# Patient Record
Sex: Female | Born: 1935 | Race: White | Hispanic: No | Smoking: Former smoker
Health system: Southern US, Community
[De-identification: ages and names within clinical notes are randomized; demographics above are authoritative.]

## PROBLEM LIST (undated history)

## (undated) DIAGNOSIS — M549 Dorsalgia, unspecified: Secondary | ICD-10-CM

## (undated) DIAGNOSIS — N8110 Cystocele, unspecified: Secondary | ICD-10-CM

## (undated) DIAGNOSIS — H35039 Hypertensive retinopathy, unspecified eye: Secondary | ICD-10-CM

## (undated) DIAGNOSIS — K219 Gastro-esophageal reflux disease without esophagitis: Secondary | ICD-10-CM

## (undated) DIAGNOSIS — K649 Unspecified hemorrhoids: Secondary | ICD-10-CM

## (undated) DIAGNOSIS — T7840XA Allergy, unspecified, initial encounter: Secondary | ICD-10-CM

## (undated) DIAGNOSIS — N289 Disorder of kidney and ureter, unspecified: Secondary | ICD-10-CM

## (undated) DIAGNOSIS — K8020 Calculus of gallbladder without cholecystitis without obstruction: Secondary | ICD-10-CM

## (undated) DIAGNOSIS — M1990 Unspecified osteoarthritis, unspecified site: Secondary | ICD-10-CM

## (undated) DIAGNOSIS — H3530 Unspecified macular degeneration: Secondary | ICD-10-CM

## (undated) DIAGNOSIS — K5792 Diverticulitis of intestine, part unspecified, without perforation or abscess without bleeding: Secondary | ICD-10-CM

## (undated) DIAGNOSIS — N3021 Other chronic cystitis with hematuria: Secondary | ICD-10-CM

## (undated) DIAGNOSIS — R7301 Impaired fasting glucose: Secondary | ICD-10-CM

## (undated) DIAGNOSIS — D034 Melanoma in situ of scalp and neck: Secondary | ICD-10-CM

## (undated) DIAGNOSIS — E785 Hyperlipidemia, unspecified: Secondary | ICD-10-CM

## (undated) DIAGNOSIS — K259 Gastric ulcer, unspecified as acute or chronic, without hemorrhage or perforation: Secondary | ICD-10-CM

## (undated) DIAGNOSIS — K635 Polyp of colon: Secondary | ICD-10-CM

## (undated) DIAGNOSIS — I10 Essential (primary) hypertension: Secondary | ICD-10-CM

## (undated) DIAGNOSIS — K2970 Gastritis, unspecified, without bleeding: Secondary | ICD-10-CM

## (undated) DIAGNOSIS — H9190 Unspecified hearing loss, unspecified ear: Secondary | ICD-10-CM

## (undated) HISTORY — PX: CHOLECYSTECTOMY: SHX55

## (undated) HISTORY — DX: Unspecified macular degeneration: H35.30

## (undated) HISTORY — PX: EYE SURGERY: SHX253

## (undated) HISTORY — DX: Essential (primary) hypertension: I10

## (undated) HISTORY — DX: Cystocele, unspecified: N81.10

## (undated) HISTORY — DX: Hypertensive retinopathy, unspecified eye: H35.039

## (undated) HISTORY — PX: THROAT SURGERY: SHX803

## (undated) HISTORY — DX: Allergy, unspecified, initial encounter: T78.40XA

## (undated) HISTORY — DX: Melanoma in situ of scalp and neck: D03.4

## (undated) HISTORY — DX: Impaired fasting glucose: R73.01

## (undated) HISTORY — PX: APPENDECTOMY: SHX54

## (undated) HISTORY — DX: Gastritis, unspecified, without bleeding: K29.70

## (undated) HISTORY — DX: Disorder of kidney and ureter, unspecified: N28.9

## (undated) HISTORY — DX: Calculus of gallbladder without cholecystitis without obstruction: K80.20

## (undated) HISTORY — DX: Dorsalgia, unspecified: M54.9

## (undated) HISTORY — PX: TONSILLECTOMY: SUR1361

## (undated) HISTORY — DX: Unspecified osteoarthritis, unspecified site: M19.90

## (undated) HISTORY — DX: Gastric ulcer, unspecified as acute or chronic, without hemorrhage or perforation: K25.9

## (undated) HISTORY — PX: CATARACT EXTRACTION: SUR2

## (undated) HISTORY — DX: Hyperlipidemia, unspecified: E78.5

## (undated) HISTORY — DX: Other chronic cystitis with hematuria: N30.21

## (undated) HISTORY — DX: Unspecified hearing loss, unspecified ear: H91.90

## (undated) HISTORY — DX: Polyp of colon: K63.5

## (undated) HISTORY — DX: Unspecified hemorrhoids: K64.9

---

## 2018-01-19 ENCOUNTER — Emergency Department (HOSPITAL_COMMUNITY): Payer: Medicare Other

## 2018-01-19 ENCOUNTER — Emergency Department (HOSPITAL_COMMUNITY)
Admission: EM | Admit: 2018-01-19 | Discharge: 2018-01-19 | Disposition: A | Discharge status: Home / Self Care | Payer: Medicare Other | Attending: Emergency Medicine | Admitting: Emergency Medicine

## 2018-01-19 ENCOUNTER — Encounter (HOSPITAL_COMMUNITY): Payer: Self-pay | Admitting: Emergency Medicine

## 2018-01-19 DIAGNOSIS — R0789 Other chest pain: Secondary | ICD-10-CM | POA: Diagnosis not present

## 2018-01-19 DIAGNOSIS — R079 Chest pain, unspecified: Secondary | ICD-10-CM

## 2018-01-19 HISTORY — DX: Hyperlipidemia, unspecified: E78.5

## 2018-01-19 HISTORY — DX: Gastro-esophageal reflux disease without esophagitis: K21.9

## 2018-01-19 HISTORY — DX: Diverticulitis of intestine, part unspecified, without perforation or abscess without bleeding: K57.92

## 2018-01-19 LAB — BASIC METABOLIC PANEL
ANION GAP: 10 (ref 5–15)
BUN: 13 mg/dL (ref 6–20)
CALCIUM: 9.3 mg/dL (ref 8.9–10.3)
CO2: 25 mmol/L (ref 22–32)
CREATININE: 0.69 mg/dL (ref 0.44–1.00)
Chloride: 106 mmol/L (ref 101–111)
Glucose, Bld: 119 mg/dL — ABNORMAL HIGH (ref 65–99)
Potassium: 4.2 mmol/L (ref 3.5–5.1)
Sodium: 141 mmol/L (ref 135–145)

## 2018-01-19 LAB — CBC
HCT: 41.1 % (ref 36.0–46.0)
HEMOGLOBIN: 14.2 g/dL (ref 12.0–15.0)
MCH: 29.3 pg (ref 26.0–34.0)
MCHC: 34.5 g/dL (ref 30.0–36.0)
MCV: 84.9 fL (ref 78.0–100.0)
PLATELETS: 211 10*3/uL (ref 150–400)
RBC: 4.84 MIL/uL (ref 3.87–5.11)
RDW: 13 % (ref 11.5–15.5)
WBC: 7.2 10*3/uL (ref 4.0–10.5)

## 2018-01-19 LAB — HEPATIC FUNCTION PANEL
ALBUMIN: 3.9 g/dL (ref 3.5–5.0)
ALT: 14 U/L (ref 14–54)
AST: 18 U/L (ref 15–41)
Alkaline Phosphatase: 81 U/L (ref 38–126)
Bilirubin, Direct: 0.1 mg/dL (ref 0.1–0.5)
Indirect Bilirubin: 0.6 mg/dL (ref 0.3–0.9)
Total Bilirubin: 0.7 mg/dL (ref 0.3–1.2)
Total Protein: 7.2 g/dL (ref 6.5–8.1)

## 2018-01-19 LAB — I-STAT TROPONIN, ED
TROPONIN I, POC: 0 ng/mL (ref 0.00–0.08)
TROPONIN I, POC: 0 ng/mL (ref 0.00–0.08)

## 2018-01-19 LAB — D-DIMER, QUANTITATIVE (NOT AT ARMC): D DIMER QUANT: 0.48 ug{FEU}/mL (ref 0.00–0.50)

## 2018-01-19 LAB — LIPASE, BLOOD: LIPASE: 19 U/L (ref 11–51)

## 2018-01-19 NOTE — ED Triage Notes (Signed)
Per pt, states yesterday, she was eating some chips and reading a book when she started having epigastric pain-states she has a history of GERD and thought it was indigestion during incident she started burping-no N/V-no radiation, SOB, dizziness-states she had a cardiac work up last year and all was negative-no pain at this time-6/10 pain when it incident occured

## 2018-01-19 NOTE — Discharge Instructions (Addendum)
The tests we did today were reassuring for lack of heart or lung problems causing your discomfort today.  It is possible that this is related to heartburn or esophagitis.  To treat this try taking Zantac twice a day along with an antacid like Maalox before meals and at bedtime for 2-3 weeks.  If your discomfort worsens or you have other symptoms of concern return here for reevaluation.  Also it will be a good idea to follow-up with your primary care doctor after your return home in 2 or 3 weeks.

## 2018-01-19 NOTE — ED Notes (Signed)
Bed: WA09 Expected date:  Expected time:  Means of arrival:  Comments: Triage 5 

## 2018-01-19 NOTE — ED Provider Notes (Signed)
[facility] DEPT Provider Note   CSN: [id number] Arrival date & time: 01/19/18  1021     History   Chief Complaint Chief Complaint  Patient presents with  . Chest Pain    HPI Yolanda Burke is a 84 y.o. female.  HPI Patient states that she was sitting in bed reading a book and eating chips when she began having lower chest pain.  States the pain was worse with deep breathing.  Eventually resolved.  Patient then had the pain this morning with minimal exertion.  Denied shortness of breath.  Denying nausea or vomiting.  Currently the patient is chest pain-free.  Denies any new lower extremity swelling or pain. Past Medical History:  Diagnosis Date  . Diverticulitis   . GERD (gastroesophageal reflux disease)   . Hyperlipidemia     There are no active problems to display for this patient.     OB History   None      Home Medications    Prior to Admission medications   Medication Sig Start Date End Date Taking? Authorizing Provider  Multiple Vitamins-Minerals (ICAPS AREDS 2 PO) Take 1 tablet by mouth 2 (two) times daily.   Yes [provider]  pravastatin (PRAVACHOL) 20 MG tablet Take 20 mg by mouth daily. 10/12/17  Yes [provider]  sodium chloride (OCEAN) 0.65 % SOLN nasal spray Place 1 spray into both nostrils as needed for congestion.   Yes [provider]  trimethoprim (TRIMPEX) 100 MG tablet Take 100 mg by mouth 2 (two) times daily. 11/06/17  Yes [provider]    Family History No family history on file.  Social History Social History   Tobacco Use  . Smoking status: Never Smoker  Substance Use Topics  . Alcohol use: Yes  . Drug use: Never     Allergies   Sulfa antibiotics   Review of Systems Review of Systems  Constitutional: Negative for chills, fatigue and fever.  HENT: Negative for congestion, sore throat and trouble swallowing.   Eyes: Negative for visual disturbance.    Respiratory: Negative for cough, shortness of breath and wheezing.   Cardiovascular: Positive for chest pain. Negative for palpitations and leg swelling.  Gastrointestinal: Negative for abdominal pain, constipation, diarrhea, nausea and vomiting.  Genitourinary: Negative for dysuria, flank pain and frequency.  Musculoskeletal: Negative for back pain, myalgias and neck pain.  Skin: Negative for rash and wound.  Neurological: Negative for dizziness, speech difficulty, weakness, light-headedness, numbness and headaches.  All other systems reviewed and are negative.    Physical Exam Updated Vital Signs BP (!) 151/75   Pulse 78   Temp 97.9 F (36.6 C)   Resp (!) 22   SpO2 97%   Physical Exam  Constitutional: She is oriented to person, place, and time. She appears well-developed and well-nourished. No distress.  HENT:  Head: Normocephalic and atraumatic.  Mouth/Throat: Oropharynx is clear and moist. No oropharyngeal exudate.  Eyes: Pupils are equal, round, and reactive to light. EOM are normal.  Neck: Normal range of motion. Neck supple. No JVD present.  Cardiovascular: Normal rate and regular rhythm. Exam reveals no gallop and no friction rub.  No murmur heard. Pulmonary/Chest: Effort normal and breath sounds normal. No stridor. No respiratory distress. She has no wheezes. She has no rales. She exhibits no tenderness.  Abdominal: Soft. Bowel sounds are normal. There is no tenderness. There is no rebound and no guarding.  Musculoskeletal: Normal range of motion. She exhibits no  edema or tenderness.  No lower extremity swelling, asymmetry or tenderness.  Distal pulses are 2+.  Lymphadenopathy:    She has no cervical adenopathy.  Neurological: She is alert and oriented to person, place, and time.  Moves all extremities without focal deficit.  Sensation intact.  Skin: Skin is warm. Capillary refill takes less than 2 seconds. No rash noted. She is not diaphoretic. No erythema.   Psychiatric: She has a normal mood and affect. Her behavior is normal.  Nursing note and vitals reviewed.    ED Treatments / Results  Labs (all labs ordered are listed, but only abnormal results are displayed) Labs Reviewed  BASIC METABOLIC PANEL - Abnormal; Notable for the following components:      Result Value   Glucose, Bld 119 (*)    All other components within normal limits  CBC  HEPATIC FUNCTION PANEL  LIPASE, BLOOD  D-DIMER, QUANTITATIVE (NOT AT [facility])  I-STAT TROPONIN, ED  I-STAT TROPONIN, ED    EKG EKG Interpretation  Date/Time:  Tuesday January 19 2018 10:38:12 EDT Ventricular Rate:  80 PR Interval:    QRS Duration: 84 QT Interval:  387 QTC Calculation: 447 R Axis:   15 Text Interpretation:  Sinus rhythm Borderline prolonged PR interval Confirmed by Julianne Rice [phone]) on 01/19/2018 12:36:00 PM   Radiology No results found.  Procedures Procedures (including critical care time)  Medications Ordered in ED Medications - No data to display   Initial Impression / Assessment and Plan / ED Course  I have reviewed the triage vital signs and the nursing notes.  Pertinent labs & imaging results that were available during my care of the patient were reviewed by me and considered in my medical decision making (see chart for details).  Clinical Course as of Jan 27 704  Tue Jan 19, 2018  1501 D-dimer, quantitative (not at [facility]) [DY]    Clinical Course User Index [DY] Julianne Rice, MD    Signed out to oncoming emergency physician pending repeat troponin.  Final Clinical Impressions(s) / ED Diagnoses   Final diagnoses:  Atypical chest pain  Nonspecific chest pain    ED Discharge Orders    None       Julianne Rice, MD 01/26/18 [phone]

## 2018-01-19 NOTE — ED Provider Notes (Signed)
17: 15-patient evaluated at the request of Dr.Yelverton to evaluate after return of second troponin, and d-dimer, which are normal.  Vitals repeated with mild elevation of the respiratory rate and blood pressure from earlier.  At this time is comfortable.  She states she took a Zantac this morning because she thought the discomfort was heartburn.  She is thirsty now and drinking water without difficulty.  Findings discussed with the patient.  She is agreeable to a trial of gastric acid blocker and antacid, all findings discussed and questions were answered.   Daleen Bo, MD 01/19/18 [phone]

## 2019-04-25 ENCOUNTER — Encounter: Payer: Self-pay | Admitting: Internal Medicine

## 2019-05-25 ENCOUNTER — Encounter: Payer: Self-pay | Admitting: Internal Medicine

## 2019-05-25 ENCOUNTER — Non-Acute Institutional Stay: Payer: Medicare Other | Admitting: Internal Medicine

## 2019-05-25 ENCOUNTER — Other Ambulatory Visit: Payer: Self-pay

## 2019-05-25 VITALS — BP 118/70 | HR 73 | Temp 98.5°F | Ht 62.0 in | Wt 141.0 lb

## 2019-05-25 DIAGNOSIS — H9113 Presbycusis, bilateral: Secondary | ICD-10-CM

## 2019-05-25 DIAGNOSIS — N3946 Mixed incontinence: Secondary | ICD-10-CM

## 2019-05-25 DIAGNOSIS — H3530 Unspecified macular degeneration: Secondary | ICD-10-CM | POA: Diagnosis not present

## 2019-05-25 DIAGNOSIS — E7800 Pure hypercholesterolemia, unspecified: Secondary | ICD-10-CM | POA: Diagnosis not present

## 2019-05-25 DIAGNOSIS — Z20828 Contact with and (suspected) exposure to other viral communicable diseases: Secondary | ICD-10-CM

## 2019-05-25 DIAGNOSIS — Z85820 Personal history of malignant melanoma of skin: Secondary | ICD-10-CM

## 2019-05-25 DIAGNOSIS — Z87448 Personal history of other diseases of urinary system: Secondary | ICD-10-CM

## 2019-05-25 DIAGNOSIS — K5790 Diverticulosis of intestine, part unspecified, without perforation or abscess without bleeding: Secondary | ICD-10-CM | POA: Diagnosis not present

## 2019-05-25 DIAGNOSIS — Z20822 Contact with and (suspected) exposure to covid-19: Secondary | ICD-10-CM

## 2019-05-25 DIAGNOSIS — K219 Gastro-esophageal reflux disease without esophagitis: Secondary | ICD-10-CM

## 2019-05-25 MED ORDER — CEFADROXIL 500 MG PO CAPS: 500.0000 mg | ORAL_CAPSULE | Freq: Two times a day (BID) | ORAL | 1 refills | Status: DC

## 2019-05-25 NOTE — Progress Notes (Signed)
Provider:  Rexene Edison. Mariea Clonts, D.O., C.M.D. Location:  Occupational psychologist of Service:  Clinic (12)  Previous PCP: Gayland Curry, DO Patient Care Team: Gayland Curry, DO as PCP - General (Geriatric Medicine)  Extended Emergency Contact Information Primary Emergency Contact: Althea Grimmer III Address: [address]          [place], [place] [address] Johnnette Litter of [place] Mobile Phone: [phone] Relation: Son  Code Status: Reported having a DNR but when I showed her the form, she did not have it so we completed one to be filed   Goals of Care: Advanced Directive information Advanced Directives 05/30/2019  Does Patient Have a Medical Advance Directive? Yes  Type of Paramedic of [place];Living will;Out of facility DNR (pink MOST or yellow form)  Does patient want to make changes to medical advance directive? Yes (MAU/Ambulatory/Procedural Areas - Information given)  Copy of [place] in Chart? No - copy requested  Pre-existing out of facility DNR order (yellow form or pink MOST form) Yellow form placed in chart (order not valid for inpatient use)      Chief Complaint  Patient presents with  . Establish Care    New Patient    HPI: Patient is a 85 y.o. female with h/o macular degeneration, hyperlipidemia, GERD and diverticulitis seen today to establish with [facility].  Records will be requested from her prior PCP but pt had not signed the record release form.    She moved here from [place] in February.  She is originally from [place].  She moved to [place] when her husband was a Insurance claims handler.  Her son lives here in [place].  He is an Optometrist.  Her husband passed away two years ago.  She had a full cardiac workup with a cardiologist last year.  She had NO heart problems.    Has abx for if she gets diverticulitis also.    She is doing very well.    She has three problems:   Diverticulosis with occasional diverticulitis attacks.  Cefodoxil was given to use as needed for diverticulitis.  She's kept it controlled.  Her GI specialist died.  She went to a new one last year and they advised against her antibiotics prn diverticulitis.  She's having regular bms now so she's stopped metamucil.  She had hematuria 10 years ago.  She sees urology annually.    She has macular degeneration.  She was getting floaters that were bad.  She has an appt with a doctor there as soon as her isolation is over.  She has moved here to [place] b/c of her eyes.  Moved here while she can still see and drive.  She has bought readers that she wears over her glasses if there's small print.  She's noticed a decline in her vision since she's moved here.  She saw Dr. Lowell Guitar in [place].      She is starting to have some bladder leakage.  She walks daily here, but she's started to wear pads regularly.  It's not a stream, but she does leak.  She saw urology in Jan before coming here.  Had no cancer on her urinalysis.  Has abx for if she gets a UTI--trimethoprim.  She does not have dysuria.  She's had some frequency today.  She plays golf and she leaks during that.  Discussed kegels and regular trips to the bathroom.  She'd been  to [employer] stayed in their cottage and ate out once in a private room.  Was with two sons and daughter in law.    She has had an audiology eval and hearing aid was recommended but she's not pursued that yet.    She uses antacid prn--may take one every couple of weeks--pepcid is the one option these days.  She has always had a weak stomach.  Takes pravachol for cholesterol.  She stopped taking it daily--had moved to every other day and cholesterol was still good.   She thinks it makes her joints hurt.    She had melanoma of her neck 20 years ago.  Had been following with derm annually and plans to see her derm in [place] once more when  she goes to play golf with friends later this summer.  Past Medical History:  Diagnosis Date  . Diverticulitis   . GERD (gastroesophageal reflux disease)   . Hyperlipidemia   . Macular degeneration   . Melanoma in situ of neck [facility])    Past Surgical History:  Procedure Laterality Date  . CHOLECYSTECTOMY    . TONSILLECTOMY      Social History   Socioeconomic History  . Marital status: Widowed    Spouse name: Not on file  . Number of children: Not on file  . Years of education: Not on file  . Highest education level: Not on file  Occupational History  . Not on file  Social Needs  . Financial resource strain: Not on file  . Food insecurity    Worry: Not on file    Inability: Not on file  . Transportation needs    Medical: Not on file    Non-medical: Not on file  Tobacco Use  . Smoking status: Never Smoker  . Smokeless tobacco: Never Used  Substance and Sexual Activity  . Alcohol use: Yes  . Drug use: Never  . Sexual activity: Not Currently  Lifestyle  . Physical activity    Days per week: Not on file    Minutes per session: Not on file  . Stress: Not on file  Relationships  . Social Herbalist on phone: Not on file    Gets together: Not on file    Attends religious service: Not on file    Active member of club or organization: Not on file    Attends meetings of clubs or organizations: Not on file    Relationship status: Not on file  Other Topics Concern  . Not on file  Social History Narrative   Tobacco use, amount per day now: NONE   Past tobacco use, amount per day: TEN   How many years did you use tobacco: 30 YEARS   Alcohol use (drinks per week): 1 OR 2   Diet: REGULAR LOTS OF VEGGIES   Do you drink/eat things with caffeine: TRY NOT   Marital status:      WIDOW                            What year were you married?  1959   Do you live in a house, apartment, assisted living, condo, trailer, etc.? [place]   Is it one or more stories? 1   How  many persons live in your home?  1   Do you have pets in your home?( please list) NO   Current or past profession: ORGANIST   Do you  exercise?       YES                           Type and how often? WALK EVERYDAY, GOLF   Do you have a living will? YES   Do you have a DNR form?       YES                            If not, do you want to discuss one?   Do you have signed POA/HPOA forms?      YES                  If so, please bring to you appointment    reports that she has never smoked. She has never used smokeless tobacco. She reports current alcohol use. She reports that she does not use drugs.  Functional Status Survey:    Family History  Problem Relation Age of Onset  . Heart disease Mother   . Pneumonia Father   . Kidney failure Father   . Cancer Brother   . Heart disease Brother     Health Maintenance  Topic Date Due  . TETANUS/TDAP  11/04/1950  . DEXA SCAN  11/04/1998  . PNA vac Low Risk Adult (1 of 2 - PCV13) 11/04/1998  . INFLUENZA VACCINE  05/28/2019    Allergies  Allergen Reactions  . Oysters [Shellfish Allergy] Swelling  . Sulfa Antibiotics     CANNOT REMEMBER REACTION    Outpatient Encounter Medications as of 05/25/2019  Medication Sig  . Multiple Vitamins-Minerals (ICAPS AREDS 2 PO) Take 1 tablet by mouth 2 (two) times daily.  . pravastatin (PRAVACHOL) 20 MG tablet Take 20 mg by mouth daily.  . psyllium (METAMUCIL) 58.6 % packet Take 1 packet by mouth daily.  . sodium chloride (OCEAN) 0.65 % SOLN nasal spray Place 1 spray into both nostrils as needed for congestion.  Marland Kitchen trimethoprim (TRIMPEX) 100 MG tablet Take 100 mg by mouth 2 (two) times daily.  . cefadroxil (DURICEF) 500 MG capsule Take 1 capsule (500 mg total) by mouth 2 (two) times daily.   No facility-administered encounter medications on file as of 05/25/2019.     Review of Systems  Constitutional: Negative for chills, fever and malaise/fatigue.  HENT: Positive for hearing loss. Negative for  tinnitus.        Has dental bridge; chronic postnasal drainage  Eyes: Positive for blurred vision.       Dry eyes, wears glasses, macular degeneration, itching  Respiratory: Negative for cough and shortness of breath.   Cardiovascular: Negative for chest pain, palpitations and leg swelling.  Gastrointestinal: Negative for abdominal pain, blood in stool, constipation, diarrhea and melena.       Diverticulosis and h/o diverticulitis  Genitourinary:       Recurrent utis, urgency, incontinence, weak stream  Musculoskeletal: Positive for joint pain. Negative for falls.       Joint stiffness  Skin: Positive for itching.       Dry skin, h/o melanoma  Neurological: Negative for dizziness and loss of consciousness.  Endo/Heme/Allergies: Bruises/bleeds easily.  Psychiatric/Behavioral: Negative for depression and memory loss. The patient is not nervous/anxious.     Vitals:   05/25/19 1510  BP: 118/70  Pulse: 73  Temp: 98.5 F (36.9 C)  TempSrc: Oral  SpO2: 96%  Weight: 141 lb (64 kg)  Height:  5\' 2"  (1.575 m)   Body mass index is 25.79 kg/m. Physical Exam Vitals signs reviewed.  Constitutional:      General: She is not in acute distress.    Appearance: Normal appearance. She is normal weight. She is not ill-appearing, toxic-appearing or diaphoretic.  HENT:     Head: Normocephalic and atraumatic.     Right Ear: External ear normal. There is no impacted cerumen.     Left Ear: External ear normal. There is no impacted cerumen.     Nose:     Comments: Nose and mouth deferred due to covid masking Eyes:     Extraocular Movements: Extraocular movements intact.     Conjunctiva/sclera: Conjunctivae normal.     Pupils: Pupils are equal, round, and reactive to light.     Comments: Wears glasses  Neck:     Musculoskeletal: Neck supple. No neck rigidity or muscular tenderness.     Vascular: No carotid bruit.  Cardiovascular:     Rate and Rhythm: Normal rate and regular rhythm.     Pulses:  Normal pulses.     Heart sounds: Normal heart sounds.  Pulmonary:     Effort: Pulmonary effort is normal.     Breath sounds: No wheezing, rhonchi or rales.  Abdominal:     General: Bowel sounds are normal. There is no distension.     Palpations: Abdomen is soft. There is no mass.     Tenderness: There is no abdominal tenderness. There is no right CVA tenderness, left CVA tenderness, guarding or rebound.  Genitourinary:    Comments: No suprapubic tenderness Musculoskeletal: Normal range of motion.     Right lower leg: No edema.     Left lower leg: No edema.  Lymphadenopathy:     Cervical: No cervical adenopathy.  Skin:    General: Skin is warm and dry.     Capillary Refill: Capillary refill takes less than 2 seconds.     Comments: Evidence of sun exposure  Neurological:     General: No focal deficit present.     Mental Status: She is alert and oriented to person, place, and time.     Cranial Nerves: No cranial nerve deficit.     Sensory: No sensory deficit.     Motor: No weakness.     Gait: Gait normal.  Psychiatric:        Mood and Affect: Mood normal.        Behavior: Behavior normal.        Thought Content: Thought content normal.        Judgment: Judgment normal.     Labs reviewed:  Need records from prior PCP in [place]--release form returned to CMA to be given to medical records to obtain records Basic Metabolic Panel: No results for input(s): NA, K, CL, CO2, GLUCOSE, BUN, CREATININE, CALCIUM, MG, PHOS in the last 8760 hours. Liver Function Tests: No results for input(s): AST, ALT, ALKPHOS, BILITOT, PROT, ALBUMIN in the last 8760 hours. No results for input(s): LIPASE, AMYLASE in the last 8760 hours. No results for input(s): AMMONIA in the last 8760 hours. CBC: No results for input(s): WBC, NEUTROABS, HGB, HCT, MCV, PLT in the last 8760 hours. Cardiac Enzymes: No results for input(s): CKTOTAL, CKMB, CKMBINDEX, TROPONINI in the last 8760 hours. BNP: Invalid  input(s): POCBNP No results found for: HGBA1C No results found for: TSH  Assessment/Plan 1. Close Exposure to 2019 Novel Coronavirus -covid-19 test ordered due [place] the [place] -  requested test to decrease time in isolation  2. Diverticulosis -with h/o diverticulitis -she has historically used cefadoxil 500mg  po bid for 10 days with relief of symptoms -reminded importance of hydration and fiber when not having an episode of inflammation to prevent   3. Pure hypercholesterolemia - continue pravachol, but check FLP to see where she stands here -is active with walking and golfing regularly  4. Macular degeneration of both eyes, unspecified type -f/u with ophthalmology, retina specialist as planned   5. Gastroesophageal reflux disease without esophagitis -may use prn pepcid, try to avoid spicy foods, eating before bed and any other triggers she notices, elevate HOB  6. Personal history of malignant melanoma of skin -continue to see derm annually here due to this 20 yrs ago on her neck  7. Mixed stress and urge urinary incontinence -given kegel exercises and discussed trying to schedule trips to restroom to prevent urgency episodes, hydrate and avoid bladder irritatnts  8. Presbycusis of both ears -has had audiology eval in past w/ hearing aids recommended--discussed negative effects of postponing treatment her hearing loss--she understands and will consider a follow-up in this area  9. H/O hematuria -has been following annually with urology due to this prior finding, but has not had any known pathology  -she takes trimethoprim bid as UTI prophylaxis from her urologist  Labs/tests ordered: cbc, cmp, flp; covid test  [facility] Moise L. Arrington Bencomo, D.O. [facility] [address], [place] [address] Cell Phone (Mon-Fri 8am-5pm):  [phone] On Call:  [phone] & follow prompts after 5pm & weekends Office Phone:   [phone] Office Fax:  [phone]

## 2019-05-25 NOTE — Patient Instructions (Signed)

## 2019-05-26 ENCOUNTER — Other Ambulatory Visit: Payer: Self-pay

## 2019-05-26 DIAGNOSIS — Z20822 Contact with and (suspected) exposure to covid-19: Secondary | ICD-10-CM

## 2019-05-29 LAB — NOVEL CORONAVIRUS, NAA: SARS-CoV-2, NAA: NOT DETECTED

## 2019-05-30 ENCOUNTER — Telehealth: Payer: Self-pay | Admitting: *Deleted

## 2019-05-30 ENCOUNTER — Telehealth: Payer: Self-pay | Admitting: Internal Medicine

## 2019-05-30 DIAGNOSIS — Z20828 Contact with and (suspected) exposure to other viral communicable diseases: Secondary | ICD-10-CM | POA: Insufficient documentation

## 2019-05-30 DIAGNOSIS — E7800 Pure hypercholesterolemia, unspecified: Secondary | ICD-10-CM | POA: Insufficient documentation

## 2019-05-30 DIAGNOSIS — H9113 Presbycusis, bilateral: Secondary | ICD-10-CM | POA: Insufficient documentation

## 2019-05-30 DIAGNOSIS — Z87448 Personal history of other diseases of urinary system: Secondary | ICD-10-CM | POA: Insufficient documentation

## 2019-05-30 DIAGNOSIS — N3946 Mixed incontinence: Secondary | ICD-10-CM | POA: Insufficient documentation

## 2019-05-30 DIAGNOSIS — Z85820 Personal history of malignant melanoma of skin: Secondary | ICD-10-CM | POA: Insufficient documentation

## 2019-05-30 DIAGNOSIS — Z20822 Contact with and (suspected) exposure to covid-19: Secondary | ICD-10-CM | POA: Insufficient documentation

## 2019-05-30 NOTE — Telephone Encounter (Signed)
No voicemail, tried calling pt

## 2019-05-30 NOTE — Telephone Encounter (Signed)
Please notify patient that her covid test was negative.

## 2019-05-30 NOTE — Telephone Encounter (Signed)
Patient is calling back for results- informed she is negative for COVID. Patient had traveled and lives in a community- no symptoms. Patient advsied to follow up with PCP for changes

## 2019-06-09 LAB — BASIC METABOLIC PANEL
BUN: 20 (ref 4–21)
Creatinine: 0.8 (ref 0.5–1.1)
Glucose: 102
Potassium: 4.4 (ref 3.4–5.3)
Sodium: 141 (ref 137–147)

## 2019-06-09 LAB — HEPATIC FUNCTION PANEL
ALT: 11 (ref 7–35)
AST: 17 (ref 13–35)
Alkaline Phosphatase: 77 (ref 25–125)
Bilirubin, Total: 0.7

## 2019-06-09 LAB — CBC AND DIFFERENTIAL
HCT: 39 (ref 36–46)
Hemoglobin: 13.3 (ref 12.0–16.0)
Platelets: 204 (ref 150–399)
WBC: 5.5

## 2019-06-09 LAB — LIPID PANEL
Cholesterol: 171 (ref 0–200)
HDL: 50 (ref 35–70)
LDL Cholesterol: 88
Triglycerides: 167 — AB (ref 40–160)

## 2019-06-10 ENCOUNTER — Encounter: Payer: Self-pay | Admitting: *Deleted

## 2019-06-20 ENCOUNTER — Encounter: Payer: Self-pay | Admitting: Internal Medicine

## 2019-08-08 ENCOUNTER — Telehealth: Payer: Self-pay | Admitting: *Deleted

## 2019-08-08 NOTE — Telephone Encounter (Signed)
Ok to obtain--be sure it's truly a clean catch.

## 2019-08-08 NOTE — Telephone Encounter (Signed)
[facility] Notified and agreed.

## 2019-08-08 NOTE — Telephone Encounter (Signed)
Yolanda Burke with [facility] called requesting verbal order for U/A and Cx due to patient having UTI symptoms of Urgency and Frequency. Please Advise.

## 2019-08-12 ENCOUNTER — Telehealth: Payer: Self-pay | Admitting: *Deleted

## 2019-08-12 MED ORDER — CEFADROXIL 500 MG PO CAPS: 500.0000 mg | ORAL_CAPSULE | Freq: Two times a day (BID) | ORAL | 0 refills | Status: DC

## 2019-08-12 NOTE — Telephone Encounter (Signed)
When I investigated this yesterday after receiving the fax, I found that I'd sent the antibiotic as she'd requested to [facility] back in July so I thought she'd have it on hand.  The bacteria in her urine is susceptible to that antibiotic.  If she has not picked that one up, we can send again as I'm sure they no longer have it waiting for her.

## 2019-08-12 NOTE — Telephone Encounter (Signed)
Patient has not taken Antibiotic. Rx sent to [facility] notified.

## 2019-08-12 NOTE — Telephone Encounter (Signed)
Yolanda Burke with [facility] called and stated that she faxed a U/A and Cx yesterday. Was placed in Dr. Serafina Mitchell.  Patient is calling her wanting to know if an antibiotic was called in. Nurse stated that patient is getting impatient. Wants Rx faxed to [facility]. Please Advise.

## 2019-08-18 ENCOUNTER — Ambulatory Visit (INDEPENDENT_AMBULATORY_CARE_PROVIDER_SITE_OTHER): Payer: Medicare Other | Admitting: Nurse Practitioner

## 2019-08-18 ENCOUNTER — Other Ambulatory Visit: Payer: Self-pay

## 2019-08-18 DIAGNOSIS — Z0000 Encounter for general adult medical examination without abnormal findings: Secondary | ICD-10-CM | POA: Diagnosis not present

## 2019-08-18 NOTE — Progress Notes (Signed)
This service is provided via telemedicine  No vital signs collected/recorded due to the encounter was a telemedicine visit.   Location of patient (ex: home, work):  Home  Patient consents to a telephone visit:  Yes  Location of the provider (ex: office, home):  [facility]  Name of any referring provider:  Hollace Kinnier, DO  Names of all persons participating in the telemedicine service and their role in the encounter:  Bonney Leitz, [place]; Sherrie Mustache, NP; patient.  .Time spent on call: 7.15   CMA time only

## 2019-08-18 NOTE — Patient Instructions (Signed)
Yolanda Burke , Thank you for taking time to come for your Medicare Wellness Visit. I appreciate your ongoing commitment to your health goals. Please review the following plan we discussed and let me know if I can assist you in the future.   Screening recommendations/referrals: Colonoscopy- aged out Mammogram- aged out Bone Density- you have declined exam.  Recommended yearly ophthalmology/optometry visit for glaucoma screening and checkup Recommended yearly dental visit for hygiene and checkup  Vaccinations: Influenza vaccine- up to date Pneumococcal vaccine-you have declined all pneumonia vaccines  Tdap vaccine- to go to pharmacy and get updated Tdap  Shingles vaccine- recommend going to local pharmacy and getting [facility] vaccine  Advanced directives: on file  Conditions/risks identified: progressive vision loss due to macular degeneration.    Next appointment: 1 year   Preventive Care 29 Years and Older, Female Preventive care refers to lifestyle choices and visits with your health care provider that can promote health and wellness. What does preventive care include?  A yearly physical exam. This is also called an annual well check.  Dental exams once or twice a year.  Routine eye exams. Ask your health care provider how often you should have your eyes checked.  Personal lifestyle choices, including:  Daily care of your teeth and gums.  Regular physical activity.  Eating a healthy diet.  Avoiding tobacco and drug use.  Limiting alcohol use.  Practicing safe sex.  Taking low-dose aspirin every day.  Taking vitamin and mineral supplements as recommended by your health care provider. What happens during an annual well check? The services and screenings done by your health care provider during your annual well check will depend on your age, overall health, lifestyle risk factors, and family history of disease. Counseling  Your health care provider may ask you  questions about your:  Alcohol use.  Tobacco use.  Drug use.  Emotional well-being.  Home and relationship well-being.  Sexual activity.  Eating habits.  History of falls.  Memory and ability to understand (cognition).  Work and work Statistician.  Reproductive health. Screening  You may have the following tests or measurements:  Height, weight, and BMI.  Blood pressure.  Lipid and cholesterol levels. These may be checked every 5 years, or more frequently if you are over 11 years old.  Skin check.  Lung cancer screening. You may have this screening every year starting at age 73 if you have a 30-pack-year history of smoking and currently smoke or have quit within the past 15 years.  Fecal occult blood test (FOBT) of the stool. You may have this test every year starting at age 32.  Flexible sigmoidoscopy or colonoscopy. You may have a sigmoidoscopy every 5 years or a colonoscopy every 10 years starting at age 3.  Hepatitis C blood test.  Hepatitis B blood test.  Sexually transmitted disease (STD) testing.  Diabetes screening. This is done by checking your blood sugar (glucose) after you have not eaten for a while (fasting). You may have this done every 1-3 years.  Bone density scan. This is done to screen for osteoporosis. You may have this done starting at age 2.  Mammogram. This may be done every 1-2 years. Talk to your health care provider about how often you should have regular mammograms. Talk with your health care provider about your test results, treatment options, and if necessary, the need for more tests. Vaccines  Your health care provider may recommend certain vaccines, such as:  Influenza vaccine. This is recommended  every year.  Tetanus, diphtheria, and acellular pertussis (Tdap, Td) vaccine. You may need a Td booster every 10 years.  Zoster vaccine. You may need this after age 50.  Pneumococcal 13-valent conjugate (PCV13) vaccine. One dose is  recommended after age 14.  Pneumococcal polysaccharide (PPSV23) vaccine. One dose is recommended after age 61. Talk to your health care provider about which screenings and vaccines you need and how often you need them. This information is not intended to replace advice given to you by your health care provider. Make sure you discuss any questions you have with your health care provider. Document Released: 11/09/2015 Document Revised: 07/02/2016 Document Reviewed: 08/14/2015 Elsevier Interactive Patient Education  2017 [place] Prevention in the Home Falls can cause injuries. They can happen to people of all ages. There are many things you can do to make your home safe and to help prevent falls. What can I do on the outside of my home?  Regularly fix the edges of walkways and driveways and fix any cracks.  Remove anything that might make you trip as you walk through a door, such as a raised step or threshold.  Trim any bushes or trees on the path to your home.  Use bright outdoor lighting.  Clear any walking paths of anything that might make someone trip, such as rocks or tools.  Regularly check to see if handrails are loose or broken. Make sure that both sides of any steps have handrails.  Any raised decks and porches should have guardrails on the edges.  Have any leaves, snow, or ice cleared regularly.  Use sand or salt on walking paths during winter.  Clean up any spills in your garage right away. This includes oil or grease spills. What can I do in the bathroom?  Use night lights.  Install grab bars by the toilet and in the tub and shower. Do not use towel bars as grab bars.  Use non-skid mats or decals in the tub or shower.  If you need to sit down in the shower, use a plastic, non-slip stool.  Keep the floor dry. Clean up any water that spills on the floor as soon as it happens.  Remove soap buildup in the tub or shower regularly.  Attach bath mats  securely with double-sided non-slip rug tape.  Do not have throw rugs and other things on the floor that can make you trip. What can I do in the bedroom?  Use night lights.  Make sure that you have a light by your bed that is easy to reach.  Do not use any sheets or blankets that are too big for your bed. They should not hang down onto the floor.  Have a firm chair that has side arms. You can use this for support while you get dressed.  Do not have throw rugs and other things on the floor that can make you trip. What can I do in the kitchen?  Clean up any spills right away.  Avoid walking on wet floors.  Keep items that you use a lot in easy-to-reach places.  If you need to reach something above you, use a strong step stool that has a grab bar.  Keep electrical cords out of the way.  Do not use floor polish or wax that makes floors slippery. If you must use wax, use non-skid floor wax.  Do not have throw rugs and other things on the floor that can make you trip. What  can I do with my stairs?  Do not leave any items on the stairs.  Make sure that there are handrails on both sides of the stairs and use them. Fix handrails that are broken or loose. Make sure that handrails are as long as the stairways.  Check any carpeting to make sure that it is firmly attached to the stairs. Fix any carpet that is loose or worn.  Avoid having throw rugs at the top or bottom of the stairs. If you do have throw rugs, attach them to the floor with carpet tape.  Make sure that you have a light switch at the top of the stairs and the bottom of the stairs. If you do not have them, ask someone to add them for you. What else can I do to help prevent falls?  Wear shoes that:  Do not have high heels.  Have rubber bottoms.  Are comfortable and fit you well.  Are closed at the toe. Do not wear sandals.  If you use a stepladder:  Make sure that it is fully opened. Do not climb a closed  stepladder.  Make sure that both sides of the stepladder are locked into place.  Ask someone to hold it for you, if possible.  Clearly mark and make sure that you can see:  Any grab bars or handrails.  First and last steps.  Where the edge of each step is.  Use tools that help you move around (mobility aids) if they are needed. These include:  Canes.  Walkers.  Scooters.  Crutches.  Turn on the lights when you go into a dark area. Replace any light bulbs as soon as they burn out.  Set up your furniture so you have a clear path. Avoid moving your furniture around.  If any of your floors are uneven, fix them.  If there are any pets around you, be aware of where they are.  Review your medicines with your doctor. Some medicines can make you feel dizzy. This can increase your chance of falling. Ask your doctor what other things that you can do to help prevent falls. This information is not intended to replace advice given to you by your health care provider. Make sure you discuss any questions you have with your health care provider. Document Released: 08/09/2009 Document Revised: 03/20/2016 Document Reviewed: 11/17/2014 Elsevier Interactive Patient Education  2017 [employer].

## 2019-08-18 NOTE — Progress Notes (Signed)
Subjective:   Yolanda Burke is a 85 y.o. female who presents for Medicare Annual (Subsequent) preventive examination.  Review of Systems:   Cardiac Risk Factors include: advanced age (>82men, >56 women)     Objective:     Vitals: There were no vitals taken for this visit.  There is no height or weight on file to calculate BMI.  Advanced Directives 08/18/2019 05/30/2019  Does Patient Have a Medical Advance Directive? Yes Yes  Type of Paramedic of [place];Living will;Out of facility DNR (pink MOST or yellow form) [place];Living will;Out of facility DNR (pink MOST or yellow form)  Does patient want to make changes to medical advance directive? No - Patient declined Yes (MAU/Ambulatory/Procedural Areas - Information given)  Copy of [place] in Chart? No - copy requested No - copy requested  Pre-existing out of facility DNR order (yellow form or pink MOST form) Yellow form placed in chart (order not valid for inpatient use) Yellow form placed in chart (order not valid for inpatient use)    Tobacco Social History   Tobacco Use  Smoking Status Never Smoker  Smokeless Tobacco Never Used     Counseling given: Not Answered   Clinical Intake:  Pre-visit preparation completed: Yes  Pain : No/denies pain     BMI - recorded: 25.79 Nutritional Status: BMI 25 -29 Overweight Nutritional Risks: None Diabetes: No  How often do you need to have someone help you when you read instructions, pamphlets, or other written materials from your doctor or pharmacy?: 1 - Never What is the last grade level you completed in school?: 4 year college  Interpreter Needed?: No     Past Medical History:  Diagnosis Date  . Diverticulitis   . GERD (gastroesophageal reflux disease)   . Hyperlipidemia   . Macular degeneration   . Melanoma in situ of neck [facility])    Past Surgical History:  Procedure Laterality Date  . CHOLECYSTECTOMY     . TONSILLECTOMY     Family History  Problem Relation Age of Onset  . Heart disease Mother   . Pneumonia Father   . Kidney failure Father   . Cancer Brother   . Heart disease Brother    Social History   Socioeconomic History  . Marital status: Widowed    Spouse name: Not on file  . Number of children: Not on file  . Years of education: Not on file  . Highest education level: Not on file  Occupational History  . Not on file  Social Needs  . Financial resource strain: Not on file  . Food insecurity    Worry: Not on file    Inability: Not on file  . Transportation needs    Medical: Not on file    Non-medical: Not on file  Tobacco Use  . Smoking status: Never Smoker  . Smokeless tobacco: Never Used  Substance and Sexual Activity  . Alcohol use: Yes  . Drug use: Never  . Sexual activity: Not Currently  Lifestyle  . Physical activity    Days per week: Not on file    Minutes per session: Not on file  . Stress: Not on file  Relationships  . Social Herbalist on phone: Not on file    Gets together: Not on file    Attends religious service: Not on file    Active member of club or organization: Not on file    Attends  meetings of clubs or organizations: Not on file    Relationship status: Not on file  Other Topics Concern  . Not on file  Social History Narrative   Tobacco use, amount per day now: NONE   Past tobacco use, amount per day: TEN   How many years did you use tobacco: 30 YEARS   Alcohol use (drinks per week): 1 OR 2   Diet: REGULAR LOTS OF VEGGIES   Do you drink/eat things with caffeine: TRY NOT   Marital status:      WIDOW                            What year were you married?  1959   Do you live in a house, apartment, assisted living, condo, trailer, etc.? [place]   Is it one or more stories? 1   How many persons live in your home?  1   Do you have pets in your home?( please list) NO   Current or past profession: ORGANIST   Do you exercise?        YES                           Type and how often? WALK EVERYDAY, GOLF   Do you have a living will? YES   Do you have a DNR form?       YES                            If not, do you want to discuss one?   Do you have signed POA/HPOA forms?      YES                  If so, please bring to you appointment    Outpatient Encounter Medications as of 08/18/2019  Medication Sig  . cefadroxil (DURICEF) 500 MG capsule Take 1 capsule (500 mg total) by mouth 2 (two) times daily.  . Multiple Vitamins-Minerals (ICAPS AREDS 2 PO) Take 1 tablet by mouth 2 (two) times daily.  . pravastatin (PRAVACHOL) 20 MG tablet Take 20 mg by mouth daily.  . sodium chloride (OCEAN) 0.65 % SOLN nasal spray Place 1 spray into both nostrils as needed for congestion.  . [DISCONTINUED] psyllium (METAMUCIL) 58.6 % packet Take 1 packet by mouth daily.  . [DISCONTINUED] trimethoprim (TRIMPEX) 100 MG tablet Take 100 mg by mouth 2 (two) times daily.   No facility-administered encounter medications on file as of 08/18/2019.     Activities of Daily Living In your present state of health, do you have any difficulty performing the following activities: 08/18/2019  Hearing? Y  Vision? Y  Comment macular degeneration  Difficulty concentrating or making decisions? N  Walking or climbing stairs? N  Dressing or bathing? N  Doing errands, shopping? N  Preparing Food and eating ? N  Using the Toilet? N  In the past six months, have you accidently leaked urine? Y  Do you have problems with loss of bowel control? N  Managing your Medications? N  Managing your Finances? N  Housekeeping or managing your Housekeeping? N  Some recent data might be hidden    Patient Care Team: Gayland Curry, DO as PCP - General (Geriatric Medicine)    Assessment:   This is a routine wellness examination for Yolanda Burke.  Exercise Activities and Dietary  recommendations Current Exercise Habits: Home exercise routine, Type of exercise: walking,  Time (Minutes): 60, Frequency (Times/Week): 7, Weekly Exercise (Minutes/Week): 420, Intensity: Moderate  Goals   None     Fall Risk Fall Risk  08/18/2019 05/25/2019  Falls in the past year? 0 0  Number falls in past yr: - 0  Injury with Fall? - 0   Is the patient's home free of loose throw rugs in walkways, pet beds, electrical cords, etc?   yes      Grab bars in the bathroom? yes      Handrails on the stairs?   no stairs      Adequate lighting?   yes  Timed Get Up and Go performed: na  Depression Screen PHQ 2/9 Scores 08/18/2019 05/25/2019  PHQ - 2 Score 0 0     Cognitive Function     6CIT Screen 08/18/2019  What Year? 0 points  What month? 0 points  What time? 3 points  Count back from 20 0 points  Months in reverse 0 points  Repeat phrase 0 points  Total Score 3    Immunization History  Administered Date(s) Administered  . Influenza, High Dose Seasonal PF 08/05/2019  . Influenza-Unspecified 10/27/2017    Qualifies for Shingles Vaccine?yes   Screening Tests Health Maintenance  Topic Date Due  . TETANUS/TDAP  11/04/1952  . DEXA SCAN  11/04/1998  . PNA vac Low Risk Adult (1 of 2 - PCV13) 11/04/1998  . INFLUENZA VACCINE  Completed    Cancer Screenings: Lung: Low Dose CT Chest recommended if Age 68-80 years, 30 pack-year currently smoking OR have quit w/in 15years. Patient does not qualify. Breast:  Up to date on Mammogram?aged out Up to date of Bone Density/Dexa? No declined  Colorectal: aged out  Additional Screenings: Hepatitis C Screening: na     Plan:      I have personally reviewed and noted the following in the patient's chart:   . Medical and social history . Use of alcohol, tobacco or illicit drugs  . Current medications and supplements . Functional ability and status . Nutritional status . Physical activity . Advanced directives . List of other physicians . Hospitalizations, surgeries, and ER visits in previous 12 months . Vitals .  Screenings to include cognitive, depression, and falls . Referrals and appointments  In addition, I have reviewed and discussed with patient certain preventive protocols, quality metrics, and best practice recommendations. A written personalized care plan for preventive services as well as general preventive health recommendations were provided to patient.     Lauree Chandler, NP  08/18/2019

## 2019-08-25 ENCOUNTER — Other Ambulatory Visit: Payer: Self-pay | Admitting: *Deleted

## 2019-08-25 MED ORDER — PRAVASTATIN SODIUM 20 MG PO TABS: 20.0000 mg | ORAL_TABLET | Freq: Every day | ORAL | 1 refills | Status: DC

## 2019-08-25 NOTE — Telephone Encounter (Signed)
Patient requested refill. Faxed.  

## 2019-08-25 NOTE — Addendum Note (Signed)
Addended by: Logan Bores on: 08/25/2019 01:48 PM   Modules accepted: Orders

## 2019-11-23 ENCOUNTER — Encounter: Payer: Self-pay | Admitting: Internal Medicine

## 2019-11-23 ENCOUNTER — Other Ambulatory Visit: Payer: Self-pay

## 2019-11-23 ENCOUNTER — Non-Acute Institutional Stay: Payer: Medicare Other | Admitting: Internal Medicine

## 2019-11-23 VITALS — BP 118/76 | HR 69 | Temp 97.1°F | Ht 62.0 in | Wt 144.0 lb

## 2019-11-23 DIAGNOSIS — W19XXXA Unspecified fall, initial encounter: Secondary | ICD-10-CM

## 2019-11-23 DIAGNOSIS — H3530 Unspecified macular degeneration: Secondary | ICD-10-CM

## 2019-11-23 DIAGNOSIS — Z87448 Personal history of other diseases of urinary system: Secondary | ICD-10-CM

## 2019-11-23 DIAGNOSIS — M25562 Pain in left knee: Secondary | ICD-10-CM

## 2019-11-23 DIAGNOSIS — M1812 Unilateral primary osteoarthritis of first carpometacarpal joint, left hand: Secondary | ICD-10-CM

## 2019-11-23 DIAGNOSIS — K5790 Diverticulosis of intestine, part unspecified, without perforation or abscess without bleeding: Secondary | ICD-10-CM | POA: Diagnosis not present

## 2019-11-23 DIAGNOSIS — R498 Other voice and resonance disorders: Secondary | ICD-10-CM

## 2019-11-23 DIAGNOSIS — E7800 Pure hypercholesterolemia, unspecified: Secondary | ICD-10-CM | POA: Diagnosis not present

## 2019-11-23 DIAGNOSIS — N3946 Mixed incontinence: Secondary | ICD-10-CM | POA: Diagnosis not present

## 2019-11-23 NOTE — Progress Notes (Signed)
Location:  [facility] of Service:  Clinic (12)  Provider: Tiffiney Sparrow L. Mariea Clonts, D.O., C.M.D.  Code Status: DNR in vynca Goals of Care:  Advanced Directives 11/23/2019  Does Patient Have a Medical Advance Directive? Yes  Type of Advance Directive Out of facility DNR (pink MOST or yellow form)  Does patient want to make changes to medical advance directive? No - Patient declined  Copy of [place] in Chart? -  Pre-existing out of facility DNR order (yellow form or pink MOST form) -     Chief Complaint  Patient presents with  . Medical Management of Chronic Issues    6 month follow up l knee sore     HPI: Patient is a 86 y.o. female seen today for medical management of chronic diseases.    She was out walking at night when she shouldn't have.  She missed a step and fell.  She banged her knee.  It doesn't hurt to walk.  If she sits to read a book, the left one gets stiff and sore.  The right has an abrasion but doesn't hurt.  She even banged her head, but no bruise.  She did use ice on it and still is.  She has used some advil.  She did go see the nurse about her left base of her thumb.  She used the advil for a few days there, too.    Says something is going on with her bowels.  She had diverticulitis something like 4 years ago and her doctor had given her two powerful abx and her bowels have not been the same since.  They are loose (not diarrhea)--regular frequency.  She had been taking metamucil.  No abdominal pain or fever.  She had not been taking the metamucil now so we discussed resuming.  She's been twice to the retina specialist--floaters--had not changed as of December.  Has a little bit of a vocal tremor.  She says it's when she talks too much.  ENT said nothing was wrong there.  She is aware of it though.  She will occasionally get tremor of her hands.  She asks what are signs of parkinson's.    She got her first covid vaccine  with the group and has her second one on the 10th.    She has not had a UTI since she was treated last time for it.  She had been seeing a urologist.  She had hematuria 10 years ago or more.  That never recurred.  She did have one last check in feb--no cancer cells identified then.  Her brother had a cancer in the urologic area also and "had a bag"--he's still alive at 41.  She was having some incontinence last time.  She did not really notice that being better after the abx but it seems a little better.  More leakage if she sits a while--wears a pad.    MMSE - Mini Mental State Exam 11/23/2019  Orientation to time 5  Orientation to Place 5  Registration 3  Attention/ Calculation 5  Recall 3  Language- name 2 objects 2  Language- repeat 1  Language- follow 3 step command 3  Language- read & follow direction 1  Write a sentence 1  Copy design 1  Total score 30    Past Medical History:  Diagnosis Date  . Diverticulitis   . GERD (gastroesophageal reflux disease)   . Hyperlipidemia   . Macular degeneration   .  Melanoma in situ of neck [facility])     Past Surgical History:  Procedure Laterality Date  . CHOLECYSTECTOMY    . TONSILLECTOMY      Allergies  Allergen Reactions  . Oysters [Shellfish Allergy] Swelling  . Sulfa Antibiotics     CANNOT REMEMBER REACTION    Outpatient Encounter Medications as of 11/23/2019  Medication Sig  . cefadroxil (DURICEF) 500 MG capsule Take 1 capsule (500 mg total) by mouth 2 (two) times daily.  . Multiple Vitamins-Minerals (ICAPS AREDS 2 PO) Take 1 tablet by mouth 2 (two) times daily.  . pravastatin (PRAVACHOL) 20 MG tablet Take 1 tablet (20 mg total) by mouth daily.  . sodium chloride (OCEAN) 0.65 % SOLN nasal spray Place 1 spray into both nostrils as needed for congestion.   No facility-administered encounter medications on file as of 11/23/2019.    Review of Systems:  Review of Systems  Constitutional: Negative for chills, fever and  malaise/fatigue.  HENT: Negative for congestion and sore throat.   Eyes:       Glasses, floaters, sees retina specialist  Respiratory: Negative for shortness of breath.   Cardiovascular: Negative for chest pain, palpitations and leg swelling.  Gastrointestinal: Negative for abdominal pain, blood in stool, constipation and melena.       Loose stools  Genitourinary: Negative for dysuria.       Leakage of urine  Musculoskeletal: Positive for falls and joint pain.  Skin: Negative for itching and rash.  Neurological: Positive for tremors. Negative for loss of consciousness.       Vocal tremor  Endo/Heme/Allergies: Bruises/bleeds easily.  Psychiatric/Behavioral: Negative for depression and memory loss. The patient is not nervous/anxious and does not have insomnia.     Health Maintenance  Topic Date Due  . TETANUS/TDAP  11/04/1950  . DEXA SCAN  11/04/1998  . PNA vac Low Risk Adult (1 of 2 - PCV13) 11/04/1998  . INFLUENZA VACCINE  Completed    Physical Exam: Vitals:   11/23/19 0828  BP: 118/76  Pulse: 69  Temp: (!) 97.1 F (36.2 C)  TempSrc: Temporal  SpO2: 98%  Weight: 144 lb (65.3 kg)  Height: 5\' 2"  (1.575 m)   Body mass index is 26.34 kg/m. Physical Exam  Labs reviewed: Basic Metabolic Panel: Recent Labs    06/09/19 0000  NA 141  K 4.4  BUN 20  CREATININE 0.8   Liver Function Tests: Recent Labs    06/09/19 0000  AST 17  ALT 11  ALKPHOS 77   No results for input(s): LIPASE, AMYLASE in the last 8760 hours. No results for input(s): AMMONIA in the last 8760 hours. CBC: Recent Labs    06/09/19 0000  WBC 5.5  HGB 13.3  HCT 39  PLT 204   Lipid Panel: Recent Labs    06/09/19 0000  CHOL 171  HDL 50  LDLCALC 88  TRIG 167*   No results found for: HGBA1C  Assessment/Plan 1. Diverticulosis -resume metamucil use to bulk stools and prevent blockages and diverticulitis -hydrate well  2. Pure hypercholesterolemia -cont pravachol, healthy diet and try  to get back to walking as recover from fall and knee injury--don't walk at night  3. Macular degeneration of both eyes, unspecified type -continue to f/u with retina specialist twice a year  4. Mixed stress and urge urinary incontinence -improved, had been getting frequent UTIs and keep duricef on hand (previously prescribed by another PCP and was adamant about having this when she first saw me--has  not used)  5. H/O hematuria -years ago, will f/u UA when gets next labs due to family hx in brother of bladder ca   6. Fall, initial encounter -tripped when walking in the dark so is avoiding doing that which makes good sense, knee recovering  7. Left anterior knee pain -slowly recovering, may use ice, tylenol, topicals -avoid using nsaids for more than a few days at a time  8. Arthritis of carpometacarpal (CMC) joint of left thumb -we discussed that I could refer her to a hand ortho surgeon for injections if desired--she didn't really commit to wanting to go--happy to refer to Dr. Amedeo Plenty if she wishes--she will call me if it gets to that point  9. Vocal tremor -noted, does not have other tremors, check tsh, monitor for any other neurologic symptoms to suggest PD or other neurologic condition  Labs/tests ordered: cbc, cmp, flp, UA tsh Next appt:  6 mos med mgt  Cassidy Tashiro L. Latiffany Harwick, D.O. [facility] [address], [place] [address] Cell Phone (Mon-Fri 8am-5pm):  [phone] On Call:  [phone] & follow prompts after 5pm & weekends Office Phone:  [phone] Office Fax:  [phone]

## 2019-11-23 NOTE — Patient Instructions (Addendum)
Be careful with aleve and advil for more than a few days at a time.  You may continue with ice on your knee.  If your left thumb gets to hurting too much, let me know and I can refer you to the hand surgeon, Dr. Amedeo Plenty.  You may use tylenol for both your knee and your thumb.    Resume your metamucil to help bulk your stools.  Be sure to take about 2 hrs before a meal.

## 2019-11-25 ENCOUNTER — Encounter: Payer: Self-pay | Admitting: Internal Medicine

## 2020-02-08 ENCOUNTER — Other Ambulatory Visit: Payer: Self-pay

## 2020-02-08 ENCOUNTER — Encounter: Payer: Self-pay | Admitting: Internal Medicine

## 2020-02-08 ENCOUNTER — Non-Acute Institutional Stay: Payer: Medicare Other | Admitting: Internal Medicine

## 2020-02-08 VITALS — BP 122/72 | HR 77 | Temp 96.8°F | Ht 63.0 in | Wt 146.0 lb

## 2020-02-08 DIAGNOSIS — K219 Gastro-esophageal reflux disease without esophagitis: Secondary | ICD-10-CM | POA: Diagnosis not present

## 2020-02-08 DIAGNOSIS — K5792 Diverticulitis of intestine, part unspecified, without perforation or abscess without bleeding: Secondary | ICD-10-CM | POA: Diagnosis not present

## 2020-02-08 DIAGNOSIS — K5790 Diverticulosis of intestine, part unspecified, without perforation or abscess without bleeding: Secondary | ICD-10-CM

## 2020-02-08 NOTE — Progress Notes (Signed)
Location:  [place] of Service:  Clinic (12)  Provider: Nelwyn Hebdon L. Mariea Clonts, D.O., C.M.D.  Code Status: DNR Goals of Care:  Advanced Directives 02/08/2020  Does Patient Have a Medical Advance Directive? Yes  Type of Advance Directive Out of facility DNR (pink MOST or yellow form)  Does patient want to make changes to medical advance directive? No - Patient declined  Copy of [place] in Chart? -  Pre-existing out of facility DNR order (yellow form or pink MOST form) Pink MOST/Yellow Form most recent copy in chart - Physician notified to receive inpatient order     Chief Complaint  Patient presents with  . Acute Visit    Diverticulitis referral     HPI: Patient is a 86 y.o. female with h/o diverticulosis and prior diverticulitis (for which she'd insisted on an antibiotic to keep on hand which she's apparently used up), GERD, ulcers, hearing loss, macular degeneration, melanoma, urinary incontinence, osteoarthritis and vocal tremor seen today for an acute visit for diverticulitis requesting a referral to GI.  10 yrs ago, she had her first diverticultis attack.  Was told not to eat rregular foods and take cefodoxil Went to GI, had cscopes and some polyps 4 years ago, she had another episode--GI then gave her two antibiotics.  bms never the same.   Finally got a new GI and had stool sample that was ok and had endoscopy w/o ulcers at that point.  Has had ulcers though.  The PA there told her not to take an antibiotic when she had an attack.  Had attack Friday--changed herself to a liquid diet.  Pain was on left side, then stomach got upset and then pain got better.  Wonders if she should see GI here.  PA had told her to take pepcid if she had indigestion also.    Yesterday, she really felt good.  She worked in [employer] today on campus and sounds like she had some anxiety and now her stomach hurts again.  She vomited a lot as a child.  Anytime she is stressed  since "being old" her stomach hurts.  She is also bothered that her stools are thin like a finger.  She had taken metamucil at one point and that helped her stools some until this episodes of LLQ pain.    She has had a cheese sandwich.  She's also eaten peanut butter today.     Past Medical History:  Diagnosis Date  . Allergy   . Back pain   . Chronic cystitis with hematuria   . Cystocele, unspecified (CODE)   . Diverticulitis   . Dyslipidemia   . Gastritis   . GERD (gastroesophageal reflux disease)   . Hearing loss   . Hemorrhoids   . Hyperlipidemia   . Hypertension   . Impaired fasting glucose   . Macular degeneration   . Melanoma in situ of neck ([place])   . Renal insufficiency     Past Surgical History:  Procedure Laterality Date  . CHOLECYSTECTOMY    . TONSILLECTOMY      Allergies  Allergen Reactions  . Oysters [Shellfish Allergy] Swelling  . Sulfa Antibiotics     CANNOT REMEMBER REACTION    Outpatient Encounter Medications as of 02/08/2020  Medication Sig  . cefadroxil (DURICEF) 500 MG capsule Take 1 capsule (500 mg total) by mouth 2 (two) times daily.  . Multiple Vitamins-Minerals (ICAPS AREDS 2 PO) Take 1 tablet by mouth 2 (  two) times daily.  . pravastatin (PRAVACHOL) 20 MG tablet Take 1 tablet (20 mg total) by mouth daily.  . sodium chloride (OCEAN) 0.65 % SOLN nasal spray Place 1 spray into both nostrils as needed for congestion.   No facility-administered encounter medications on file as of 02/08/2020.    Review of Systems:  Review of Systems  Constitutional: Positive for malaise/fatigue. Negative for chills and fever.  HENT: Positive for hearing loss. Negative for congestion and sore throat.   Eyes: Negative for blurred vision.  Respiratory: Negative for cough and shortness of breath.   Cardiovascular: Negative for chest pain, palpitations and leg swelling.  Gastrointestinal: Positive for abdominal pain and nausea. Negative for blood in stool,  constipation, diarrhea, melena and vomiting.  Genitourinary: Positive for frequency and urgency. Negative for dysuria, flank pain and hematuria.       Chronic incontinence  Musculoskeletal: Positive for joint pain. Negative for falls.  Neurological: Positive for tremors.       Vocal tremor  Endo/Heme/Allergies: Bruises/bleeds easily.  Psychiatric/Behavioral: Negative for depression and memory loss. The patient is nervous/anxious. The patient does not have insomnia.     Health Maintenance  Topic Date Due  . TETANUS/TDAP  Never done  . DEXA SCAN  Never done  . PNA vac Low Risk Adult (1 of 2 - PCV13) Never done  . INFLUENZA VACCINE  05/27/2020    Physical Exam: Vitals:   02/08/20 1337  BP: 122/72  Pulse: 77  Temp: (!) 96.8 F (36 C)  TempSrc: Temporal  SpO2: 97%  Weight: 146 lb (66.2 kg)  Height: 5\' 3"  (1.6 m)   Body mass index is 25.86 kg/m. Physical Exam Constitutional:      General: She is not in acute distress.    Appearance: Normal appearance. She is not toxic-appearing.  HENT:     Head: Normocephalic and atraumatic.  Cardiovascular:     Rate and Rhythm: Normal rate and regular rhythm.     Pulses: Normal pulses.     Heart sounds: Normal heart sounds.  Pulmonary:     Effort: Pulmonary effort is normal.     Breath sounds: Normal breath sounds.  Abdominal:     General: Bowel sounds are normal. There is no distension.     Palpations: Abdomen is soft. There is no mass.     Tenderness: There is no abdominal tenderness. There is no guarding or rebound.  Musculoskeletal:        General: Normal range of motion.     Right lower leg: No edema.     Left lower leg: No edema.  Skin:    General: Skin is warm and dry.  Neurological:     General: No focal deficit present.     Mental Status: She is alert and oriented to person, place, and time.     Gait: Gait normal.     Comments: Vocal tremor  Psychiatric:        Behavior: Behavior normal.        Thought Content:  Thought content normal.        Judgment: Judgment normal.     Comments: Anxious and jittery today     Labs reviewed: Basic Metabolic Panel: Recent Labs    06/09/19 0000  NA 141  K 4.4  BUN 20  CREATININE 0.8   Liver Function Tests: Recent Labs    06/09/19 0000  AST 17  ALT 11  ALKPHOS 77   No results for input(s): LIPASE, AMYLASE in  the last 8760 hours. No results for input(s): AMMONIA in the last 8760 hours. CBC: Recent Labs    06/09/19 0000  WBC 5.5  HGB 13.3  HCT 39  PLT 204   Lipid Panel: Recent Labs    06/09/19 0000  CHOL 171  HDL 50  LDLCALC 88  TRIG 167*   No results found for: HGBA1C  Procedures since last visit: No results found.  Assessment/Plan 1. Acute diverticulitis - seems this has already been resolving on its own with her liquid diet -discussed advancing as tolerated and then going to a bland diet for a few weeks until she feels normal again, then return to high fiber diet to prevent recurrences - Ambulatory referral to Gastroenterology to be established in case of a problem in the future  2. Diverticulosis -ongoing -she describes several episodes of diverticulitis over the years, but sounds like she was not always fully evaluated, just given antibiotics many times -when she first established here she was insistent about getting that same antibiotic (which I'd never heard used during my years of training or practice) and I did give in at first, but did not want to continue that process  - Ambulatory referral to Gastroenterology due to this and change in stool caliber she describes (she is 36 with diverticular disease so not a good cscope candidate), but I'd like for a GI physician locally to know her case in the event of acute illness  3. Gastroesophageal reflux disease without esophagitis - has opted not to continue PPIs due to side effects like osteoporosis - Ambulatory referral to Gastroenterology  Labs/tests ordered:  GI  referral Next appt:  05/09/2020  Bethel Sirois L. Giulian Goldring, D.O. [facility] [address], [place] [address] Cell Phone (Mon-Fri 8am-5pm):  [phone] On Call:  [phone] & follow prompts after 5pm & weekends Office Phone:  [phone] Office Fax:  [phone]

## 2020-02-08 NOTE — Patient Instructions (Signed)
Bland Diet A bland diet consists of foods that are often soft and do not have a lot of fat, fiber, or extra seasonings. Foods without fat, fiber, or seasoning are easier for the body to digest. They are also less likely to irritate your mouth, throat, stomach, and other parts of your digestive system. A bland diet is sometimes called a BRAT diet. What is my plan? Your health care provider or food and nutrition specialist (dietitian) may recommend specific changes to your diet to prevent symptoms or to treat your symptoms. These changes may include:  Eating small meals often.  Cooking food until it is soft enough to chew easily.  Chewing your food well.  Drinking fluids slowly.  Not eating foods that are very spicy, sour, or fatty.  Not eating citrus fruits, such as oranges and grapefruit. What do I need to know about this diet?  Eat a variety of foods from the bland diet food list.  Do not follow a bland diet longer than needed.  Ask your health care provider whether you should take vitamins or supplements. What foods can I eat? Grains  Hot cereals, such as cream of wheat. Rice. Bread, crackers, or tortillas made from refined white flour. Vegetables Canned or cooked vegetables. Mashed or boiled potatoes. Fruits  Bananas. Applesauce. Other types of cooked or canned fruit with the skin and seeds removed, such as canned peaches or pears. Meats and other proteins  Scrambled eggs. Creamy peanut butter or other nut butters. Lean, well-cooked meats, such as chicken or fish. Tofu. Soups or broths. Dairy Low-fat dairy products, such as milk, cottage cheese, or yogurt. Beverages  Water. Herbal tea. Apple juice. Fats and oils Mild salad dressings. Canola or olive oil. Sweets and desserts Pudding. Custard. Fruit gelatin. Ice cream. The items listed above may not be a complete list of recommended foods and beverages. Contact a dietitian for more options. What foods are not  recommended? Grains Whole grain breads and cereals. Vegetables Raw vegetables. Fruits Raw fruits, especially citrus, berries, or dried fruits. Dairy Whole fat dairy foods. Beverages Caffeinated drinks. Alcohol. Seasonings and condiments Strongly flavored seasonings or condiments. Hot sauce. Salsa. Other foods Spicy foods. Fried foods. Sour foods, such as pickled or fermented foods. Foods with high sugar content. Foods high in fiber. The items listed above may not be a complete list of foods and beverages to avoid. Contact a dietitian for more information. Summary  A bland diet consists of foods that are often soft and do not have a lot of fat, fiber, or extra seasonings.  Foods without fat, fiber, or seasoning are easier for the body to digest.  Check with your health care provider to see how long you should follow this diet plan. It is not meant to be followed for long periods. This information is not intended to replace advice given to you by your health care provider. Make sure you discuss any questions you have with your health care provider. Document Revised: 11/11/2017 Document Reviewed: 11/11/2017 Elsevier Patient Education  2020 Elsevier Inc.  

## 2020-03-14 ENCOUNTER — Encounter: Payer: Self-pay | Admitting: Internal Medicine

## 2020-03-14 ENCOUNTER — Ambulatory Visit (INDEPENDENT_AMBULATORY_CARE_PROVIDER_SITE_OTHER): Payer: Medicare Other | Admitting: Internal Medicine

## 2020-03-14 VITALS — BP 140/66 | HR 80 | Ht 61.25 in | Wt 140.2 lb

## 2020-03-14 DIAGNOSIS — R194 Change in bowel habit: Secondary | ICD-10-CM

## 2020-03-14 DIAGNOSIS — R1013 Epigastric pain: Secondary | ICD-10-CM

## 2020-03-14 DIAGNOSIS — R1032 Left lower quadrant pain: Secondary | ICD-10-CM

## 2020-03-14 DIAGNOSIS — K5732 Diverticulitis of large intestine without perforation or abscess without bleeding: Secondary | ICD-10-CM | POA: Diagnosis not present

## 2020-03-14 MED ORDER — CIPROFLOXACIN HCL 500 MG PO TABS: 500.0000 mg | ORAL_TABLET | Freq: Two times a day (BID) | ORAL | 0 refills | Status: DC

## 2020-03-14 NOTE — Patient Instructions (Addendum)
If you are age 84 or older, your body mass index should be between 23-30. Your Body mass index is 26.28 kg/m. If this is out of the aforementioned range listed, please consider follow up with your Primary Care Provider.  If you are age 42 or younger, your body mass index should be between 19-25. Your Body mass index is 26.28 kg/m. If this is out of the aformentioned range listed, please consider follow up with your Primary Care Provider.   We have sent the following medications to your pharmacy for you to pick up at your convenience:  Cipro  Take Metamucil 1-2 tablespoons daily.  Call us if you feel like you are having a flare

## 2020-03-14 NOTE — Progress Notes (Signed)
HISTORY OF PRESENT ILLNESS:  Yolanda Burke is a delightful 86 y.o. female, golf enthusiast and current resident of [place] [facility], recently relocating to [place] from [place], who is sent today by her primary care provider Dr. Mariea Clonts regarding history of diverticulitis, dyspepsia, and change in bowel habits.  Patient states that she has had multiple colonoscopies over the years.  She is known to have diverticulosis and nonspecified polyps.  Her last examination, she states, was around age 32.  She is also had recurrent bouts of diverticulitis for which she has been on various antibiotics.  After being treated years ago, she reports that her bowel habits changed.  She reports occasional less formed stools or bowel movements that are in pieces.  She also reports occasional short-lived fleeting left lower quadrant discomfort which concerned her may represent diverticulitis.  Next, she reports intermittent problems with "upset stomach".  Dyspeptic type complaints.  She wonders if it is okay to take Pepcid on demand, which helps.  No classic reflux symptoms on a regular basis.  Review of outside records includes nursing home visit from Dr. Mariea Clonts, blood work from August 2020 with normal basic metabolic panel.  Normal hemoglobin 13.3.  No relevant x-rays.  Patient has been vaccinated against Covid.  She remains quite active  REVIEW OF SYSTEMS:  All non-GI ROS negative unless otherwise stated in the HPI except for arthritis, visual change (macular degeneration), hearing problems, urinary leakage  Past Medical History:  Diagnosis Date  . Allergy   . Arthritis   . Back pain   . Chronic cystitis with hematuria   . Colon polyps   . Cystocele, unspecified (CODE)   . Diverticulitis   . Dyslipidemia   . Gallstones   . Gastric ulcer   . Gastritis   . GERD (gastroesophageal reflux disease)   . Hearing loss   . Hemorrhoids   . Hyperlipidemia   . Hypertension   . Impaired fasting  glucose   . Macular degeneration   . Melanoma in situ of neck ([place])   . Renal insufficiency     Past Surgical History:  Procedure Laterality Date  . APPENDECTOMY    . CHOLECYSTECTOMY     with appendectomy  . THROAT SURGERY     calcified ligament  . TONSILLECTOMY      Social History Lulani Ruplinger  reports that she quit smoking about 35 years ago. Her smoking use included cigarettes. She has never used smokeless tobacco. She reports current alcohol use. She reports that she does not use drugs.  family history includes Bladder Cancer in her brother; Dementia in her mother; Heart disease in her brother; Heart failure in her mother; Kidney failure in her father; Pneumonia in her father.  Allergies  Allergen Reactions  . Oysters [Shellfish Allergy] Swelling  . Sulfa Antibiotics     CANNOT REMEMBER REACTION       PHYSICAL EXAMINATION: Vital signs: BP 140/66 (BP Location: Left Arm, Patient Position: Sitting, Cuff Size: Normal)   Pulse 80   Ht 5' 1.25" (1.556 m) Comment: height measured without shoes  Wt 140 lb 4 oz (63.6 kg)   BMI 26.28 kg/m   Constitutional: generally well-appearing, no acute distress Psychiatric: Pleasant, alert and oriented x3, cooperative Eyes: extraocular movements intact, anicteric, conjunctiva pink.  Unequal pupils Mouth: oral pharynx moist, no lesions Neck: supple no lymphadenopathy Cardiovascular: heart regular rate and rhythm, no murmur Lungs: clear to auscultation bilaterally Abdomen: soft, nontender, nondistended, no obvious ascites, no peritoneal signs,  normal bowel sounds, no organomegaly Rectal: Omitted Extremities: no clubbing, cyanosis, or lower extremity edema bilaterally Skin: no lesions on visible extremities Neuro: No focal deficits.  Cranial nerves intact  ASSESSMENT:  1.  History of diverticulitis.  No evidence of active disease at this time. 2.  Intermittent fleeting left lower quadrant pain likely secondary to diverticular  spasm. 3.  Change in bowel habits.  Nonspecific as described 4.  Multiple prior colonoscopies in the past 5.  Intermittent dyspepsia without alarm features   PLAN:  1.  Recommend Metamucil 1 to 2 tablespoons daily to improve bowel consistency in bowel habits. 2.  Long discussion today on diverticular disease.  Discussion of diverticular spasm.  Reassurance 3.  Prescribe ciprofloxacin 500 mg twice daily for 1 week.  She will not initiate this now but rather have the prescription on hand should she develop what seems like a more obvious bout of diverticulitis (I described to her what this might feel like).  I also told her that she needs to contact this office should she feel that she has diverticulitis and has decided to initiate antibiotic therapy.  She agrees.  I also told her that she should contact the office should she have any questions about any of her GI issues. 4.  Okay to take Pepcid on demand.  May take 1 every 6 hours as needed.  I reviewed with her medication risks. 5.  Otherwise, resume general medical care with PCP.  GI follow-up as needed A total time of 45 minutes was spent preparing to see the patient, reviewing test, obtaining comprehensive history, reviewing outside history, performing comprehensive physical examination, counseling the patient regarding her above listed issues, ordering medications and reviewing the risks, and documenting clinical information in the health record

## 2020-05-01 ENCOUNTER — Encounter: Payer: Self-pay | Admitting: Internal Medicine

## 2020-05-01 LAB — BASIC METABOLIC PANEL
BUN: 12 (ref 4–21)
CO2: 23 — AB (ref 13–22)
Chloride: 106 (ref 99–108)
Creatinine: 0.6 (ref 0.5–1.1)
Glucose: 112
Potassium: 4.8 (ref 3.4–5.3)
Sodium: 139 (ref 137–147)

## 2020-05-01 LAB — HEPATIC FUNCTION PANEL
ALT: 10 (ref 7–35)
AST: 15 (ref 13–35)
Alkaline Phosphatase: 90 (ref 25–125)
Bilirubin, Total: 0.6

## 2020-05-01 LAB — LIPID PANEL
Cholesterol: 264 — AB (ref 0–200)
HDL: 45 (ref 35–70)
LDL Cholesterol: 140
Triglycerides: 398 — AB (ref 40–160)

## 2020-05-01 LAB — COMPREHENSIVE METABOLIC PANEL: Calcium: 9.1 (ref 8.7–10.7)

## 2020-05-01 LAB — CBC AND DIFFERENTIAL
HCT: 40 (ref 36–46)
Hemoglobin: 13.3 (ref 12.0–16.0)
Platelets: 196 (ref 150–399)
WBC: 6.1

## 2020-05-01 LAB — CBC: RBC: 4.68 (ref 3.87–5.11)

## 2020-05-01 LAB — TSH: TSH: 3.55 (ref 0.41–5.90)

## 2020-05-03 ENCOUNTER — Telehealth: Payer: Self-pay

## 2020-05-03 NOTE — Telephone Encounter (Signed)
Per Dr.Reed:  1.) Electrolytes, liver, kidneys ok 2.) Blood counts ok 3.) Thyroid ok 4.) Bad cholesterol high, was 88, 10 months ago and now 140.   Left message on voicemail for patient to return call when available

## 2020-05-03 NOTE — Telephone Encounter (Signed)
Patient returned the call and results were given to her.

## 2020-05-08 NOTE — Telephone Encounter (Signed)
Urinalysis returned later on and did not show any blood or red blood cells in it.  We'd done this due to her distant history of hematuria.  Please notify her of this.

## 2020-05-08 NOTE — Telephone Encounter (Signed)
Patient was notified of results of urinalysis and was pleased with results. She has an appointment with Dr. Mariea Clonts at [facility] on 05/09/20.

## 2020-05-09 ENCOUNTER — Encounter: Payer: Self-pay | Admitting: Internal Medicine

## 2020-05-09 ENCOUNTER — Non-Acute Institutional Stay: Payer: Medicare Other | Admitting: Internal Medicine

## 2020-05-09 ENCOUNTER — Other Ambulatory Visit: Payer: Self-pay

## 2020-05-09 VITALS — BP 118/78 | HR 80 | Temp 97.9°F | Ht 61.0 in | Wt 141.8 lb

## 2020-05-09 DIAGNOSIS — M1812 Unilateral primary osteoarthritis of first carpometacarpal joint, left hand: Secondary | ICD-10-CM

## 2020-05-09 DIAGNOSIS — R29810 Facial weakness: Secondary | ICD-10-CM

## 2020-05-09 DIAGNOSIS — E7800 Pure hypercholesterolemia, unspecified: Secondary | ICD-10-CM | POA: Diagnosis not present

## 2020-05-09 DIAGNOSIS — Z87448 Personal history of other diseases of urinary system: Secondary | ICD-10-CM

## 2020-05-09 NOTE — Progress Notes (Signed)
Location:  Occupational psychologist of Service:  Clinic (12)  Provider: Tristen Luce L. Mariea Clonts, D.O., C.M.D.  Code Status: DNR Goals of Care:  Advanced Directives 05/09/2020  Does Patient Have a Medical Advance Directive? Yes  Type of Advance Directive -  Does patient want to make changes to medical advance directive? -  Copy of [place] in Chart? -  Pre-existing out of facility DNR order (yellow form or pink MOST form) -   Chief Complaint  Patient presents with  . Medical Management of Chronic Issues    follow up     HPI: Patient is a 86 y.o. female seen today for medical management of chronic diseases.    She tried to take her pravastatin every other day to see if her joints and bones felt better.  They did not and she thinks it turns out it's b/c she's 88.  Her cholesterol went way up on her labs.  Her worst problem today is pain at the base of her left thumb.  She went shopping for a bathing suit yesterday and she was carrying her heavy pocketbook and knitting last night.  Put ice on it and it's really sore today.  Her son in law recommended a brace for her thumb (he's a Psychiatric nurse for hands.  Has noted that her right lip droops and she says she drools a little.  It's embarrassing.  She is going to work on facial exercises for it.    She's had no problem with blood in her urine in several years. No hematuria this time on UA.  She says she has not gotten pneumonia vaccines b/c she wants to get pneumonia and die from it not get it, be hospitalized and never by the same.  She's still walking and playing golf.     Past Medical History:  Diagnosis Date  . Allergy   . Arthritis   . Back pain   . Chronic cystitis with hematuria   . Colon polyps   . Cystocele, unspecified (CODE)   . Diverticulitis   . Dyslipidemia   . Gallstones   . Gastric ulcer   . Gastritis   . GERD (gastroesophageal reflux disease)   . Hearing loss   . Hemorrhoids    . Hyperlipidemia   . Hypertension   . Impaired fasting glucose   . Macular degeneration   . Melanoma in situ of neck ([place])   . Renal insufficiency     Past Surgical History:  Procedure Laterality Date  . APPENDECTOMY    . CHOLECYSTECTOMY     with appendectomy  . THROAT SURGERY     calcified ligament  . TONSILLECTOMY      Allergies  Allergen Reactions  . Oysters [Shellfish Allergy] Swelling  . Sulfa Antibiotics     CANNOT REMEMBER REACTION    Outpatient Encounter Medications as of 05/09/2020  Medication Sig  . ciprofloxacin (CIPRO) 500 MG tablet Take 1 tablet (500 mg total) by mouth 2 (two) times daily.  . Multiple Vitamins-Minerals (ICAPS AREDS 2 PO) Take 1 tablet by mouth 2 (two) times daily.  . pravastatin (PRAVACHOL) 20 MG tablet Take 1 tablet (20 mg total) by mouth daily.   No facility-administered encounter medications on file as of 05/09/2020.    Review of Systems:  Review of Systems  Constitutional: Negative for chills, fever and malaise/fatigue.  HENT: Negative for congestion.   Eyes: Negative for blurred vision.  Respiratory: Negative for cough and shortness  of breath.   Cardiovascular: Negative for chest pain, palpitations and leg swelling.  Gastrointestinal: Negative for abdominal pain.  Genitourinary: Negative for dysuria and hematuria.  Musculoskeletal: Positive for joint pain. Negative for falls.  Skin: Negative for rash.  Neurological: Negative for dizziness and loss of consciousness.  Psychiatric/Behavioral: Negative for depression and memory loss. The patient is not nervous/anxious and does not have insomnia.     Health Maintenance  Topic Date Due  . COVID-19 Vaccine (1) Never done  . TETANUS/TDAP  Never done  . DEXA SCAN  Never done  . PNA vac Low Risk Adult (1 of 2 - PCV13) Never done  . INFLUENZA VACCINE  05/27/2020    Physical Exam: Vitals:   05/09/20 0835  BP: 118/78  Pulse: 80  Temp: 97.9 F (36.6 C)  SpO2: 98%  Weight: 141 lb  12.8 oz (64.3 kg)  Height: 5\' 1"  (1.549 m)   Body mass index is 26.79 kg/m. Physical Exam Vitals reviewed.  Constitutional:      General: She is not in acute distress.    Appearance: Normal appearance. She is not toxic-appearing.  HENT:     Head: Normocephalic and atraumatic.  Cardiovascular:     Rate and Rhythm: Normal rate and regular rhythm.     Pulses: Normal pulses.     Heart sounds: Normal heart sounds.  Pulmonary:     Effort: Pulmonary effort is normal.     Breath sounds: Normal breath sounds.  Abdominal:     General: Bowel sounds are normal.     Palpations: Abdomen is soft.  Musculoskeletal:        General: Normal range of motion.     Right lower leg: No edema.     Left lower leg: No edema.  Skin:    General: Skin is warm and dry.  Neurological:     General: No focal deficit present.     Mental Status: She is alert and oriented to person, place, and time.     Motor: No weakness.     Gait: Gait normal.     Comments: Right side of lips drooping but able to smile normally  Psychiatric:        Mood and Affect: Mood normal.        Behavior: Behavior normal.        Thought Content: Thought content normal.        Judgment: Judgment normal.     Labs reviewed: Basic Metabolic Panel: Recent Labs    06/09/19 0000 05/01/20 1033  NA 141 139  K 4.4 4.8  CL  --  106  CO2  --  23*  BUN 20 12  CREATININE 0.8 0.6  CALCIUM  --  9.1  TSH  --  3.55   Liver Function Tests: Recent Labs    06/09/19 0000 05/01/20 1033  AST 17 15  ALT 11 10  ALKPHOS 77 90   No results for input(s): LIPASE, AMYLASE in the last 8760 hours. No results for input(s): AMMONIA in the last 8760 hours. CBC: Recent Labs    06/09/19 0000 05/01/20 1033  WBC 5.5 6.1  HGB 13.3 13.3  HCT 39 40  PLT 204 196   Lipid Panel: Recent Labs    06/09/19 0000 05/01/20 1033  CHOL 171 264*  HDL 50 45  LDLCALC 88 140  TRIG 167* 398*   No results found for: HGBA1C  Procedures since last  visit: No results found.  Assessment/Plan 1. Arthritis of  carpometacarpal [facility]) joint of left thumb -may use topical voltaren, biofreeze, brace  2. H/O hematuria -UA just recently normal  3. Pure hypercholesterolemia -she already resumed daily statin therapy after she was called with lab results  4. Mouth droop due to facial weakness -she is going to do some facial strengthening exercises  Labs/tests ordered:   FLP before   Next appt:  08/21/2020  Elazar Argabright L. Lycia Sachdeva, D.O. [facility] [address], [place] [address] Cell Phone (Mon-Fri 8am-5pm):  [phone] On Call:  [phone] & follow prompts after 5pm & weekends Office Phone:  [phone] Office Fax:  [phone]

## 2020-05-10 DIAGNOSIS — R29810 Facial weakness: Secondary | ICD-10-CM | POA: Insufficient documentation

## 2020-07-23 ENCOUNTER — Other Ambulatory Visit: Payer: Self-pay | Admitting: Internal Medicine

## 2020-07-31 LAB — LIPID PANEL
Cholesterol: 191 (ref 0–200)
HDL: 47 (ref 35–70)
LDL Cholesterol: 99
Triglycerides: 226 — AB (ref 40–160)

## 2020-08-02 ENCOUNTER — Telehealth: Payer: Self-pay

## 2020-08-02 ENCOUNTER — Encounter: Payer: Self-pay | Admitting: Internal Medicine

## 2020-08-02 NOTE — Telephone Encounter (Signed)
I left a voicemail for Yolanda Burke to call us back at my extension. Per Dr. Lum Keas Cholesterol is ok. Triglycerides are elevated @226  and the goal is <150. Cut down on starchy fats in diet.

## 2020-08-21 ENCOUNTER — Encounter: Payer: Medicare Other | Admitting: Nurse Practitioner

## 2020-08-21 ENCOUNTER — Other Ambulatory Visit: Payer: Self-pay

## 2020-08-21 NOTE — Progress Notes (Signed)
.  err This encounter was created in error - please disregard. 

## 2020-08-23 ENCOUNTER — Encounter: Payer: Medicare Other | Admitting: Nurse Practitioner

## 2020-10-31 ENCOUNTER — Other Ambulatory Visit: Payer: Self-pay

## 2020-10-31 ENCOUNTER — Non-Acute Institutional Stay: Payer: Medicare Other | Admitting: Internal Medicine

## 2020-10-31 ENCOUNTER — Other Ambulatory Visit: Payer: Self-pay | Admitting: Internal Medicine

## 2020-10-31 ENCOUNTER — Encounter: Payer: Self-pay | Admitting: Internal Medicine

## 2020-10-31 VITALS — BP 122/84 | HR 68 | Temp 96.8°F | Ht 61.0 in | Wt 139.4 lb

## 2020-10-31 DIAGNOSIS — Z85820 Personal history of malignant melanoma of skin: Secondary | ICD-10-CM

## 2020-10-31 DIAGNOSIS — E7800 Pure hypercholesterolemia, unspecified: Secondary | ICD-10-CM | POA: Diagnosis not present

## 2020-10-31 DIAGNOSIS — H9113 Presbycusis, bilateral: Secondary | ICD-10-CM

## 2020-10-31 DIAGNOSIS — H3530 Unspecified macular degeneration: Secondary | ICD-10-CM

## 2020-10-31 DIAGNOSIS — M79604 Pain in right leg: Secondary | ICD-10-CM | POA: Diagnosis not present

## 2020-10-31 DIAGNOSIS — R498 Other voice and resonance disorders: Secondary | ICD-10-CM

## 2020-10-31 MED ORDER — PRAVASTATIN SODIUM 20 MG PO TABS: 20.0000 mg | ORAL_TABLET | Freq: Every day | ORAL | 3 refills | Status: DC

## 2020-10-31 NOTE — Addendum Note (Signed)
Addended by: Karlene Lineman on: 10/31/2020 04:34 PM   Modules accepted: Orders

## 2020-10-31 NOTE — Progress Notes (Signed)
Location:    [facility]   Place of Service:   clinic  Provider: Jevante Hollibaugh L. Mariea Clonts, D.O., C.M.D.  Code Status: DNR Goals of Care:  Advanced Directives 10/31/2020  Does Patient Have a Medical Advance Directive? Yes  Type of Advance Directive Out of facility DNR (pink MOST or yellow form);Living will  Does patient want to make changes to medical advance directive? No - Patient declined  Copy of [place] in Chart? -  Pre-existing out of facility DNR order (yellow form or pink MOST form) -     Chief Complaint  Patient presents with  . Medical Management of Chronic Issues    6 month follow up. Questions about getting a shingles vaccination. Possible referral to audiology  and rt leg pain.     HPI: Patient is a 87 y.o. female seen today for medical management of chronic diseases.    Right leg hurts at times.  Will hurt sometimes when walking.  It was bothering her when she came in her but at this moment she can't feel it.  Right is slightly swollen and always is the one the swells more.  There's a bump back in the muscle area that's new and that's where she has tenderness.  Tries to avoid socks with elastic at the top.  Has come on probably over weeks.  She's aware of it.    She wants a copy of her labs--she'd been taking her statin every other day and bad cholesterol had been high so now on daily statin.  Fingers hips and knees ache more than in the past.  She's about to have her 89th bday.  She's not had shingrix.  She will go get at the [facility].    She's asking for a referral to a hearing aide specialist.  She's having trouble hearing people speak in a group.  Discussed various types.  She's thinking of going to [employer] for them.    Dermatology:  She had melanoma 20 yrs ago so went to derm twice a year until she moved here.  She has a derm appt in March with her son's dermatologist.    Has been going to retina specialist, Dr. Iona Hansen.  Has macular  degeneration.    Uses metamucil for her bowels.  Doing in am and then reading the paper and eating breakfast after that.  Bowels never normal but same abnormal for years.  No diverticulitis attacks.    Past Medical History:  Diagnosis Date  . Allergy   . Arthritis   . Back pain   . Chronic cystitis with hematuria   . Colon polyps   . Cystocele, unspecified (CODE)   . Diverticulitis   . Dyslipidemia   . Gallstones   . Gastric ulcer   . Gastritis   . GERD (gastroesophageal reflux disease)   . Hearing loss   . Hemorrhoids   . Hyperlipidemia   . Hypertension   . Impaired fasting glucose   . Macular degeneration   . Melanoma in situ of neck ([place])   . Renal insufficiency     Past Surgical History:  Procedure Laterality Date  . APPENDECTOMY    . CHOLECYSTECTOMY     with appendectomy  . THROAT SURGERY     calcified ligament  . TONSILLECTOMY      Allergies  Allergen Reactions  . Oysters [Shellfish Allergy] Swelling  . Sulfa Antibiotics     CANNOT REMEMBER REACTION    Outpatient Encounter Medications as of  10/31/2020  Medication Sig  . ciprofloxacin (CIPRO) 500 MG tablet Take 1 tablet (500 mg total) by mouth 2 (two) times daily.  . Multiple Vitamins-Minerals (ICAPS AREDS 2 PO) Take 1 tablet by mouth 2 (two) times daily.  . pravastatin (PRAVACHOL) 20 MG tablet Take 1 tablet by mouth once daily   No facility-administered encounter medications on file as of 10/31/2020.    Review of Systems:  Review of Systems  Constitutional: Negative for chills and fever.  HENT: Positive for hearing loss. Negative for congestion and sore throat.   Eyes: Positive for blurred vision.       "getting worse", macular degeneration  Respiratory: Negative for cough and shortness of breath.   Cardiovascular: Negative for chest pain and palpitations.       Very minimal RLE swelling, none LLE  Gastrointestinal: Positive for constipation. Negative for abdominal pain, blood in stool, diarrhea and  melena.  Genitourinary: Negative for dysuria.  Musculoskeletal: Positive for joint pain and myalgias. Negative for falls.       RLE calf/lateral shin pain--myalgia  Skin: Negative for itching and rash.  Neurological: Negative for dizziness and loss of consciousness.  Endo/Heme/Allergies: Does not bruise/bleed easily.  Psychiatric/Behavioral: Negative for depression and memory loss. The patient is not nervous/anxious and does not have insomnia.     Health Maintenance  Topic Date Due  . DEXA SCAN  05/09/2021 (Originally [birth year]/04/1998)  . TETANUS/TDAP  05/09/2021 (Originally 11/04/1950)  . PNA vac Low Risk Adult (1 of 2 - PCV13) 05/09/2021 (Originally [birth year]/04/1998)  . INFLUENZA VACCINE  Completed  . COVID-19 Vaccine  Completed    Physical Exam: Vitals:   10/31/20 0815  BP: 122/84  Pulse: 68  Temp: (!) 96.8 F (36 C)  TempSrc: Temporal  SpO2: 98%  Weight: 139 lb 6.4 oz (63.2 kg)  Height: 5\' 1"  (1.549 m)   Body mass index is 26.34 kg/m. Physical Exam Vitals reviewed.  Constitutional:      General: She is not in acute distress.    Appearance: Normal appearance. She is not toxic-appearing.  HENT:     Head: Normocephalic and atraumatic.  Eyes:     Extraocular Movements: Extraocular movements intact.     Pupils: Pupils are equal, round, and reactive to light.  Cardiovascular:     Rate and Rhythm: Normal rate and regular rhythm.     Pulses: Normal pulses.     Heart sounds: Normal heart sounds.  Pulmonary:     Effort: Pulmonary effort is normal.     Breath sounds: Normal breath sounds. No wheezing, rhonchi or rales.  Abdominal:     General: Bowel sounds are normal.  Musculoskeletal:        General: Normal range of motion.  Skin:    General: Skin is warm and dry.     Comments: divet in right posterolateral calf which is the area of tenderness  Neurological:     General: No focal deficit present.     Mental Status: She is alert and oriented to person, place, and time.   Psychiatric:        Mood and Affect: Mood normal.        Behavior: Behavior normal.     Labs reviewed: Basic Metabolic Panel: Recent Labs    05/01/20 1033  NA 139  K 4.8  CL 106  CO2 23*  BUN 12  CREATININE 0.6  CALCIUM 9.1  TSH 3.55   Liver Function Tests: Recent Labs    05/01/20 1033  AST 15  ALT 10  ALKPHOS 90   No results for input(s): LIPASE, AMYLASE in the last 8760 hours. No results for input(s): AMMONIA in the last 8760 hours. CBC: Recent Labs    05/01/20 1033  WBC 6.1  HGB 13.3  HCT 40  PLT 196   Lipid Panel: Recent Labs    05/01/20 0000 07/31/20 0000  CHOL 264* 191  HDL 45 47  LDLCALC 140 99  TRIG 398* 226*   No results found for: HGBA1C  Procedures since last visit: No results found.  Assessment/Plan 1. Right leg pain - tenderness in calf, ?dermatologic or muscular  - US SOFT TISSUE LOWER EXTREMITY LIMITED RIGHT (NON-VASCULAR); Future -has had melanoma in the past, also and will see derm in march, but would recommend moving up if we don't find a cause of the discomfort on the [place]  2. Presbycusis of both ears -she will got to [employer] for hearing aids  3. Pure hypercholesterolemia -control improved with daily statin, cont same -I don't think her localized pain has anything to do with this  4. Vocal tremor -chronic, stable  5. Personal history of malignant melanoma of skin -derm appt has been scheduled   6. Macular degeneration of both eyes, unspecified type - wants to switch retina specialists  - Ambulatory referral to Ophthalmology  Labs/tests ordered:  Cbc, cmp, flp  Next appt:  6 mos med mgt  Tynell Winchell L. Laurel Harnden, D.O. [facility] [address], [place] [address] Cell Phone (Mon-Fri 8am-5pm):  [phone] On Call:  [phone] & follow prompts after 5pm & weekends Office Phone:  [phone] Office Fax:  [phone]

## 2020-11-01 ENCOUNTER — Other Ambulatory Visit: Payer: Self-pay

## 2020-11-01 ENCOUNTER — Ambulatory Visit
Admission: RE | Admit: 2020-11-01 | Discharge: 2020-11-01 | Disposition: A | Discharge status: Home / Self Care | Payer: Medicare Other | Source: Ambulatory Visit | Attending: Internal Medicine | Admitting: Internal Medicine

## 2020-11-01 DIAGNOSIS — M79604 Pain in right leg: Secondary | ICD-10-CM

## 2020-11-01 NOTE — Progress Notes (Signed)
Nothing was seen in the area of concern on her ultrasound.  I recommend she seek a dermatology opinion about the area.

## 2020-11-08 ENCOUNTER — Encounter (INDEPENDENT_AMBULATORY_CARE_PROVIDER_SITE_OTHER): Payer: Self-pay | Admitting: Ophthalmology

## 2020-11-08 ENCOUNTER — Other Ambulatory Visit: Payer: Self-pay

## 2020-11-08 ENCOUNTER — Ambulatory Visit (INDEPENDENT_AMBULATORY_CARE_PROVIDER_SITE_OTHER): Payer: Medicare Other | Admitting: Ophthalmology

## 2020-11-08 DIAGNOSIS — I10 Essential (primary) hypertension: Secondary | ICD-10-CM | POA: Diagnosis not present

## 2020-11-08 DIAGNOSIS — Z961 Presence of intraocular lens: Secondary | ICD-10-CM

## 2020-11-08 DIAGNOSIS — H3581 Retinal edema: Secondary | ICD-10-CM

## 2020-11-08 DIAGNOSIS — H353134 Nonexudative age-related macular degeneration, bilateral, advanced atrophic with subfoveal involvement: Secondary | ICD-10-CM

## 2020-11-08 DIAGNOSIS — H35033 Hypertensive retinopathy, bilateral: Secondary | ICD-10-CM | POA: Diagnosis not present

## 2020-11-08 NOTE — Progress Notes (Signed)
[facility] Note  11/08/2020     CHIEF COMPLAINT Patient presents for Retina Evaluation   HISTORY OF PRESENT ILLNESS: Yolanda Burke is a 87 y.o. female who presents to the clinic today for:   HPI    Retina Evaluation    In both eyes.  Duration of 9 weeks.  I, the attending physician,  performed the HPI with the patient and updated documentation appropriately.          Comments    Patients last visit with Dr. Iona Hansen 09/06/2020.  She started with dry ARMD about 12 years ago that turned into wet.  She had injections in the past.  Her last injection was about 3 years.  She is unsure if vision is worse.  She states she always thinks it is worse every time she sees the doctor even if it isn't.  She uses Refresh prn OU.         Last edited by Bernarda Caffey, MD on 11/08/2020 12:04 PM. (History)    pt states about 12 years ago she was dx with non-exu ARMD, she states it turned wet and she started receiving injections, she states she has not had any injections for about 5 years, pt is a former pt of Dr. Iona Hansen, but did not receive any injections from him, pt last saw Dr. Iona Hansen in November 2021, pt saw Dr. Lowell Guitar in [place], [place], but moved here in February 2020  Referring physician: Gayland Curry, DO [place],  [place] [address]  HISTORICAL INFORMATION:   Selected notes from the [place] Former pt at [facility], pt wants to switch retina specialists LEE:  Ocular Hx- PMH-    CURRENT MEDICATIONS: No current outpatient medications on file. (Ophthalmic Drugs)   No current facility-administered medications for this visit. (Ophthalmic Drugs)   Current Outpatient Medications (Other)  Medication Sig  . Multiple Vitamins-Minerals (ICAPS AREDS 2 PO) Take 1 tablet by mouth 2 (two) times daily.  . pravastatin (PRAVACHOL) 20 MG tablet Take 1 tablet (20 mg total) by mouth daily.  . ciprofloxacin (CIPRO) 500 MG tablet Take 1 tablet (500  mg total) by mouth 2 (two) times daily. (Patient not taking: Reported on 11/08/2020)   No current facility-administered medications for this visit. (Other)      REVIEW OF SYSTEMS: ROS    Positive for: Gastrointestinal, Musculoskeletal, Eyes   Negative for: Constitutional, Neurological, Skin, Genitourinary, HENT, Endocrine, Cardiovascular, Respiratory, Psychiatric, Allergic/Imm, Heme/Lymph   Last edited by Leonie Douglas, COA on 11/08/2020  9:36 AM. (History)       ALLERGIES Allergies  Allergen Reactions  . Oysters [Shellfish Allergy] Swelling  . Sulfa Antibiotics     CANNOT REMEMBER REACTION    PAST MEDICAL HISTORY Past Medical History:  Diagnosis Date  . Allergy   . Arthritis   . Back pain   . Chronic cystitis with hematuria   . Colon polyps   . Cystocele, unspecified (CODE)   . Diverticulitis   . Dyslipidemia   . Gallstones   . Gastric ulcer   . Gastritis   . GERD (gastroesophageal reflux disease)   . Hearing loss   . Hemorrhoids   . Hyperlipidemia   . Hypertension   . Impaired fasting glucose   . Macular degeneration   . Melanoma in situ of neck ([place])   . Renal insufficiency    Past Surgical History:  Procedure Laterality Date  . APPENDECTOMY    .  CHOLECYSTECTOMY     with appendectomy  . THROAT SURGERY     calcified ligament  . TONSILLECTOMY      FAMILY HISTORY Family History  Problem Relation Age of Onset  . Heart failure Mother   . Dementia Mother   . Pneumonia Father   . Kidney failure Father   . Heart disease Brother   . Bladder Cancer Brother     SOCIAL HISTORY Social History   Tobacco Use  . Smoking status: Former Smoker    Types: Cigarettes    Quit date: 03/14/1985    Years since quitting: 35.6  . Smokeless tobacco: Never Used  . Tobacco comment: Smoked until age 38  Vaping Use  . Vaping Use: Never used  Substance Use Topics  . Alcohol use: Yes    Comment: social-occasional  . Drug use: Never         OPHTHALMIC  EXAM:  Base Eye Exam    Visual Acuity (Snellen - Linear)      Right Left   Dist cc 20/60 +2 20/70 +2   Dist ph cc NI NI       Tonometry (Tonopen, 9:52 AM)      Right Left   Pressure 16 15       Pupils      Dark Light Shape React APD   Right 3 2 Round Brisk None   Left 3 2 Round Brisk None       Visual Fields (Counting fingers)      Left Right    Full Full       Extraocular Movement      Right Left    Full Full       Neuro/Psych    Oriented x3: Yes   Mood/Affect: Normal       Dilation    Both eyes: 1.0% Mydriacyl, 2.5% Phenylephrine @ 9:52 AM        Slit Lamp and Fundus Exam    Slit Lamp Exam      Right Left   Lids/Lashes Dermatochalasis - upper lid Dermatochalasis - upper lid   Conjunctiva/Sclera White and quiet White and quiet   Cornea arcus, trace Punctate epithelial erosions arcus, 2+fine, inferior Punctate epithelial erosions   Anterior Chamber Deep and quiet Deep and quiet   Iris Round and dilated Round and dilated   Lens Posterior chamber intraocular lens, 1+ non-central Posterior capsular opacification Posterior chamber intraocular lens, 1+non-central Posterior capsular opacification   Vitreous Vitreous syneresis Vitreous syneresis       Fundus Exam      Right Left   Disc Pink and Sharp, Compact Pink and Sharp, Compact   C/D Ratio 0.1 0.1   Macula Blunted foveal reflex, central GA with refractile subretinal deposits within, scattered drusen, no heme Blunted foveal reflex, central GA with pigment clumping, scattered drusen, no heme or edema   Vessels attenuated, mild tortuousity attenuated, mild tortuousity   Periphery Attached, no heme    Attached, no heme          Refraction    Wearing Rx      Sphere Cylinder Axis Add   Right -2.25 +2.00 015 +3.00   Left -2.25 +2.75 155 +3.00       Manifest Refraction      Sphere Cylinder Axis Dist VA   Right -2.50 +1.75 180 NI   Left -2.25 +2.50 155 NI          IMAGING AND PROCEDURES  Imaging and  Procedures  for 11/08/2020  OCT, Retina - OU - Both Eyes       Right Eye Quality was good. Central Foveal Thickness: 252. Progression has no prior data. Findings include abnormal foveal contour, no IRF, no SRF, subretinal hyper-reflective material, intraretinal hyper-reflective material, retinal drusen , pigment epithelial detachment (Severe central thinning with SRHM / scarring).   Left Eye Quality was good. Central Foveal Thickness: 100. Progression has no prior data. Findings include abnormal foveal contour, no IRF, no SRF, retinal drusen , pigment epithelial detachment, outer retinal atrophy, inner retinal atrophy (Severe central thinning / atrophy).   Notes *Images captured and stored on drive  Diagnosis / Impression:  non-exu ARMD OU OD: Severe central thinning with SRHM / scarring OS: Severe central thinning / atrophy  Clinical management:  See below  Abbreviations: NFP - Normal foveal profile. CME - cystoid macular edema. PED - pigment epithelial detachment. IRF - intraretinal fluid. SRF - subretinal fluid. EZ - ellipsoid zone. ERM - epiretinal membrane. ORA - outer retinal atrophy. ORT - outer retinal tubulation. SRHM - subretinal hyper-reflective material. IRHM - intraretinal hyper-reflective material                 ASSESSMENT/PLAN:    ICD-10-CM   1. Advanced atrophic nonexudative age-related macular degeneration of both eyes with subfoveal involvement  H35.3134   2. Retinal edema  H35.81 OCT, Retina - OU - Both Eyes  3. Essential hypertension  I10   4. Hypertensive retinopathy of both eyes  H35.033   5. Pseudophakia of both eyes  Z96.1     1,2. Age related macular degeneration, non-exudative, both eyes  - former pt at [facility]) and in [place]; moved from [place] in 2020)  - history of intravitreal injections with Lowell Guitar, last being ~2016  - exam shows central GA OU, OD with central refractile drusen and scarring  - The incidence,  anatomy, and pathology of dry AMD, risk of progression, and the AREDS and AREDS 2 study including smoking risks discussed with patient.  - Recommend amsler grid monitoring  - f/u 3-4 months, DFE, OCT  3,4. Hypertensive retinopathy OU - discussed importance of tight BP control - monitor  5. Pseudophakia OU  - s/p CE/IOL OU (Dr. Loni Dolly, [place], [place])  - IOL in good position, doing well  - monitor  Ophthalmic Meds Ordered this visit:  No orders of the defined types were placed in this encounter.      Return for f/u 3-4 months, non-exu ARMD OU, DFE, OCT.  There are no Patient Instructions on file for this visit.   Explained the diagnoses, plan, and follow up with the patient and they expressed understanding.  Patient expressed understanding of the importance of proper follow up care.   This document serves as a record of services personally performed by Gardiner Sleeper, MD, PhD. It was created on their behalf by San Jetty. Owens Shark, OA an ophthalmic technician. The creation of this record is the provider's dictation and/or activities during the visit.    Electronically signed by: San Jetty. Owens Shark, [place] 01.13.2022 12:11 PM  Gardiner Sleeper, M.D., Ph.D. Diseases & Surgery of the Retina and Vitreous [facility]  I have reviewed the above documentation for accuracy and completeness, and I agree with the above. Gardiner Sleeper, M.D., Ph.D. 11/08/20 12:11 PM   Abbreviations: M myopia (nearsighted); A astigmatism; H hyperopia (farsighted); P presbyopia; Mrx spectacle prescription;  CTL contact lenses; OD right eye; OS left  eye; OU both eyes  XT exotropia; ET esotropia; PEK punctate epithelial keratitis; PEE punctate epithelial erosions; DES dry eye syndrome; MGD meibomian gland dysfunction; ATs artificial tears; PFAT's preservative free artificial tears; [place] nuclear sclerotic cataract; PSC posterior subcapsular cataract; ERM epi-retinal membrane; PVD posterior vitreous  detachment; RD retinal detachment; DM diabetes mellitus; DR diabetic retinopathy; NPDR non-proliferative diabetic retinopathy; PDR proliferative diabetic retinopathy; CSME clinically significant macular edema; DME diabetic macular edema; dbh dot blot hemorrhages; CWS cotton wool spot; POAG primary open angle glaucoma; C/D cup-to-disc ratio; HVF humphrey visual field; GVF goldmann visual field; OCT optical coherence tomography; IOP intraocular pressure; BRVO Branch retinal vein occlusion; CRVO central retinal vein occlusion; CRAO central retinal artery occlusion; BRAO branch retinal artery occlusion; RT retinal tear; SB scleral buckle; PPV pars plana vitrectomy; VH Vitreous hemorrhage; PRP panretinal laser photocoagulation; IVK intravitreal kenalog; VMT vitreomacular traction; MH Macular hole;  NVD neovascularization of the disc; NVE neovascularization elsewhere; AREDS age related eye disease study; ARMD age related macular degeneration; POAG primary open angle glaucoma; EBMD epithelial/anterior basement membrane dystrophy; ACIOL anterior chamber intraocular lens; IOL intraocular lens; PCIOL posterior chamber intraocular lens; Phaco/IOL phacoemulsification with intraocular lens placement; [place] photorefractive keratectomy; LASIK laser assisted in situ keratomileusis; HTN hypertension; DM diabetes mellitus; COPD chronic obstructive pulmonary disease

## 2020-12-17 ENCOUNTER — Encounter: Payer: Self-pay | Admitting: Internal Medicine

## 2021-02-07 NOTE — Progress Notes (Signed)
[facility] Note  02/13/2021     CHIEF COMPLAINT Patient presents for Retina Follow Up   HISTORY OF PRESENT ILLNESS: Yolanda Burke is a 87 y.o. female who presents to the clinic today for:   HPI    Retina Follow Up    Patient presents with  Dry AMD.  In both eyes.  This started months ago.  Severity is moderate.  Duration of 3 months.  Since onset it is gradually worsening.  I, the attending physician,  performed the HPI with the patient and updated documentation appropriately.          Comments    87 y/o female pt here for 3 mo f/u for non-exu ARMD OU.  Feels VA OU may be gradually getting worse, but its hard to say.  Denies pain, FOL, floaters.  AT prn OU.       Last edited by Bernarda Caffey, MD on 02/15/2021  1:00 AM. (History)    pt states she "always feels like her vision is worse", but can't really tell  Referring physician: Gayland Curry, DO [place],  [place] [address]  HISTORICAL INFORMATION:   Selected notes from the [place] Former pt at [facility], pt wants to switch retina specialists LEE:  Ocular Hx- PMH-    CURRENT MEDICATIONS: No current outpatient medications on file. (Ophthalmic Drugs)   No current facility-administered medications for this visit. (Ophthalmic Drugs)   Current Outpatient Medications (Other)  Medication Sig  . betamethasone dipropionate 0.05 % lotion Apply topically.  . ciprofloxacin (CIPRO) 500 MG tablet Take 1 tablet (500 mg total) by mouth 2 (two) times daily.  . Multiple Vitamins-Minerals (ICAPS AREDS 2 PO) Take 1 tablet by mouth 2 (two) times daily.  . pravastatin (PRAVACHOL) 20 MG tablet Take 1 tablet (20 mg total) by mouth daily.   No current facility-administered medications for this visit. (Other)      REVIEW OF SYSTEMS: ROS    Positive for: Gastrointestinal, Skin, Genitourinary, Musculoskeletal, HENT, Eyes   Negative for: Constitutional, Neurological,  Endocrine, Cardiovascular, Respiratory, Psychiatric, Allergic/Imm, Heme/Lymph   Last edited by Matthew Folks, COA on 02/13/2021  9:00 AM. (History)       ALLERGIES Allergies  Allergen Reactions  . Oysters [Shellfish Allergy] Swelling  . Sulfa Antibiotics     CANNOT REMEMBER REACTION    PAST MEDICAL HISTORY Past Medical History:  Diagnosis Date  . Allergy   . Arthritis   . Back pain   . Chronic cystitis with hematuria   . Colon polyps   . Cystocele, unspecified (CODE)   . Diverticulitis   . Dyslipidemia   . Gallstones   . Gastric ulcer   . Gastritis   . GERD (gastroesophageal reflux disease)   . Hearing loss   . Hemorrhoids   . Hyperlipidemia   . Hypertension   . Hypertensive retinopathy    OU  . Impaired fasting glucose   . Macular degeneration    Dry OU  . Melanoma in situ of neck ([place])   . Renal insufficiency    Past Surgical History:  Procedure Laterality Date  . APPENDECTOMY    . CATARACT EXTRACTION Bilateral    Dr. Loni Dolly - [place], [place]     with appendectomy  . EYE SURGERY Bilateral    Cat Sx - Dr. Loni Dolly - [place], [place]  . THROAT SURGERY     calcified ligament  . TONSILLECTOMY  FAMILY HISTORY Family History  Problem Relation Age of Onset  . Heart failure Mother   . Dementia Mother   . Pneumonia Father   . Kidney failure Father   . Heart disease Brother   . Bladder Cancer Brother     SOCIAL HISTORY Social History   Tobacco Use  . Smoking status: Former Smoker    Types: Cigarettes    Quit date: 03/14/1985    Years since quitting: 35.9  . Smokeless tobacco: Never Used  . Tobacco comment: Smoked until age 48  Vaping Use  . Vaping Use: Never used  Substance Use Topics  . Alcohol use: Yes    Comment: social-occasional  . Drug use: Never         OPHTHALMIC EXAM:  Base Eye Exam    Visual Acuity (Snellen - Linear)      Right Left   Dist cc 20/60 20/70   Dist ph cc NI NI   Correction: Glasses        Tonometry (Tonopen, 9:08 AM)      Right Left   Pressure 15 15       Pupils      Dark Light Shape React APD   Right 3 2 Round Brisk None   Left 3 2 Round Brisk None       Visual Fields (Counting fingers)      Left Right    Full Full       Extraocular Movement      Right Left    Full, Ortho Full, Ortho       Neuro/Psych    Oriented x3: Yes   Mood/Affect: Normal       Dilation    Both eyes: 1.0% Mydriacyl, 2.5% Phenylephrine @ 9:07 AM        Slit Lamp and Fundus Exam    Slit Lamp Exam      Right Left   Lids/Lashes Dermatochalasis - upper lid Dermatochalasis - upper lid   Conjunctiva/Sclera White and quiet White and quiet   Cornea arcus, trace Punctate epithelial erosions arcus, 2+fine, inferior Punctate epithelial erosions   Anterior Chamber Deep and quiet Deep and quiet   Iris Round and dilated Round and dilated   Lens Posterior chamber intraocular lens, 1+ non-central Posterior capsular opacification Posterior chamber intraocular lens, 1+non-central Posterior capsular opacification   Vitreous Vitreous syneresis Vitreous syneresis       Fundus Exam      Right Left   Disc Pink and Sharp, Compact Pink and Sharp, Compact   C/D Ratio 0.1 0.1   Macula Blunted foveal reflex, central GA with refractile subretinal deposits within, scattered drusen, no heme Blunted foveal reflex, central GA with pigment clumping, scattered drusen, no heme or edema   Vessels attenuated, mild tortuousity attenuated, mild tortuousity   Periphery Attached, no heme    Attached, no heme            IMAGING AND PROCEDURES  Imaging and Procedures for 02/13/2021  OCT, Retina - OU - Both Eyes       Right Eye Quality was good. Central Foveal Thickness: 239. Progression has been stable. Findings include abnormal foveal contour, no IRF, no SRF, subretinal hyper-reflective material, intraretinal hyper-reflective material, retinal drusen , pigment epithelial detachment (Severe central thinning with  SRHM / scarring - stable from prior).   Left Eye Quality was good. Central Foveal Thickness: 96. Progression has been stable. Findings include abnormal foveal contour, no IRF, no SRF, retinal drusen , pigment  epithelial detachment, outer retinal atrophy, inner retinal atrophy (Severe central thinning / atrophy).   Notes *Images captured and stored on drive  Diagnosis / Impression:  non-exu ARMD OU OD: Severe central thinning with SRHM / scarring OS: Severe central thinning / atrophy  Clinical management:  See below  Abbreviations: NFP - Normal foveal profile. CME - cystoid macular edema. PED - pigment epithelial detachment. IRF - intraretinal fluid. SRF - subretinal fluid. EZ - ellipsoid zone. ERM - epiretinal membrane. ORA - outer retinal atrophy. ORT - outer retinal tubulation. SRHM - subretinal hyper-reflective material. IRHM - intraretinal hyper-reflective material                 ASSESSMENT/PLAN:    ICD-10-CM   1. Advanced atrophic nonexudative age-related macular degeneration of both eyes with subfoveal involvement  H35.3134   2. Retinal edema  H35.81 OCT, Retina - OU - Both Eyes  3. Essential hypertension  I10   4. Hypertensive retinopathy of both eyes  H35.033   5. Pseudophakia of both eyes  Z96.1     1,2. Age related macular degeneration, non-exudative, both eyes  - former pt at [facility]) and in [place]; moved from [place] in 2020)  - history of intravitreal injections with Lowell Guitar, last being ~2016  - exam shows central GA OU, OD with central refractile drusen and scarring  - Recommend continued amsler grid monitoring  - f/u 4-6 months, DFE, OCT  3,4. Hypertensive retinopathy OU - discussed importance of tight BP control - monitor  5. Pseudophakia OU  - s/p CE/IOL OU (Dr. Loni Dolly, [place], [place])  - IOL in good position, doing well  - monitor  Ophthalmic Meds Ordered this visit:  No orders of the defined types were placed in this  encounter.      Return for f/u 4-6 months, non-exu ARMD OU, DFE, OCT.  There are no Patient Instructions on file for this visit.   Explained the diagnoses, plan, and follow up with the patient and they expressed understanding.  Patient expressed understanding of the importance of proper follow up care.   This document serves as a record of services personally performed by Gardiner Sleeper, MD, PhD. It was created on their behalf by San Jetty. Owens Shark, OA an ophthalmic technician. The creation of this record is the provider's dictation and/or activities during the visit.    Electronically signed by: San Jetty. [place], [place] 04.20.2022 1:04 AM  Gardiner Sleeper, M.D., Ph.D. Diseases & Surgery of the Retina and Vitreous [facility]  I have reviewed the above documentation for accuracy and completeness, and I agree with the above. Gardiner Sleeper, M.D., Ph.D. 02/15/21 1:04 AM   Abbreviations: M myopia (nearsighted); A astigmatism; H hyperopia (farsighted); P presbyopia; Mrx spectacle prescription;  CTL contact lenses; OD right eye; OS left eye; OU both eyes  XT exotropia; ET esotropia; PEK punctate epithelial keratitis; PEE punctate epithelial erosions; DES dry eye syndrome; MGD meibomian gland dysfunction; ATs artificial tears; PFAT's preservative free artificial tears; [place] nuclear sclerotic cataract; PSC posterior subcapsular cataract; ERM epi-retinal membrane; PVD posterior vitreous detachment; RD retinal detachment; DM diabetes mellitus; DR diabetic retinopathy; NPDR non-proliferative diabetic retinopathy; PDR proliferative diabetic retinopathy; CSME clinically significant macular edema; DME diabetic macular edema; dbh dot blot hemorrhages; CWS cotton wool spot; POAG primary open angle glaucoma; C/D cup-to-disc ratio; HVF humphrey visual field; GVF goldmann visual field; OCT optical coherence tomography; IOP intraocular pressure; BRVO Branch retinal vein occlusion; CRVO central  retinal vein occlusion; CRAO central retinal artery occlusion; BRAO branch retinal artery occlusion; RT retinal tear; SB scleral buckle; PPV pars plana vitrectomy; VH Vitreous hemorrhage; PRP panretinal laser photocoagulation; IVK intravitreal kenalog; VMT vitreomacular traction; MH Macular hole;  NVD neovascularization of the disc; NVE neovascularization elsewhere; AREDS age related eye disease study; ARMD age related macular degeneration; POAG primary open angle glaucoma; EBMD epithelial/anterior basement membrane dystrophy; ACIOL anterior chamber intraocular lens; IOL intraocular lens; PCIOL posterior chamber intraocular lens; Phaco/IOL phacoemulsification with intraocular lens placement; [place] photorefractive keratectomy; LASIK laser assisted in situ keratomileusis; HTN hypertension; DM diabetes mellitus; COPD chronic obstructive pulmonary disease

## 2021-02-13 ENCOUNTER — Encounter (INDEPENDENT_AMBULATORY_CARE_PROVIDER_SITE_OTHER): Payer: Self-pay | Admitting: Ophthalmology

## 2021-02-13 ENCOUNTER — Ambulatory Visit (INDEPENDENT_AMBULATORY_CARE_PROVIDER_SITE_OTHER): Payer: Medicare Other | Admitting: Ophthalmology

## 2021-02-13 ENCOUNTER — Other Ambulatory Visit: Payer: Self-pay

## 2021-02-13 DIAGNOSIS — H353134 Nonexudative age-related macular degeneration, bilateral, advanced atrophic with subfoveal involvement: Secondary | ICD-10-CM

## 2021-02-13 DIAGNOSIS — H3581 Retinal edema: Secondary | ICD-10-CM | POA: Diagnosis not present

## 2021-02-13 DIAGNOSIS — H35033 Hypertensive retinopathy, bilateral: Secondary | ICD-10-CM

## 2021-02-13 DIAGNOSIS — I10 Essential (primary) hypertension: Secondary | ICD-10-CM

## 2021-02-13 DIAGNOSIS — Z961 Presence of intraocular lens: Secondary | ICD-10-CM

## 2021-02-15 ENCOUNTER — Encounter (INDEPENDENT_AMBULATORY_CARE_PROVIDER_SITE_OTHER): Payer: Self-pay | Admitting: Ophthalmology

## 2021-03-20 ENCOUNTER — Other Ambulatory Visit: Payer: Self-pay

## 2021-03-20 ENCOUNTER — Encounter: Payer: Self-pay | Admitting: Internal Medicine

## 2021-03-20 ENCOUNTER — Non-Acute Institutional Stay: Payer: Medicare Other | Admitting: Internal Medicine

## 2021-03-20 VITALS — BP 146/68 | HR 58 | Temp 96.0°F | Ht 61.0 in | Wt 138.6 lb

## 2021-03-20 DIAGNOSIS — M2041 Other hammer toe(s) (acquired), right foot: Secondary | ICD-10-CM

## 2021-03-20 DIAGNOSIS — M79604 Pain in right leg: Secondary | ICD-10-CM

## 2021-03-20 DIAGNOSIS — Z87448 Personal history of other diseases of urinary system: Secondary | ICD-10-CM | POA: Diagnosis not present

## 2021-03-20 DIAGNOSIS — E7800 Pure hypercholesterolemia, unspecified: Secondary | ICD-10-CM | POA: Diagnosis not present

## 2021-03-20 DIAGNOSIS — K5790 Diverticulosis of intestine, part unspecified, without perforation or abscess without bleeding: Secondary | ICD-10-CM | POA: Diagnosis not present

## 2021-03-20 DIAGNOSIS — H3530 Unspecified macular degeneration: Secondary | ICD-10-CM

## 2021-03-20 NOTE — Progress Notes (Signed)
Location:  [facility] of Service:  Clinic (12)  Provider:   Code Status:  Goals of Care:  Advanced Directives 03/20/2021  Does Patient Have a Medical Advance Directive? Yes  Type of Paramedic of [place];Living will  Does patient want to make changes to medical advance directive? No - Guardian declined  Copy of [place] in Chart? Yes - validated most recent copy scanned in chart (See row information)  Pre-existing out of facility DNR order (yellow form or pink MOST form) -     Chief Complaint  Patient presents with  . Medical Management of Chronic Issues    Patient returns to the clinic for follow up    HPI: Patient is a 87 y.o. female seen today for medical management of chronic diseases.    Has  h/o Diverticulosis Still has loose Stools sometimes Saw Dr Henrene Pastor in GI. Did not recommend Colonoscopy Gave her prescription of Cipro to use of needed  Keratosis on Right Shin Was seen by dermatology Macular Degeneration HLD wants to know if she can take statin qod Arthritis But very active plays Golf  H/o Hematuria. Retested UA has been negative. Wants to know if we can test her again Has Hammer Toe in Right foot.  Still drives local No Falls No cane or walker   Past Medical History:  Diagnosis Date  . Allergy   . Arthritis   . Back pain   . Chronic cystitis with hematuria   . Colon polyps   . Cystocele, unspecified (CODE)   . Diverticulitis   . Dyslipidemia   . Gallstones   . Gastric ulcer   . Gastritis   . GERD (gastroesophageal reflux disease)   . Hearing loss   . Hemorrhoids   . Hyperlipidemia   . Hypertension   . Hypertensive retinopathy    OU  . Impaired fasting glucose   . Macular degeneration    Dry OU  . Melanoma in situ of neck ([place])   . Renal insufficiency     Past Surgical History:  Procedure Laterality Date  . APPENDECTOMY    . CATARACT EXTRACTION Bilateral     Dr. Loni Dolly - [place], [place]     with appendectomy  . EYE SURGERY Bilateral    Cat Sx - Dr. Loni Dolly - [place], [place]  . THROAT SURGERY     calcified ligament  . TONSILLECTOMY      Allergies  Allergen Reactions  . Oysters [Shellfish Allergy] Swelling  . Sulfa Antibiotics     CANNOT REMEMBER REACTION    Outpatient Encounter Medications as of 03/20/2021  Medication Sig  . Multiple Vitamins-Minerals (ICAPS AREDS 2 PO) Take 1 tablet by mouth 2 (two) times daily.  . pravastatin (PRAVACHOL) 20 MG tablet Take 1 tablet (20 mg total) by mouth daily.  . [DISCONTINUED] betamethasone dipropionate 0.05 % lotion Apply topically.  . [DISCONTINUED] ciprofloxacin (CIPRO) 500 MG tablet Take 1 tablet (500 mg total) by mouth 2 (two) times daily.   No facility-administered encounter medications on file as of 03/20/2021.    Review of Systems:  Review of Systems  Review of Systems  Constitutional: Negative for activity change, appetite change, chills, diaphoresis, fatigue and fever.  HENT: Negative for mouth sores, postnasal drip, rhinorrhea, sinus pain and sore throat.   Respiratory: Negative for apnea, cough, chest tightness, shortness of breath and wheezing.   Cardiovascular: Negative for chest pain, palpitations and leg swelling.  Gastrointestinal: Negative for abdominal distention, abdominal pain, constipation, diarrhea, nausea and vomiting.  Genitourinary: Negative for dysuria and frequency.  Musculoskeletal: Negative for arthralgias, joint swelling and myalgias.  Skin: Negative for rash.  Neurological: Negative for dizziness, syncope, weakness, light-headedness and numbness.  Psychiatric/Behavioral: Negative for behavioral problems, confusion and sleep disturbance.     Health Maintenance  Topic Date Due  . DEXA SCAN  05/09/2021 (Originally 11/04/1998)  . TETANUS/TDAP  05/09/2021 (Originally 11/04/1950)  . PNA vac Low Risk Adult (1 of 2 - PCV13) 05/09/2021 (Originally 11/04/1998)   . INFLUENZA VACCINE  05/27/2021  . COVID-19 Vaccine  Completed  . HPV VACCINES  Aged Out    Physical Exam: Vitals:   03/20/21 1047  BP: (!) 146/68  Pulse: (!) 58  Temp: (!) 96 F (35.6 C)  SpO2: 100%  Weight: 138 lb 9.6 oz (62.9 kg)  Height: 5\' 1"  (1.549 m)   Body mass index is 26.19 kg/m. Physical Exam  Constitutional: Oriented to person, place, and time. Well-developed and well-nourished.  HENT:  Head: Normocephalic.  Mouth/Throat: Oropharynx is clear and moist.  Eyes: Pupils are equal, round, and reactive to light.  Neck: Neck supple.  Cardiovascular: Normal rate and normal heart sounds.  No murmur heard. Pulmonary/Chest: Effort normal and breath sounds normal. No respiratory distress. No wheezes. She has no rales.  Abdominal: Soft. Bowel sounds are normal. No distension. There is no tenderness. There is no rebound.  Musculoskeletal: No edema. Has Right Hammer second  Toe  Lymphadenopathy: none Neurological: Alert and oriented to person, place, and time.  Skin: Skin is warm and dry. Small area of keratosis in Right shin area Psychiatric: Normal mood and affect. Behavior is normal. Thought content normal.    Labs reviewed: Basic Metabolic Panel: Recent Labs    05/01/20 1033  NA 139  K 4.8  CL 106  CO2 23*  BUN 12  CREATININE 0.6  CALCIUM 9.1  TSH 3.55   Liver Function Tests: Recent Labs    05/01/20 1033  AST 15  ALT 10  ALKPHOS 90   No results for input(s): LIPASE, AMYLASE in the last 8760 hours. No results for input(s): AMMONIA in the last 8760 hours. CBC: Recent Labs    05/01/20 1033  WBC 6.1  HGB 13.3  HCT 40  PLT 196   Lipid Panel: Recent Labs    05/01/20 0000 07/31/20 0000  CHOL 264* 191  HDL 45 47  LDLCALC 140 99  TRIG 398* 226*   No results found for: HGBA1C  Procedures since last visit: No results found.  Assessment/Plan 1. Pure hypercholesterolemia Can change to Every other day Using for Primary prevention.   2.  Macular degeneration of both eyes, unspecified type Follows with Opthalmology  3. H/O hematuria Repeat UA  4. Diverticulosis Has Cipro Prescription by GI  5 Actinic Keratosis Vaslien and Hydrocortisone PRN 6 Hammer Toe Referral to Podiatry  Has refused vaccination to Dr Mariea Clonts before. Will discuss again as she was late for her Appointment  Labs/tests ordered:  Lipid Panel,UA,CBC,CMP,TSh Next appt:  09/16/2021

## 2021-03-21 LAB — COMPREHENSIVE METABOLIC PANEL
Albumin: 4.2 (ref 3.5–5.0)
Calcium: 9.1 (ref 8.7–10.7)
Globulin: 2.2

## 2021-03-21 LAB — LIPID PANEL
Cholesterol: 150 (ref 0–200)
HDL: 44 (ref 35–70)
LDL Cholesterol: 70
LDl/HDL Ratio: 3.4
Triglycerides: 182 — AB (ref 40–160)

## 2021-03-21 LAB — HEPATIC FUNCTION PANEL
ALT: 11 (ref 7–35)
AST: 16 (ref 13–35)
Alkaline Phosphatase: 107 (ref 25–125)
Bilirubin, Total: 0.4

## 2021-03-21 LAB — BASIC METABOLIC PANEL
BUN: 13 (ref 4–21)
CO2: 24 — AB (ref 13–22)
Chloride: 105 (ref 99–108)
Creatinine: 0.7 (ref 0.5–1.1)
Glucose: 119
Potassium: 4.2 (ref 3.4–5.3)
Sodium: 141 (ref 137–147)

## 2021-03-21 LAB — TSH: TSH: 2.9 (ref 0.41–5.90)

## 2021-03-21 LAB — CBC: RBC: 4.61 (ref 3.87–5.11)

## 2021-03-21 LAB — CBC AND DIFFERENTIAL
HCT: 38 (ref 36–46)
Hemoglobin: 13.5 (ref 12.0–16.0)
Platelets: 212 (ref 150–399)
WBC: 5.5

## 2021-03-22 ENCOUNTER — Telehealth: Payer: Self-pay | Admitting: Internal Medicine

## 2021-03-22 NOTE — Telephone Encounter (Signed)
Called and discussed with the patient. No further questions.

## 2021-03-22 NOTE — Telephone Encounter (Signed)
Please Call Yolanda Burke and Let her know that all her Labs are good. Her Cholesterol is good also. And She has no Blood in her Urine

## 2021-03-27 ENCOUNTER — Encounter: Payer: Medicare Other | Admitting: Internal Medicine

## 2021-04-09 ENCOUNTER — Ambulatory Visit: Payer: Medicare Other | Admitting: Podiatry

## 2021-04-11 ENCOUNTER — Other Ambulatory Visit: Payer: Self-pay

## 2021-04-11 ENCOUNTER — Ambulatory Visit (INDEPENDENT_AMBULATORY_CARE_PROVIDER_SITE_OTHER): Payer: Medicare Other | Admitting: Podiatry

## 2021-04-11 ENCOUNTER — Encounter: Payer: Self-pay | Admitting: Podiatry

## 2021-04-11 ENCOUNTER — Ambulatory Visit (INDEPENDENT_AMBULATORY_CARE_PROVIDER_SITE_OTHER): Payer: Medicare Other

## 2021-04-11 DIAGNOSIS — M2042 Other hammer toe(s) (acquired), left foot: Secondary | ICD-10-CM | POA: Diagnosis not present

## 2021-04-11 DIAGNOSIS — M2041 Other hammer toe(s) (acquired), right foot: Secondary | ICD-10-CM | POA: Diagnosis not present

## 2021-04-11 DIAGNOSIS — L600 Ingrowing nail: Secondary | ICD-10-CM

## 2021-04-11 DIAGNOSIS — M778 Other enthesopathies, not elsewhere classified: Secondary | ICD-10-CM

## 2021-04-11 MED ORDER — TRIAMCINOLONE ACETONIDE 10 MG/ML IJ SUSP
10.0000 mg | Freq: Once | INTRAMUSCULAR | Status: AC
  Administered 2021-04-11: 10 mg

## 2021-04-11 NOTE — Progress Notes (Signed)
Subjective:   Patient ID: Yolanda Burke, female   DOB: 87 y.o.   MRN: [id number]   HPI Patient presents on referral with a lot of pain in the right forefoot of several months duration and also has had a long-term history of elevation second toe with irritation.  Patient states that this is been ongoing and getting worse and patient does not smoke and still tries to stay active   Review of Systems  All other systems reviewed and are negative.      Objective:  Physical Exam Vitals and nursing note reviewed.  Constitutional:      Appearance: She is well-developed.  Pulmonary:     Effort: Pulmonary effort is normal.  Musculoskeletal:        General: Normal range of motion.  Skin:    General: Skin is warm.  Neurological:     Mental Status: She is alert.    Neurovascular status intact muscle strength adequate range of motion adequate with inflammation pain around the second MPJ right with fluid buildup around the joint surface that is painful when pressed.  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  We discussed different treatment options at this point I did do a periarticular injection around the second MPJ 3 mg Dexasone Kenalog 5 mg Xylocaine I have padded the second digit discussed digital correction and discussed stabilization procedure.  Patient will hold off may require more aggressive surgery depending on response to this conservative treatment plan which was discussed in great detail  X-rays indicate there is rigid contracture digit to right no signs of arthritis stress fracture formation

## 2021-04-17 ENCOUNTER — Other Ambulatory Visit: Payer: Self-pay | Admitting: Internal Medicine

## 2021-04-17 DIAGNOSIS — H9113 Presbycusis, bilateral: Secondary | ICD-10-CM

## 2021-06-14 NOTE — Progress Notes (Signed)
[facility] Note  06/18/2021     CHIEF COMPLAINT Patient presents for Retina Follow Up   HISTORY OF PRESENT ILLNESS: Yolanda Burke is a 87 y.o. female who presents to the clinic today for:   HPI     Retina Follow Up   Patient presents with  Dry AMD.  In both eyes.  Duration of 4 months.  Since onset it is gradually worsening.  I, the attending physician,  performed the HPI with the patient and updated documentation appropriately.        Comments   Pt here for 19moret f/u for non exu ARMD OU. Pt states she feels vision may be getting a bit worse though she admits feeling that each time and results not reflecting that. Reports dimming in vision. 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       Last edited by ZBernarda Caffey MD on 06/23/2021  1:05 AM.    Pt feels like vision is a little worse  Referring physician: GVirgie Dad MD [address]  [place] [phone] HISTORICAL INFORMATION:   Selected notes from the MEDICAL RECORD NUMBER [facility] retina specialists LEE:  Ocular Hx- PMH-    CURRENT MEDICATIONS: No current outpatient medications on file. (Ophthalmic Drugs)   No current facility-administered medications for this visit. (Ophthalmic Drugs)   Current Outpatient Medications (Other)  Medication Sig   Multiple Vitamins-Minerals (ICAPS AREDS 2 PO) Take 1 tablet by mouth 2 (two) times daily.   pravastatin (PRAVACHOL) 20 MG tablet Take 1 tablet (20 mg total) by mouth daily.   No current facility-administered medications for this visit. (Other)   REVIEW OF SYSTEMS: ROS   Positive for: Gastrointestinal, Skin, Genitourinary, Musculoskeletal, HENT, Eyes Negative for: Constitutional, Neurological, Endocrine, Cardiovascular, Respiratory, Psychiatric, Allergic/Imm, Heme/Lymph Last edited by SKingsley Spittle COT on 06/18/2021  9:20 AM.       ALLERGIES Allergies  Allergen Reactions   Oysters [Shellfish Allergy] Swelling   Sulfa Antibiotics     CANNOT REMEMBER REACTION    PAST MEDICAL HISTORY Past Medical History:  Diagnosis Date   Allergy    Arthritis    Back pain    Chronic cystitis with hematuria    Colon polyps    Cystocele, unspecified (CODE)    Diverticulitis    Dyslipidemia    Gallstones    Gastric ulcer    Gastritis    GERD (gastroesophageal reflux disease)    Hearing loss    Hemorrhoids    Hyperlipidemia    Hypertension    Hypertensive retinopathy    OU   Impaired fasting glucose    Macular degeneration    Dry OU   Melanoma in situ of neck ([place]    Renal insufficiency    Past Surgical History:  Procedure Laterality Date   APPENDECTOMY     CATARACT EXTRACTION Bilateral    [place]    with [place]   EYE SURGERY Bilateral    Cat Sx - Dr. [place]oni D[place]    calcified ligament   TONSILLECTOMY      FAMILY HISTORY Family History  Problem Relation Age of Onset   Heart failure Mother    Dementia Mother    Pneumonia Father    Kidney failure Father    Heart disease Brother  Bladder Cancer Brother     SOCIAL HISTORY Social History   Tobacco Use   Smoking status: Former    Types: Cigarettes    Quit date: 03/14/1985    Years since quitting: 36.3   Smokeless tobacco: Never   Tobacco comments:    Smoked until age 81  Vaping Use   Vaping Use: Never used  Substance Use Topics   Alcohol use: Yes    Comment: social-occasional   Drug use: Never         OPHTHALMIC EXAM:  Base Eye Exam     Visual Acuity (Snellen - Linear)       Right Left   Dist cc 20/60 -2 20/100 -1   Dist ph cc NI 20/80 +1    Correction: Glasses         Tonometry (Tonopen, 9:34 AM)       Right Left   Pressure 13 11         Pupils       Dark Light Shape React APD   Right 3 2 Round Brisk None   Left 3 2 Round Brisk None          Visual Fields (Counting fingers)       Left Right    Full Full         Extraocular Movement       Right Left    Full, Ortho Full, Ortho         Neuro/Psych     Oriented x3: Yes   Mood/Affect: Normal         Dilation     Both eyes: 1.0% Mydriacyl, 2.5% Phenylephrine @ 9:35 AM           Slit Lamp and Fundus Exam     Slit Lamp Exam       Right Left   Lids/Lashes Dermatochalasis - upper lid Dermatochalasis - upper lid   Conjunctiva/Sclera White and quiet White and quiet   Cornea arcus, trace Punctate epithelial erosions arcus, 1-2+fine, inferior Punctate epithelial erosions   Anterior Chamber Deep and quiet Deep and quiet   Iris Round and dilated Round and dilated   Lens Posterior chamber intraocular lens, 1+ non-central Posterior capsular opacification Posterior chamber intraocular lens, 1+non-central Posterior capsular opacification   Vitreous Vitreous syneresis Vitreous syneresis         Fundus Exam       Right Left   Disc Pink and Sharp, Compact Pink and Sharp, Compact   C/D Ratio 0.3 0.1   Macula Blunted foveal reflex, central GA with refractile subretinal deposits within, scattered drusen, no heme Blunted foveal reflex, central GA with pigment clumping, scattered drusen, no heme or edema   Vessels attenuated, mild tortuousity attenuated, mild tortuousity   Periphery Attached, no heme    Attached, no heme             Refraction     Wearing Rx       Sphere Cylinder Axis Add   Right -2.25 +2.00 015 +3.00   Left -2.25 +2.75 155 +3.00         Manifest Refraction       Sphere Cylinder Axis Dist VA   Right -2.50 +2.25 180 20/70-1   Left -2.25 +2.75 155 20/80-2            IMAGING AND PROCEDURES  Imaging and Procedures for 06/18/2021  OCT, Retina - OU - Both Eyes       Right Eye Quality  was good. Central Foveal Thickness: 246. Progression has been stable. Findings include abnormal foveal contour, no IRF, no SRF,  subretinal hyper-reflective material, intraretinal hyper-reflective material, retinal drusen , pigment epithelial detachment, outer retinal atrophy (Severe central thinning with SRHM / scarring - stable from prior).   Left Eye Quality was good. Central Foveal Thickness: 128. Progression has been stable. Findings include abnormal foveal contour, no IRF, no SRF, retinal drusen , pigment epithelial detachment, outer retinal atrophy, inner retinal atrophy (Severe central thinning / atrophy).   Notes *Images captured and stored on drive  Diagnosis / Impression:  non-exu ARMD OU OD: Severe central thinning with SRHM / scarring OS: Severe central thinning / atrophy  Clinical management:  See below  Abbreviations: NFP - Normal foveal profile. CME - cystoid macular edema. PED - pigment epithelial detachment. IRF - intraretinal fluid. SRF - subretinal fluid. EZ - ellipsoid zone. ERM - epiretinal membrane. ORA - outer retinal atrophy. ORT - outer retinal tubulation. SRHM - subretinal hyper-reflective material. IRHM - intraretinal hyper-reflective material            ASSESSMENT/PLAN:    ICD-10-CM   1. Advanced atrophic nonexudative age-related macular degeneration of both eyes with subfoveal involvement  H35.3134     2. Retinal edema  H35.81 OCT, Retina - OU - Both Eyes    3. Essential hypertension  I10     4. Hypertensive retinopathy of both eyes  H35.033     5. Pseudophakia of both eyes  Z96.1       1,2. Age related macular degeneration, non-exudative, both eyes  - former pt at [facility]) and in [place]; moved from [place] in 2020)  - history of intravitreal injections with [facility], last being ~2016  - exam shows central GA OU, OD with central refractile drusen and scarring  - Recommend continued amsler grid monitoring  - f/u 6 months, DFE, OCT  3,4. Hypertensive retinopathy OU - discussed importance of tight BP control - monitor  5. Pseudophakia OU  - s/p  CE/IOL OU (Dr. Loni Dolly, [place], [place])  - IOL in good position, doing well  - monitor  Ophthalmic Meds Ordered this visit:  No orders of the defined types were placed in this encounter.      Return in about 6 months (around 12/19/2021) for f/u non-exu ARMD OU, DFE, OCT.  There are no Patient Instructions on file for this visit.   Explained the diagnoses, plan, and follow up with the patient and they expressed understanding.  Patient expressed understanding of the importance of proper follow up care.   This document serves as a record of services personally performed by Gardiner Sleeper, MD, PhD. It was created on their behalf by San Jetty. Owens Shark, OA an ophthalmic technician. The creation of this record is the provider's dictation and/or activities during the visit.    Electronically signed by: San Jetty. Owens Shark, [place] 08.19.2022 1:11 AM   Gardiner Sleeper, M.D., Ph.D. Diseases & Surgery of the Retina and Vitreous [facility]  I have reviewed the above documentation for accuracy and completeness, and I agree with the above. Gardiner Sleeper, M.D., Ph.D. 06/23/21 1:11 AM   Abbreviations: M myopia (nearsighted); A astigmatism; H hyperopia (farsighted); P presbyopia; Mrx spectacle prescription;  CTL contact lenses; OD right eye; OS left eye; OU both eyes  XT exotropia; ET esotropia; PEK punctate epithelial keratitis; PEE punctate epithelial erosions; DES dry eye syndrome; MGD meibomian gland dysfunction; ATs artificial tears;  PFAT's preservative free artificial tears; [place] nuclear sclerotic cataract; PSC posterior subcapsular cataract; ERM epi-retinal membrane; PVD posterior vitreous detachment; RD retinal detachment; DM diabetes mellitus; DR diabetic retinopathy; NPDR non-proliferative diabetic retinopathy; PDR proliferative diabetic retinopathy; CSME clinically significant macular edema; DME diabetic macular edema; dbh dot blot hemorrhages; CWS cotton wool spot; POAG primary  open angle glaucoma; C/D cup-to-disc ratio; HVF humphrey visual field; GVF goldmann visual field; OCT optical coherence tomography; IOP intraocular pressure; BRVO Branch retinal vein occlusion; CRVO central retinal vein occlusion; CRAO central retinal artery occlusion; BRAO branch retinal artery occlusion; RT retinal tear; SB scleral buckle; PPV pars plana vitrectomy; VH Vitreous hemorrhage; PRP panretinal laser photocoagulation; IVK intravitreal kenalog; VMT vitreomacular traction; MH Macular hole;  NVD neovascularization of the disc; NVE neovascularization elsewhere; AREDS age related eye disease study; ARMD age related macular degeneration; POAG primary open angle glaucoma; EBMD epithelial/anterior basement membrane dystrophy; ACIOL anterior chamber intraocular lens; IOL intraocular lens; PCIOL posterior chamber intraocular lens; Phaco/IOL phacoemulsification with intraocular lens placement; [place] photorefractive keratectomy; LASIK laser assisted in situ keratomileusis; HTN hypertension; DM diabetes mellitus; COPD chronic obstructive pulmonary disease

## 2021-06-18 ENCOUNTER — Ambulatory Visit (INDEPENDENT_AMBULATORY_CARE_PROVIDER_SITE_OTHER): Payer: Medicare Other | Admitting: Ophthalmology

## 2021-06-18 ENCOUNTER — Other Ambulatory Visit: Payer: Self-pay

## 2021-06-18 ENCOUNTER — Encounter (INDEPENDENT_AMBULATORY_CARE_PROVIDER_SITE_OTHER): Payer: Self-pay | Admitting: Ophthalmology

## 2021-06-18 DIAGNOSIS — I10 Essential (primary) hypertension: Secondary | ICD-10-CM

## 2021-06-18 DIAGNOSIS — H3581 Retinal edema: Secondary | ICD-10-CM

## 2021-06-18 DIAGNOSIS — Z961 Presence of intraocular lens: Secondary | ICD-10-CM

## 2021-06-18 DIAGNOSIS — H35033 Hypertensive retinopathy, bilateral: Secondary | ICD-10-CM | POA: Diagnosis not present

## 2021-06-18 DIAGNOSIS — H353134 Nonexudative age-related macular degeneration, bilateral, advanced atrophic with subfoveal involvement: Secondary | ICD-10-CM

## 2021-06-19 ENCOUNTER — Other Ambulatory Visit: Payer: Self-pay

## 2021-06-19 ENCOUNTER — Emergency Department (HOSPITAL_BASED_OUTPATIENT_CLINIC_OR_DEPARTMENT_OTHER)
Admission: EM | Admit: 2021-06-19 | Discharge: 2021-06-19 | Disposition: A | Discharge status: Home / Self Care | Payer: Medicare Other | Attending: Emergency Medicine | Admitting: Emergency Medicine

## 2021-06-19 ENCOUNTER — Encounter (HOSPITAL_BASED_OUTPATIENT_CLINIC_OR_DEPARTMENT_OTHER): Payer: Self-pay | Admitting: Emergency Medicine

## 2021-06-19 DIAGNOSIS — Z87891 Personal history of nicotine dependence: Secondary | ICD-10-CM | POA: Diagnosis not present

## 2021-06-19 DIAGNOSIS — I10 Essential (primary) hypertension: Secondary | ICD-10-CM | POA: Diagnosis not present

## 2021-06-19 DIAGNOSIS — D492 Neoplasm of unspecified behavior of bone, soft tissue, and skin: Secondary | ICD-10-CM

## 2021-06-19 DIAGNOSIS — R2241 Localized swelling, mass and lump, right lower limb: Secondary | ICD-10-CM | POA: Insufficient documentation

## 2021-06-19 NOTE — ED Provider Notes (Signed)
[facility] Provider Note   CSN: [id number] Arrival date & time: 06/19/21  1240     History Chief Complaint  Patient presents with   Insect Bite    Yolanda Burke is a 87 y.o. female.  HPI Patient has a nodule on the back of her right leg.  She reports about a month ago it started like a bug bite.  She reports it was not too severe.  She reports over the past several days or week now it is started to get bigger and she was concerned that it might have an infection in it.     Past Medical History:  Diagnosis Date   Allergy    Arthritis    Back pain    Chronic cystitis with hematuria    Colon polyps    Cystocele, unspecified (CODE)    Diverticulitis    Dyslipidemia    Gallstones    Gastric ulcer    Gastritis    GERD (gastroesophageal reflux disease)    Hearing loss    Hemorrhoids    Hyperlipidemia    Hypertension    Hypertensive retinopathy    OU   Impaired fasting glucose    Macular degeneration    Dry OU   Melanoma in situ of neck [facility])    Renal insufficiency     Patient Active Problem List   Diagnosis Date Noted   Mouth droop due to facial weakness 05/10/2020   Arthritis of carpometacarpal [facility]) joint of left thumb 11/23/2019   Vocal tremor 11/23/2019   Personal history of malignant melanoma of skin 05/30/2019   Pure hypercholesterolemia 05/30/2019   Mixed stress and urge urinary incontinence 05/30/2019   Presbycusis of both ears 05/30/2019   H/O hematuria 05/30/2019   Diverticulosis 05/25/2019   Macular degeneration of both eyes    GERD (gastroesophageal reflux disease)     Past Surgical History:  Procedure Laterality Date   APPENDECTOMY     CATARACT EXTRACTION Bilateral    Dr. Loni Dolly - [place], [place]     with appendectomy   EYE SURGERY Bilateral    Cat Sx - Dr. Loni Dolly - [place], [place]     calcified ligament   TONSILLECTOMY       OB History   No obstetric history on file.      Family History  Problem Relation Age of Onset   Heart failure Mother    Dementia Mother    Pneumonia Father    Kidney failure Father    Heart disease Brother    Bladder Cancer Brother     Social History   Tobacco Use   Smoking status: Former    Types: Cigarettes    Quit date: 03/14/1985    Years since quitting: 36.2   Smokeless tobacco: Never   Tobacco comments:    Smoked until age 75  Vaping Use   Vaping Use: Never used  Substance Use Topics   Alcohol use: Yes    Comment: social-occasional   Drug use: Never    Home Medications Prior to Admission medications   Medication Sig Start Date End Date Taking? Authorizing Provider  Multiple Vitamins-Minerals (ICAPS AREDS 2 PO) Take 1 tablet by mouth 2 (two) times daily.    [provider]  pravastatin (PRAVACHOL) 20 MG tablet Take 1 tablet (20 mg total) by mouth daily. 10/31/20   Reed, Tiffany L, DO    Allergies    Oysters [shellfish allergy] and Sulfa  antibiotics  Review of Systems   Review of Systems Constitutional: No fever no chills no malaise  Physical Exam Updated Vital Signs BP (!) 148/70 (BP Location: Right Arm)   Pulse 90   Temp 98.5 F (36.9 C) (Oral)   Resp 16   Ht '5\' 2"'$  (1.575 m)   Wt 61.7 kg   SpO2 98%   BMI 24.87 kg/m   Physical Exam Constitutional:      Comments: Alert nontoxic well in appearance  Pulmonary:     Effort: Pulmonary effort is normal.  Skin:    General: Skin is warm and dry.     Comments: Patient has a mounded nodule that is firm.  There is no fluctuance.  Margins are slightly irregular.  See attached images       ED Results / Procedures / Treatments   Labs (all labs ordered are listed, but only abnormal results are displayed) Labs Reviewed - No data to display  EKG None  Radiology OCT, Retina - OU - Both Eyes  Result Date: 06/18/2021 Right Eye Quality was good. Central Foveal Thickness: 246. Progression has been stable. Findings include abnormal foveal  contour, no IRF, no SRF, subretinal hyper-reflective material, intraretinal hyper-reflective material, retinal drusen , pigment epithelial detachment, outer retinal atrophy (Severe central thinning with SRHM / scarring - stable from prior). Left Eye Quality was good. Central Foveal Thickness: 128. Progression has been stable. Findings include abnormal foveal contour, no IRF, no SRF, retinal drusen , pigment epithelial detachment, outer retinal atrophy, inner retinal atrophy (Severe central thinning / atrophy). Notes *Images captured and stored on drive Diagnosis / Impression: non-exu ARMD OU OD: Severe central thinning with SRHM / scarring OS: Severe central thinning / atrophy Clinical management: See below Abbreviations: NFP - Normal foveal profile. CME - cystoid macular edema. PED - pigment epithelial detachment. IRF - intraretinal fluid. SRF - subretinal fluid. EZ - ellipsoid zone. ERM - epiretinal membrane. ORA - outer retinal atrophy. ORT - outer retinal tubulation. SRHM - subretinal hyper-reflective material. IRHM - intraretinal hyper-reflective material    Procedures Procedures   Medications Ordered in ED Medications - No data to display  ED Course  I have reviewed the triage vital signs and the nursing notes.  Pertinent labs & imaging results that were available during my care of the patient were reviewed by me and considered in my medical decision making (see chart for details).    MDM Rules/Calculators/A&P                           Patient presents with skin nodule that she had concerned might be an infected bug bite.  The nodules been present for a month and evolving.  By appearance it is a solid nodular skin lesion without suggestion of abscess or cellulitis.  Counseled the patient my main concern would be for a possible skin neoplasm and that this needs to be evaluated by dermatology.  I strongly advise against any attempt to stick a needle or drain it at this time.  There is no  fluctuance.  Patient voices understanding. Final Clinical Impression(s) / ED Diagnoses Final diagnoses:  Skin tumor    Rx / DC Orders ED Discharge Orders     None        Charlesetta Shanks, MD 06/19/21 1453

## 2021-06-19 NOTE — ED Triage Notes (Signed)
Pt arrives to ED with c/o of insect bite to right calf. The bite occurred about a month ago. The area is raised, red, and painful and has not gone away. Today the leg hurts.

## 2021-06-19 NOTE — Discharge Instructions (Signed)
1.  At this time your lesion appears to be a small skin tumor.  It does not show signs of being an abscess or infection.  This needs to be examined by dermatologist to determine if a biopsy or excision is needed.

## 2021-06-23 ENCOUNTER — Encounter (INDEPENDENT_AMBULATORY_CARE_PROVIDER_SITE_OTHER): Payer: Self-pay | Admitting: Ophthalmology

## 2021-09-16 ENCOUNTER — Other Ambulatory Visit: Payer: Self-pay

## 2021-09-16 ENCOUNTER — Non-Acute Institutional Stay: Payer: Medicare Other | Admitting: Adult Health

## 2021-09-16 ENCOUNTER — Encounter: Payer: Self-pay | Admitting: Adult Health

## 2021-09-16 VITALS — BP 156/82 | HR 70 | Temp 96.4°F | Ht 62.0 in | Wt 137.6 lb

## 2021-09-16 DIAGNOSIS — N3946 Mixed incontinence: Secondary | ICD-10-CM

## 2021-09-16 DIAGNOSIS — E7800 Pure hypercholesterolemia, unspecified: Secondary | ICD-10-CM

## 2021-09-16 DIAGNOSIS — I872 Venous insufficiency (chronic) (peripheral): Secondary | ICD-10-CM

## 2021-09-16 DIAGNOSIS — K219 Gastro-esophageal reflux disease without esophagitis: Secondary | ICD-10-CM

## 2021-09-16 DIAGNOSIS — K5790 Diverticulosis of intestine, part unspecified, without perforation or abscess without bleeding: Secondary | ICD-10-CM

## 2021-09-16 DIAGNOSIS — R030 Elevated blood-pressure reading, without diagnosis of hypertension: Secondary | ICD-10-CM

## 2021-09-16 DIAGNOSIS — Z87448 Personal history of other diseases of urinary system: Secondary | ICD-10-CM

## 2021-09-16 NOTE — Progress Notes (Signed)
Location [facility]  POS: Clinic   Provider:   Cindi Carbon, [place] [phone]   Virgie Dad, MD  Patient Care Team: Virgie Dad, MD as PCP - General (Internal Medicine)  Extended Emergency Contact Information Primary Emergency Contact: Althea Grimmer III Address: [address]          [place], [place] [address] Johnnette Litter of [place] Mobile Phone: [phone] Relation: Son  Code Status:   Goals of care: Advanced Directive information Advanced Directives 06/19/2021  Does Patient Have a Medical Advance Directive? Yes  Type of Paramedic of [place];Living will  Does patient want to make changes to medical advance directive? No - Patient declined  Copy of [place] in Chart? -  Pre-existing out of facility DNR order (yellow form or pink MOST form) -     Chief Complaint  Patient presents with   Medical Management of Chronic Issues    Patient returns to the follow up. Patient has no concerns.    Quality Metric Gaps    Shingrix, TDAP    HPI:  Pt is a 87 y.o. female seen today for medical management of chronic diseases.   PMH significant for diverticulosis, loose stools, HLD, macular degeneration, HLD, arthritis, hammer toe, chronic cystitis, gastric ulcer, gallstones, HTN, GERD, hearing loss .  LDL 70 5/22, triglycerides 182, statin changed to QOD per pt request in May of 2022. She was having some joint pain and that has improved.   Seen in the ED for a skin lesion 06/19/21 with recommendation to f/u with dermatology. The area on the right lower leg was removed by derm and it was not cancerous. She doesn't remember the type of lesion.   She has chronic edema in the RLE which has not changed. Ultra sound of RLE in Jan of 2022 showed no acute process.   BP was slightly elevated. She walked to the apt. Typically lower per pt.   Sees urology once a year due to prior hx of hematuria. No dysuria  or bladder pain. Has some leakage.   She is doing an 8 week walking program.   Has not had any issues with diverticulitis. No need to take antibiotics.   Past Medical History:  Diagnosis Date   Allergy    Arthritis    Back pain    Chronic cystitis with hematuria    Colon polyps    Cystocele, unspecified (CODE)    Diverticulitis    Dyslipidemia    Gallstones    Gastric ulcer    Gastritis    GERD (gastroesophageal reflux disease)    Hearing loss    Hemorrhoids    Hyperlipidemia    Hypertension    Hypertensive retinopathy    OU   Impaired fasting glucose    Macular degeneration    Dry OU   Melanoma in situ of neck ([place])    Renal insufficiency    Past Surgical History:  Procedure Laterality Date   APPENDECTOMY     CATARACT EXTRACTION Bilateral    Dr. Loni Dolly - Evette Doffing, [place]     with appendectomy   EYE SURGERY Bilateral    Cat Sx - Dr. Loni Dolly - [place], [place]   THROAT SURGERY     calcified ligament   TONSILLECTOMY      Allergies  Allergen Reactions   Oysters [Shellfish Allergy] Swelling   Sulfa Antibiotics     CANNOT REMEMBER REACTION  Outpatient Encounter Medications as of 09/16/2021  Medication Sig   Multiple Vitamins-Minerals (ICAPS AREDS 2 PO) Take 1 tablet by mouth 2 (two) times daily.   pravastatin (PRAVACHOL) 20 MG tablet Take 1 tablet (20 mg total) by mouth daily.   No facility-administered encounter medications on file as of 09/16/2021.    Review of Systems  Constitutional:  Negative for activity change, appetite change, chills, diaphoresis, fatigue, fever and unexpected weight change.  HENT:  Negative for congestion.   Respiratory:  Negative for cough, shortness of breath and wheezing.   Cardiovascular:  Positive for leg swelling (slight on right which is chronic). Negative for chest pain and palpitations.  Gastrointestinal:  Negative for abdominal distention, abdominal pain, constipation and diarrhea.  Genitourinary:  Negative  for difficulty urinating and dysuria.  Musculoskeletal:  Negative for arthralgias, back pain, gait problem, joint swelling and myalgias.  Neurological:  Negative for dizziness, tremors, seizures, syncope, facial asymmetry, speech difficulty, weakness, light-headedness, numbness and headaches.  Psychiatric/Behavioral:  Negative for agitation, behavioral problems and confusion.    Immunization History  Administered Date(s) Administered   Fluad Quad(high Dose 65+) 08/30/2021   Influenza, High Dose Seasonal PF 08/05/2019, 08/31/2020   Influenza-Unspecified 10/27/2017   Moderna Sars-Covid-2 Vaccination 11/06/2019, 12/06/2019, 09/11/2020, 04/06/2021   Pertinent  Health Maintenance Due  Topic Date Due   DEXA SCAN  Never done   INFLUENZA VACCINE  Completed   Fall Risk 05/09/2020 10/31/2020 03/20/2021 06/19/2021 09/16/2021  Falls in the past year? 0 0 0 - 0  Was there an injury with Fall? 0 0 - - 0  Fall Risk Category Calculator 0 0 - - 0  Fall Risk Category Low Low - - Low  Patient Fall Risk Level Low fall risk - - Low fall risk Low fall risk  Patient at Risk for Falls Due to - - - - No Fall Risks  Fall risk Follow up - - - - Falls evaluation completed   Functional Status Survey:    Vitals:   09/16/21 1308  BP: (!) 156/82  Pulse: 70  Temp: (!) 96.4 F (35.8 C)  SpO2: 98%  Weight: 137 lb 9.6 oz (62.4 kg)  Height: 5\' 2"  (1.575 m)   Body mass index is 25.17 kg/m. Physical Exam Vitals and nursing note reviewed.  Constitutional:      General: She is not in acute distress.    Appearance: She is not diaphoretic.  HENT:     Head: Normocephalic and atraumatic.     Right Ear: Tympanic membrane and external ear normal.     Left Ear: Tympanic membrane and external ear normal.     Nose: Rhinorrhea present.     Mouth/Throat:     Mouth: Mucous membranes are moist.     Pharynx: Oropharynx is clear.  Eyes:     Conjunctiva/sclera: Conjunctivae normal.     Pupils: Pupils are equal, round, and  reactive to light.  Neck:     Vascular: No JVD.  Cardiovascular:     Rate and Rhythm: Normal rate and regular rhythm.     Heart sounds: No murmur heard. Pulmonary:     Effort: Pulmonary effort is normal. No respiratory distress.     Breath sounds: Normal breath sounds. No wheezing.  Abdominal:     General: Bowel sounds are normal. There is no distension.     Palpations: Abdomen is soft.  Musculoskeletal:     Cervical back: No rigidity or tenderness.     Right lower leg: Edema (  trace) present.     Left lower leg: No edema.  Lymphadenopathy:     Cervical: No cervical adenopathy.  Skin:    General: Skin is warm and dry.  Neurological:     Mental Status: She is alert and oriented to person, place, and time.  Psychiatric:        Mood and Affect: Mood normal.    Labs reviewed: Recent Labs    03/21/21 0000  NA 141  K 4.2  CL 105  CO2 24*  BUN 13  CREATININE 0.7  CALCIUM 9.1   Recent Labs    03/21/21 0000  AST 16  ALT 11  ALKPHOS 107  ALBUMIN 4.2   Recent Labs    03/21/21 0000  WBC 5.5  HGB 13.5  HCT 38  PLT 212   Lab Results  Component Value Date   TSH 2.90 03/21/2021   No results found for: HGBA1C Lab Results  Component Value Date   CHOL 150 03/21/2021   HDL 44 03/21/2021   LDLCALC 70 03/21/2021   TRIG 182 (A) 03/21/2021    Significant Diagnostic Results in last 30 days:  No results found.  Assessment/Plan  1. Elevated blood pressure reading Her bp is slightly elevated today but she walked over to the apt. Recommend weekly bp checks at home. If > 150/90 let us know.   2. Diverticulosis No current symptoms  3. Gastroesophageal reflux disease without esophagitis No current symptoms   4. Pure hypercholesterolemia Lab Results  Component Value Date   LDLCALC 70 03/21/2021   Doing well on statin qod Will check lipid at f/u visit  5. Mixed stress and urge urinary incontinence Followed by urology in the past No current issues, slight  leakage  6. H/O hematuria Not present on urine this year No current issues  7. Venous insufficiency If worsening would try compression hose Continue walking Leg elevation   Total time 79min:  time greater than 50% of total time spent doing pt counseling and coordination of care       Recommend shingrix and tdap vaccinations.  F/U in 6 months with Dr Lyndel Safe.   Labs/tests ordered:  CBC BMP Lipid in 6 months

## 2021-09-16 NOTE — Patient Instructions (Signed)
Recommend weekly bp checks at home. If > 150/90 let us know.   Recommend shingrix and tdap vaccinations.

## 2021-09-17 ENCOUNTER — Encounter: Payer: Self-pay | Admitting: Adult Health

## 2021-09-17 DIAGNOSIS — I872 Venous insufficiency (chronic) (peripheral): Secondary | ICD-10-CM | POA: Insufficient documentation

## 2021-10-21 ENCOUNTER — Other Ambulatory Visit: Payer: Self-pay | Admitting: Adult Health

## 2021-10-21 MED ORDER — MOLNUPIRAVIR EUA 200MG CAPSULE: 4.0000 | ORAL_CAPSULE | Freq: Two times a day (BID) | ORAL | 0 refills | Status: AC

## 2021-10-21 NOTE — Progress Notes (Signed)
Tested + for COVID-19. Sent prescription for Molnupiravir 800 mg Q 12 hours X 5 days. Discussed with charge nurse [facility].

## 2021-12-06 ENCOUNTER — Ambulatory Visit (HOSPITAL_COMMUNITY)
Admission: RE | Admit: 2021-12-06 | Discharge: 2021-12-06 | Disposition: A | Discharge status: Home / Self Care | Payer: Medicare Other | Source: Ambulatory Visit | Attending: Physical Medicine & Rehabilitation | Admitting: Physical Medicine & Rehabilitation

## 2021-12-06 ENCOUNTER — Other Ambulatory Visit: Payer: Self-pay

## 2021-12-06 ENCOUNTER — Other Ambulatory Visit (HOSPITAL_COMMUNITY): Payer: Self-pay | Admitting: Physical Medicine & Rehabilitation

## 2021-12-06 DIAGNOSIS — M79604 Pain in right leg: Secondary | ICD-10-CM | POA: Diagnosis not present

## 2021-12-16 NOTE — Progress Notes (Signed)
[facility] Note  12/19/2021     CHIEF COMPLAINT Patient presents for Retina Follow Up   HISTORY OF PRESENT ILLNESS: Yolanda Burke is a 88 y.o. female who presents to the clinic today for:   HPI     Retina Follow Up   Patient presents with  Dry AMD.  In both eyes.  Severity is moderate.  Duration of 6 months.  Since onset it is gradually worsening.  I, the attending physician,  performed the HPI with the patient and updated documentation appropriately.        Comments   Pt here for 6 mo ret f/u for non exu ARMD OU. Pt states she feels vision is worse since previous visit. She reports having 2 steroid injections for sciatica pain, she does think she noticed her vision worsening around that time. Pt did have AEE last week, Dr. Sherral Hammers, who told her OS was a little weaker.       Last edited by Bernarda Caffey, MD on 12/22/2021  2:10 AM.     Pt states left eye vision is down  Referring physician: Virgie Dad, MD [place],  [place] [id number]  HISTORICAL INFORMATION:   Selected notes from the MEDICAL RECORD NUMBER Former pt at [facility], pt wants to switch retina specialists LEE:  Ocular Hx- PMH-    CURRENT MEDICATIONS: No current outpatient medications on file. (Ophthalmic Drugs)   No current facility-administered medications for this visit. (Ophthalmic Drugs)   Current Outpatient Medications (Other)  Medication Sig   Multiple Vitamins-Minerals (ICAPS AREDS 2 PO) Take 1 tablet by mouth 2 (two) times daily.   pravastatin (PRAVACHOL) 20 MG tablet Take 1 tablet (20 mg total) by mouth daily. (Patient not taking: Reported on 12/19/2021)   No current facility-administered medications for this visit. (Other)   REVIEW OF SYSTEMS: ROS   Positive for: Gastrointestinal, Skin, Genitourinary, Musculoskeletal, HENT, Eyes Negative for: Constitutional, Neurological, Endocrine, Cardiovascular, Respiratory, Psychiatric, Allergic/Imm,  Heme/Lymph Last edited by Kingsley Spittle, COT on 12/19/2021  9:49 AM.     ALLERGIES Allergies  Allergen Reactions   Oysters [Shellfish Allergy] Swelling   Sulfa Antibiotics     CANNOT REMEMBER REACTION   PAST MEDICAL HISTORY Past Medical History:  Diagnosis Date   Allergy    Arthritis    Back pain    Chronic cystitis with hematuria    Colon polyps    Cystocele, unspecified (CODE)    Diverticulitis    Dyslipidemia    Gallstones    Gastric ulcer    Gastritis    GERD (gastroesophageal reflux disease)    Hearing loss    Hemorrhoids    Hyperlipidemia    Hypertension    Hypertensive retinopathy    OU   Impaired fasting glucose    Macular degeneration    Dry OU   Melanoma in situ of neck ([place])    Renal insufficiency    Past Surgical History:  Procedure Laterality Date   APPENDECTOMY     CATARACT EXTRACTION Bilateral    Dr. Loni Dolly - [place], [place]     with appendectomy   EYE SURGERY Bilateral    Cat Sx - Dr. Loni Dolly - [place], [place]   THROAT SURGERY     calcified ligament   TONSILLECTOMY     FAMILY HISTORY Family History  Problem Relation Age of Onset   Heart failure Mother    Dementia Mother    Pneumonia  Father    Kidney failure Father    Heart disease Brother    Bladder Cancer Brother    SOCIAL HISTORY Social History   Tobacco Use   Smoking status: Former    Types: Cigarettes    Quit date: 03/14/1985    Years since quitting: 36.8   Smokeless tobacco: Never   Tobacco comments:    Smoked until age 59  Vaping Use   Vaping Use: Never used  Substance Use Topics   Alcohol use: Yes    Comment: social-occasional   Drug use: Never       OPHTHALMIC EXAM:  Base Eye Exam     Visual Acuity (Snellen - Linear)       Right Left   Dist cc 20/60 -1 20/100 -1   Dist ph cc NI NI         Tonometry (Tonopen, 9:57 AM)       Right Left   Pressure 13 11         Pupils       Dark Light Shape React APD   Right 3 2 Round Brisk  None   Left 3 2 Round Brisk None         Visual Fields (Counting fingers)       Left Right    Full Full         Extraocular Movement       Right Left    Full, Ortho Full, Ortho         Neuro/Psych     Oriented x3: Yes   Mood/Affect: Normal         Dilation     Both eyes: 1.0% Mydriacyl, 2.5% Phenylephrine @ 9:58 AM           Slit Lamp and Fundus Exam     Slit Lamp Exam       Right Left   Lids/Lashes Dermatochalasis - upper lid Dermatochalasis - upper lid   Conjunctiva/Sclera White and quiet White and quiet   Cornea arcus, trace Punctate epithelial erosions arcus, 1-2+fine, inferior Punctate epithelial erosions   Anterior Chamber Deep and quiet Deep and quiet   Iris Round and dilated Round and dilated   Lens Posterior chamber intraocular lens, 1+ non-central Posterior capsular opacification Posterior chamber intraocular lens, 1+non-central Posterior capsular opacification   Anterior Vitreous Vitreous syneresis Vitreous syneresis         Fundus Exam       Right Left   Disc Pink and Sharp, Compact Pink and Sharp, Compact   C/D Ratio 0.3 0.1   Macula Blunted foveal reflex, central GA with refractile subretinal deposits within, scattered drusen, linear heme IN mac Blunted foveal reflex, central GA with pigment clumping, scattered drusen, no heme or edema   Vessels attenuated, mild tortuousity attenuated, mild tortuousity   Periphery Attached, no heme    Attached, no heme             Refraction     Wearing Rx       Sphere Cylinder Axis Add   Right -2.25 +2.00 015 +3.00   Left -2.25 +2.75 155 +3.00         Manifest Refraction       Sphere Cylinder Axis Dist VA   Right       Left -2.50 +2.75 160 20/80           IMAGING AND PROCEDURES  Imaging and Procedures for 12/19/2021  OCT, Retina - OU - Both Eyes  Right Eye Quality was good. Central Foveal Thickness: 237. Progression has been stable. Findings include abnormal foveal  contour, no IRF, no SRF, subretinal hyper-reflective material, intraretinal hyper-reflective material, retinal drusen , pigment epithelial detachment, outer retinal atrophy (Severe central thinning with SRHM / scarring - stable from prior).   Left Eye Quality was good. Central Foveal Thickness: 135. Progression has been stable. Findings include abnormal foveal contour, no IRF, no SRF, retinal drusen , pigment epithelial detachment, outer retinal atrophy, inner retinal atrophy (Severe central thinning / atrophy).   Notes *Images captured and stored on drive  Diagnosis / Impression:  non-exu ARMD OU OD: Severe central thinning with SRHM / scarring OS: Severe central thinning / atrophy  Clinical management:  See below  Abbreviations: NFP - Normal foveal profile. CME - cystoid macular edema. PED - pigment epithelial detachment. IRF - intraretinal fluid. SRF - subretinal fluid. EZ - ellipsoid zone. ERM - epiretinal membrane. ORA - outer retinal atrophy. ORT - outer retinal tubulation. SRHM - subretinal hyper-reflective material. IRHM - intraretinal hyper-reflective material            ASSESSMENT/PLAN:    ICD-10-CM   1. Advanced atrophic nonexudative age-related macular degeneration of both eyes with subfoveal involvement  H35.3134 OCT, Retina - OU - Both Eyes    2. Essential hypertension  I10     3. Hypertensive retinopathy of both eyes  H35.033     4. Pseudophakia of both eyes  Z96.1      1. Age related macular degeneration, non-exudative, both eyes  - former pt at [facility]) and in [place]; moved from [place] in 2020)  - history of intravitreal injections with [employer], last being ~2016  - exam shows central GA OU, OD with central refractile drusen and scarring  - BCVA 20/60 OD (stable), 20/100 from 20/80 OS  - Continue amsler grid monitoring  - f/u 6 months, DFE, OCT  2,3. Hypertensive retinopathy OU - discussed importance of tight BP control -  monitor  4. Pseudophakia OU  - s/p CE/IOL OU (Dr. Loni Dolly, [place], [place])  - IOL in good position, doing well  - monitor  Ophthalmic Meds Ordered this visit:  No orders of the defined types were placed in this encounter.     Return in about 6 months (around 06/18/2022) for f/u non-exu ARMD OU, DFE, OCT.  There are no Patient Instructions on file for this visit.   Explained the diagnoses, plan, and follow up with the patient and they expressed understanding.  Patient expressed understanding of the importance of proper follow up care.   This document serves as a record of services personally performed by Gardiner Sleeper, MD, PhD. It was created on their behalf by Roselee Nova, COMT. The creation of this record is the provider's dictation and/or activities during the visit.  Electronically signed by: Roselee Nova, COMT 12/22/21 2:16 AM  This document serves as a record of services personally performed by Gardiner Sleeper, MD, PhD. It was created on their behalf by San Jetty. Owens Shark, OA an ophthalmic technician. The creation of this record is the provider's dictation and/or activities during the visit.    Electronically signed by: San Jetty. Owens Shark, [place] 02.23.2023 2:16 AM    Gardiner Sleeper, M.D., Ph.D. Diseases & Surgery of the Retina and Vitreous [facility]  I have reviewed the above documentation for accuracy and completeness, and I agree with the above. Gardiner Sleeper, M.D., Ph.D. 12/22/21 2:16 AM  Abbreviations: M myopia (nearsighted); A astigmatism; H hyperopia (farsighted); P presbyopia; Mrx spectacle prescription;  CTL contact lenses; OD right eye; OS left eye; OU both eyes  XT exotropia; ET esotropia; PEK punctate epithelial keratitis; PEE punctate epithelial erosions; DES dry eye syndrome; MGD meibomian gland dysfunction; ATs artificial tears; PFAT's preservative free artificial tears; Central nuclear sclerotic cataract; PSC posterior subcapsular cataract; ERM  epi-retinal membrane; PVD posterior vitreous detachment; RD retinal detachment; DM diabetes mellitus; DR diabetic retinopathy; NPDR non-proliferative diabetic retinopathy; PDR proliferative diabetic retinopathy; CSME clinically significant macular edema; DME diabetic macular edema; dbh dot blot hemorrhages; CWS cotton wool spot; POAG primary open angle glaucoma; C/D cup-to-disc ratio; HVF humphrey visual field; GVF goldmann visual field; OCT optical coherence tomography; IOP intraocular pressure; BRVO Branch retinal vein occlusion; CRVO central retinal vein occlusion; CRAO central retinal artery occlusion; BRAO branch retinal artery occlusion; RT retinal tear; SB scleral buckle; PPV pars plana vitrectomy; VH Vitreous hemorrhage; PRP panretinal laser photocoagulation; IVK intravitreal kenalog; VMT vitreomacular traction; MH Macular hole;  NVD neovascularization of the disc; NVE neovascularization elsewhere; AREDS age related eye disease study; ARMD age related macular degeneration; POAG primary open angle glaucoma; EBMD epithelial/anterior basement membrane dystrophy; ACIOL anterior chamber intraocular lens; IOL intraocular lens; PCIOL posterior chamber intraocular lens; Phaco/IOL phacoemulsification with intraocular lens placement; Pringle photorefractive keratectomy; LASIK laser assisted in situ keratomileusis; HTN hypertension; DM diabetes mellitus; COPD chronic obstructive pulmonary disease

## 2021-12-19 ENCOUNTER — Encounter (INDEPENDENT_AMBULATORY_CARE_PROVIDER_SITE_OTHER): Payer: Self-pay | Admitting: Ophthalmology

## 2021-12-19 ENCOUNTER — Ambulatory Visit (INDEPENDENT_AMBULATORY_CARE_PROVIDER_SITE_OTHER): Payer: Medicare Other | Admitting: Ophthalmology

## 2021-12-19 ENCOUNTER — Other Ambulatory Visit: Payer: Self-pay

## 2021-12-19 DIAGNOSIS — H353134 Nonexudative age-related macular degeneration, bilateral, advanced atrophic with subfoveal involvement: Secondary | ICD-10-CM

## 2021-12-19 DIAGNOSIS — I10 Essential (primary) hypertension: Secondary | ICD-10-CM

## 2021-12-19 DIAGNOSIS — H35033 Hypertensive retinopathy, bilateral: Secondary | ICD-10-CM | POA: Diagnosis not present

## 2021-12-19 DIAGNOSIS — Z961 Presence of intraocular lens: Secondary | ICD-10-CM | POA: Diagnosis not present

## 2021-12-22 ENCOUNTER — Encounter (INDEPENDENT_AMBULATORY_CARE_PROVIDER_SITE_OTHER): Payer: Self-pay | Admitting: Ophthalmology

## 2021-12-25 ENCOUNTER — Other Ambulatory Visit: Payer: Self-pay

## 2021-12-25 ENCOUNTER — Ambulatory Visit (INDEPENDENT_AMBULATORY_CARE_PROVIDER_SITE_OTHER): Payer: Medicare Other

## 2021-12-25 ENCOUNTER — Ambulatory Visit (INDEPENDENT_AMBULATORY_CARE_PROVIDER_SITE_OTHER): Payer: Medicare Other | Admitting: Podiatry

## 2021-12-25 DIAGNOSIS — M778 Other enthesopathies, not elsewhere classified: Secondary | ICD-10-CM | POA: Diagnosis not present

## 2021-12-25 DIAGNOSIS — M2041 Other hammer toe(s) (acquired), right foot: Secondary | ICD-10-CM | POA: Diagnosis not present

## 2021-12-25 DIAGNOSIS — M2042 Other hammer toe(s) (acquired), left foot: Secondary | ICD-10-CM | POA: Diagnosis not present

## 2021-12-25 MED ORDER — TRIAMCINOLONE ACETONIDE 10 MG/ML IJ SUSP
10.0000 mg | Freq: Once | INTRAMUSCULAR | Status: AC
  Administered 2021-12-25: 10 mg

## 2021-12-25 NOTE — Progress Notes (Signed)
Subjective:  ? ?Patient ID: Yolanda Burke, female   DOB: 88 y.o.   MRN: [id number]  ? ?HPI ?Patient has developed discomfort in the second joint again and is concerned about digital deformities of the second and moderate on the third toe right foot and whether or not long-term that needs to be addressed ? ? ?ROS ? ? ?   ?Objective:  ?Physical Exam  ?Neurovascular status intact significant rigid contracture digit to right moderate 3 right with inflammation fluid of the second metatarsal phalangeal joint right foot ? ?   ?Assessment:  ?Inflammatory capsulitis of the second MPJ right with fluid buildup around the joint surface and digital deformity of the lesser digits right ? ?   ?Plan:  ?H&P reviewed conditions and discussed digital correction but do not recommend currently.  I went ahead today and did sterile prep and injected the MPJ 3 mg dexamethasone Kenalog 5 mg Xylocaine periarticular fashion advised on rigid bottom shoes and wide toe boxes to accommodate the digital deformity and reappoint as symptoms indicate ? ?X-rays indicate severe rigid contracture in both the transverse sagittal plane right with no indications fracture no indications of arthritic condition ?   ? ? ?

## 2022-01-13 ENCOUNTER — Encounter (HOSPITAL_BASED_OUTPATIENT_CLINIC_OR_DEPARTMENT_OTHER): Payer: Self-pay | Admitting: Emergency Medicine

## 2022-01-13 ENCOUNTER — Emergency Department (HOSPITAL_BASED_OUTPATIENT_CLINIC_OR_DEPARTMENT_OTHER): Payer: Medicare Other

## 2022-01-13 ENCOUNTER — Inpatient Hospital Stay (HOSPITAL_BASED_OUTPATIENT_CLINIC_OR_DEPARTMENT_OTHER)
Admission: EM | Admit: 2022-01-13 | Discharge: 2022-01-20 | DRG: 177 | Disposition: A | Discharge status: Home / Self Care | Payer: Medicare Other | Attending: Internal Medicine | Admitting: Internal Medicine

## 2022-01-13 ENCOUNTER — Emergency Department (HOSPITAL_BASED_OUTPATIENT_CLINIC_OR_DEPARTMENT_OTHER): Payer: Medicare Other | Admitting: Radiology

## 2022-01-13 ENCOUNTER — Other Ambulatory Visit: Payer: Self-pay

## 2022-01-13 DIAGNOSIS — J90 Pleural effusion, not elsewhere classified: Secondary | ICD-10-CM | POA: Diagnosis present

## 2022-01-13 DIAGNOSIS — Z91013 Allergy to seafood: Secondary | ICD-10-CM | POA: Diagnosis not present

## 2022-01-13 DIAGNOSIS — Z8249 Family history of ischemic heart disease and other diseases of the circulatory system: Secondary | ICD-10-CM | POA: Diagnosis not present

## 2022-01-13 DIAGNOSIS — E1165 Type 2 diabetes mellitus with hyperglycemia: Secondary | ICD-10-CM | POA: Diagnosis not present

## 2022-01-13 DIAGNOSIS — Z841 Family history of disorders of kidney and ureter: Secondary | ICD-10-CM | POA: Diagnosis not present

## 2022-01-13 DIAGNOSIS — T380X5A Adverse effect of glucocorticoids and synthetic analogues, initial encounter: Secondary | ICD-10-CM | POA: Diagnosis not present

## 2022-01-13 DIAGNOSIS — R739 Hyperglycemia, unspecified: Secondary | ICD-10-CM | POA: Diagnosis not present

## 2022-01-13 DIAGNOSIS — H9190 Unspecified hearing loss, unspecified ear: Secondary | ICD-10-CM | POA: Diagnosis present

## 2022-01-13 DIAGNOSIS — Z882 Allergy status to sulfonamides status: Secondary | ICD-10-CM

## 2022-01-13 DIAGNOSIS — Z20822 Contact with and (suspected) exposure to covid-19: Secondary | ICD-10-CM | POA: Diagnosis present

## 2022-01-13 DIAGNOSIS — Z66 Do not resuscitate: Secondary | ICD-10-CM | POA: Diagnosis present

## 2022-01-13 DIAGNOSIS — T50905A Adverse effect of unspecified drugs, medicaments and biological substances, initial encounter: Secondary | ICD-10-CM | POA: Diagnosis not present

## 2022-01-13 DIAGNOSIS — Z9841 Cataract extraction status, right eye: Secondary | ICD-10-CM | POA: Diagnosis not present

## 2022-01-13 DIAGNOSIS — Z87891 Personal history of nicotine dependence: Secondary | ICD-10-CM

## 2022-01-13 DIAGNOSIS — K219 Gastro-esophageal reflux disease without esophagitis: Secondary | ICD-10-CM | POA: Diagnosis present

## 2022-01-13 DIAGNOSIS — Y92239 Unspecified place in hospital as the place of occurrence of the external cause: Secondary | ICD-10-CM | POA: Diagnosis not present

## 2022-01-13 DIAGNOSIS — J869 Pyothorax without fistula: Principal | ICD-10-CM | POA: Diagnosis present

## 2022-01-13 DIAGNOSIS — J189 Pneumonia, unspecified organism: Secondary | ICD-10-CM

## 2022-01-13 DIAGNOSIS — Z8052 Family history of malignant neoplasm of bladder: Secondary | ICD-10-CM | POA: Diagnosis not present

## 2022-01-13 DIAGNOSIS — Z86006 Personal history of melanoma in-situ: Secondary | ICD-10-CM | POA: Diagnosis not present

## 2022-01-13 DIAGNOSIS — E871 Hypo-osmolality and hyponatremia: Secondary | ICD-10-CM | POA: Diagnosis not present

## 2022-01-13 DIAGNOSIS — H35039 Hypertensive retinopathy, unspecified eye: Secondary | ICD-10-CM | POA: Diagnosis present

## 2022-01-13 DIAGNOSIS — E7800 Pure hypercholesterolemia, unspecified: Secondary | ICD-10-CM

## 2022-01-13 DIAGNOSIS — H3530 Unspecified macular degeneration: Secondary | ICD-10-CM | POA: Diagnosis present

## 2022-01-13 DIAGNOSIS — I10 Essential (primary) hypertension: Secondary | ICD-10-CM | POA: Diagnosis present

## 2022-01-13 DIAGNOSIS — Z9842 Cataract extraction status, left eye: Secondary | ICD-10-CM | POA: Diagnosis not present

## 2022-01-13 LAB — CBC WITH DIFFERENTIAL/PLATELET
Abs Immature Granulocytes: 1.47 10*3/uL — ABNORMAL HIGH (ref 0.00–0.07)
Basophils Absolute: 0.1 10*3/uL (ref 0.0–0.1)
Basophils Relative: 1 %
Eosinophils Absolute: 0.1 10*3/uL (ref 0.0–0.5)
Eosinophils Relative: 1 %
HCT: 35.6 % — ABNORMAL LOW (ref 36.0–46.0)
Hemoglobin: 11.7 g/dL — ABNORMAL LOW (ref 12.0–15.0)
Immature Granulocytes: 11 %
Lymphocytes Relative: 10 %
Lymphs Abs: 1.5 10*3/uL (ref 0.7–4.0)
MCH: 28.1 pg (ref 26.0–34.0)
MCHC: 32.9 g/dL (ref 30.0–36.0)
MCV: 85.4 fL (ref 80.0–100.0)
Monocytes Absolute: 1.2 10*3/uL — ABNORMAL HIGH (ref 0.1–1.0)
Monocytes Relative: 8 %
Neutro Abs: 9.6 10*3/uL — ABNORMAL HIGH (ref 1.7–7.7)
Neutrophils Relative %: 69 %
Platelets: 365 10*3/uL (ref 150–400)
RBC: 4.17 MIL/uL (ref 3.87–5.11)
RDW: 13.6 % (ref 11.5–15.5)
WBC: 13.9 10*3/uL — ABNORMAL HIGH (ref 4.0–10.5)
nRBC: 0 % (ref 0.0–0.2)

## 2022-01-13 LAB — COMPREHENSIVE METABOLIC PANEL
ALT: 28 U/L (ref 0–44)
AST: 21 U/L (ref 15–41)
Albumin: 3.2 g/dL — ABNORMAL LOW (ref 3.5–5.0)
Alkaline Phosphatase: 65 U/L (ref 38–126)
Anion gap: 11 (ref 5–15)
BUN: 11 mg/dL (ref 8–23)
CO2: 23 mmol/L (ref 22–32)
Calcium: 9.3 mg/dL (ref 8.9–10.3)
Chloride: 104 mmol/L (ref 98–111)
Creatinine, Ser: 0.76 mg/dL (ref 0.44–1.00)
GFR, Estimated: >60 mL/min (ref >60)
Glucose, Bld: 145 mg/dL — ABNORMAL HIGH (ref 70–99)
Potassium: 4.2 mmol/L (ref 3.5–5.1)
Sodium: 138 mmol/L (ref 135–145)
Total Bilirubin: 0.6 mg/dL (ref 0.3–1.2)
Total Protein: 7 g/dL (ref 6.5–8.1)

## 2022-01-13 LAB — D-DIMER, QUANTITATIVE: D-Dimer, Quant: 5.51 ug/mL-FEU — ABNORMAL HIGH (ref 0.00–0.50)

## 2022-01-13 LAB — TROPONIN I (HIGH SENSITIVITY): Troponin I (High Sensitivity): 8 ng/L (ref <18)

## 2022-01-13 LAB — PROCALCITONIN: Procalcitonin: 0.32 ng/mL

## 2022-01-13 LAB — TSH: TSH: 1.427 u[IU]/mL (ref 0.350–4.500)

## 2022-01-13 LAB — RESP PANEL BY RT-PCR (FLU A&B, COVID) ARPGX2
Influenza A by PCR: NEGATIVE
Influenza B by PCR: NEGATIVE
SARS Coronavirus 2 by RT PCR: NEGATIVE

## 2022-01-13 LAB — BRAIN NATRIURETIC PEPTIDE: B Natriuretic Peptide: 159.1 pg/mL — ABNORMAL HIGH (ref 0.0–100.0)

## 2022-01-13 MED ORDER — IPRATROPIUM-ALBUTEROL 0.5-2.5 (3) MG/3ML IN SOLN: 3.0000 mL | Freq: Four times a day (QID) | RESPIRATORY_TRACT | Status: DC

## 2022-01-13 MED ORDER — IPRATROPIUM-ALBUTEROL 0.5-2.5 (3) MG/3ML IN SOLN
3.0000 mL | Freq: Three times a day (TID) | RESPIRATORY_TRACT | Status: DC
  Administered 2022-01-14 – 2022-01-15 (×6): 3 mL via RESPIRATORY_TRACT
  Filled 2022-01-13 (×6): qty 3

## 2022-01-13 MED ORDER — IPRATROPIUM-ALBUTEROL 0.5-2.5 (3) MG/3ML IN SOLN
3.0000 mL | Freq: Four times a day (QID) | RESPIRATORY_TRACT | Status: DC
  Administered 2022-01-13: 3 mL via RESPIRATORY_TRACT
  Filled 2022-01-13: qty 3

## 2022-01-13 MED ORDER — SODIUM CHLORIDE 0.9 % IV SOLN
500.0000 mg | INTRAVENOUS | Status: AC
  Administered 2022-01-14 – 2022-01-18 (×5): 500 mg via INTRAVENOUS
  Filled 2022-01-13 (×5): qty 5

## 2022-01-13 MED ORDER — METHYLPREDNISOLONE SODIUM SUCC 125 MG IJ SOLR
60.0000 mg | Freq: Two times a day (BID) | INTRAMUSCULAR | Status: DC
  Administered 2022-01-13 – 2022-01-15 (×4): 60 mg via INTRAVENOUS
  Filled 2022-01-13 (×4): qty 2

## 2022-01-13 MED ORDER — KETOROLAC TROMETHAMINE 15 MG/ML IJ SOLN
15.0000 mg | Freq: Once | INTRAMUSCULAR | Status: AC
  Administered 2022-01-13: 15 mg via INTRAVENOUS
  Filled 2022-01-13: qty 1

## 2022-01-13 MED ORDER — IOHEXOL 350 MG/ML SOLN
100.0000 mL | Freq: Once | INTRAVENOUS | Status: AC | PRN
  Administered 2022-01-13: 60 mL via INTRAVENOUS

## 2022-01-13 MED ORDER — SODIUM CHLORIDE 0.9 % IV SOLN
2.0000 g | INTRAVENOUS | Status: AC
  Administered 2022-01-14 – 2022-01-18 (×5): 2 g via INTRAVENOUS
  Filled 2022-01-13 (×5): qty 20

## 2022-01-13 MED ORDER — ALBUTEROL SULFATE (2.5 MG/3ML) 0.083% IN NEBU: 2.5000 mg | INHALATION_SOLUTION | RESPIRATORY_TRACT | Status: DC | PRN

## 2022-01-13 MED ORDER — DM-GUAIFENESIN ER 30-600 MG PO TB12
1.0000 | ORAL_TABLET | Freq: Two times a day (BID) | ORAL | Status: DC
  Administered 2022-01-13 – 2022-01-20 (×14): 1 via ORAL
  Filled 2022-01-13 (×14): qty 1

## 2022-01-13 MED ORDER — SODIUM CHLORIDE 0.9 % IV SOLN
500.0000 mg | Freq: Once | INTRAVENOUS | Status: AC
  Administered 2022-01-13: 500 mg via INTRAVENOUS
  Filled 2022-01-13: qty 5

## 2022-01-13 MED ORDER — SODIUM CHLORIDE 0.9 % IV SOLN
1.0000 g | Freq: Once | INTRAVENOUS | Status: AC
  Administered 2022-01-13: 1 g via INTRAVENOUS
  Filled 2022-01-13: qty 10

## 2022-01-13 MED ORDER — ENOXAPARIN SODIUM 40 MG/0.4ML IJ SOSY
40.0000 mg | PREFILLED_SYRINGE | INTRAMUSCULAR | Status: DC
  Administered 2022-01-13 – 2022-01-19 (×7): 40 mg via SUBCUTANEOUS
  Filled 2022-01-13 (×7): qty 0.4

## 2022-01-13 MED ORDER — LIDOCAINE 5 % EX PTCH
1.0000 | MEDICATED_PATCH | CUTANEOUS | Status: DC
  Administered 2022-01-13 – 2022-01-17 (×3): 1 via TRANSDERMAL
  Filled 2022-01-13 (×8): qty 1

## 2022-01-13 NOTE — H&P (Signed)
?[facility] ?History and Physical ? ?Yolanda Burke RWE:[id number] DOB: 03-23-1932 DOA: 01/13/2022 ? ?Referring physician:  ?PCP: Virgie Dad, MD  ? ?Chief Complaint: SOB ? ?HPI: Yolanda Burke is a 88 y.o. [place] PMHx chronic back pain, chronic cystitis with hematuria, diverticulitis, HTN, HLD, nephrolithiasis, cholelithiasis, macular degeneration, hypertensive retinopathy ? ?Presented to [facility] w/ dyspnea. W/u revealed elevated d-dimer. CTA chest was negative for clot, but did show multifocal PNA and a loculated pleural effusion. She was started on rocephin/zithro. COVID/flu pending ? ?Review of Systems:  ?Covid vaccination; ? ?Constitutional:  ?No weight loss, night sweats, Fevers, chills, fatigue.  ?HEENT:  ?No headaches, Difficulty swallowing,Tooth/dental problems,Sore throat,  ?No sneezing, itching, ear ache, nasal congestion, post nasal drip,  ?Cardio-vascular:  ?No chest pain, Orthopnea, PND, swelling in lower extremities, anasarca, dizziness, palpitations  ?GI:  ?No heartburn, indigestion, abdominal pain, nausea, vomiting, diarrhea, change in bowel habits, loss of appetite  ?Resp:  ?Positive shortness of breath with exertion or at rest. No excess mucus, Positive  productive cough, No non-productive cough, No coughing up of blood.No change in color of mucus.Positive wheezing.No chest wall deformity  ?Skin:  ?no rash or lesions.  ?GU:  ?no dysuria, change in color of urine, no urgency or frequency. No flank pain.  ?Musculoskeletal:  ?No joint pain or swelling. No decreased range of motion. No back pain.  ?Psych:  ?No change in mood or affect. No depression or anxiety. No memory loss.  ? ?Past Medical History:  ?Diagnosis Date  ? Allergy   ? Arthritis   ? Back pain   ? Chronic cystitis with hematuria   ? Colon polyps   ? Cystocele, unspecified (CODE)   ? Diverticulitis   ? Dyslipidemia   ? Gallstones   ? Gastric ulcer   ? Gastritis   ? GERD (gastroesophageal reflux disease)   ? Hearing loss   ?  Hemorrhoids   ? Hyperlipidemia   ? Hypertension   ? Hypertensive retinopathy   ? OU  ? Impaired fasting glucose   ? Macular degeneration   ? Dry OU  ? Melanoma in situ of neck ([place])   ? Renal insufficiency   ? ?Past Surgical History:  ?Procedure Laterality Date  ? APPENDECTOMY    ? CATARACT EXTRACTION Bilateral   ? Dr. Loni Dolly - [place], [place]  ? CHOLECYSTECTOMY    ? with appendectomy  ? EYE SURGERY Bilateral   ? Cat Sx - Dr. Loni Dolly - [place], [place]  ? THROAT SURGERY    ? calcified ligament  ? TONSILLECTOMY    ? ?Social History:  reports that she quit smoking about 36 years ago. Her smoking use included cigarettes. She has never used smokeless tobacco. She reports current alcohol use. She reports that she does not use drugs. ? ?Allergies  ?Allergen Reactions  ? Oysters [Shellfish Allergy] Swelling  ? Sulfa Antibiotics   ?  CANNOT REMEMBER REACTION  ? ? ?Family History  ?Problem Relation Age of Onset  ? Heart failure Mother   ? Dementia Mother   ? Pneumonia Father   ? Kidney failure Father   ? Heart disease Brother   ? Bladder Cancer Brother   ? ? ? ?Prior to Admission medications   ?Medication Sig Start Date End Date Taking? Authorizing Provider  ?Multiple Vitamins-Minerals (ICAPS AREDS 2 PO) Take 1 tablet by mouth 2 (two) times daily.    [provider]  ?pravastatin (PRAVACHOL) 20 MG tablet Take 1 tablet (20 mg total) by mouth daily. ?Patient  not taking: Reported on 12/19/2021 10/31/20   Gayland Curry, DO  ? ? ? ?Consultants:  ? ? ?Procedures/Significant Events:  ? ? ?I have personally reviewed and interpreted all radiology studies and my findings are as above. ? ? ?VENTILATOR SETTINGS: ? ? ? ?Cultures ?3/20 influenza A/B negative ?3/20 SARS coronavirus negative ? ? ?Antimicrobials: ?Anti-infectives (From admission, onward)  ? ? Start     Dose/Rate Route Frequency Ordered Stop  ? 01/14/22 1400  azithromycin (ZITHROMAX) 500 mg in sodium chloride 0.9 % 250 mL IVPB       ? 500 mg ?250 mL/hr over 60 Minutes  Intravenous Every 24 hours 01/13/22 1832 01/19/22 1359  ? 01/14/22 1200  cefTRIAXone (ROCEPHIN) 2 g in sodium chloride 0.9 % 100 mL IVPB       ? 2 g ?200 mL/hr over 30 Minutes Intravenous Every 24 hours 01/13/22 1832 01/19/22 1159  ? 01/13/22 1215  cefTRIAXone (ROCEPHIN) 1 g in sodium chloride 0.9 % 100 mL IVPB       ? 1 g ?200 mL/hr over 30 Minutes Intravenous  Once 01/13/22 1212 01/13/22 1348  ? 01/13/22 1215  azithromycin (ZITHROMAX) 500 mg in sodium chloride 0.9 % 250 mL IVPB       ? 500 mg ?250 mL/hr over 60 Minutes Intravenous  Once 01/13/22 1212 01/13/22 1527  ? ?  ?  ? ? ?Devices ?  ? ?LINES / TUBES:  ? ? ? ? ?Continuous Infusions: ? ?Physical Exam: ?Vitals:  ? 01/13/22 1242 01/13/22 1300 01/13/22 1328 01/13/22 1753  ?BP: (!) 163/72 (!) 159/67 (!) 153/60 (!) 153/86  ?Pulse: 83 80 84 88  ?Resp: (!) 22 17 (!) 21 18  ?Temp:    98.1 ?F (36.7 ?C)  ?TempSrc:    Oral  ?SpO2: 96% 95% 95% 99%  ?Weight:      ?Height:      ? ? ?Wt Readings from Last 3 Encounters:  ?01/13/22 61.2 kg  ?09/16/21 62.4 kg  ?06/19/21 61.7 kg  ? ? ?General: A/O x4, No acute respiratory distress ?Eyes: negative scleral hemorrhage, negative anisocoria, negative icterus ?ENT: Negative Runny nose, negative gingival bleeding, ?Neck:  Negative scars, masses, torticollis, lymphadenopathy, JVD ?Lungs: decreased breath sounds bilaterally without wheezes or crackles,Positive cough with deep inspiration ?Cardiovascular: Regular rate and rhythm without murmur gallop or rub normal S1 and S2 ?Abdomen: negative abdominal pain, nondistended, positive soft, bowel sounds, no rebound, no ascites, no appreciable mass ?Extremities: No significant cyanosis, clubbing, or edema bilateral lower extremities ?Skin: Negative rashes, lesions, ulcers, sunburn lesions (just returned from [place]) ?Psychiatric:  Negative depression, negative anxiety, negative fatigue, negative mania  ?Central nervous system:  Cranial nerves II through XII intact, tongue/uvula midline, all  extremities muscle strength 5/5, sensation intact throughout, finger nose finger bilateral within normal limits, quick finger touch bilateral within normal limits, negative Romberg sign, heel to shin bilateral within normal limits, standing on 1 foot bilateral within normal limits, walking on tiptoes within normal limits, walking on heels within normal limits, negative dysarthria, negative expressive aphasia, negative receptive aphasia. ?   ?   ? ?Labs on Admission:  ?Basic Metabolic Panel: ?Recent Labs  ?Lab 01/13/22 ?1033  ?NA 138  ?K 4.2  ?CL 104  ?CO2 23  ?GLUCOSE 145*  ?BUN 11  ?CREATININE 0.76  ?CALCIUM 9.3  ? ?Liver Function Tests: ?Recent Labs  ?Lab 01/13/22 ?1033  ?AST 21  ?ALT 28  ?ALKPHOS 65  ?BILITOT 0.6  ?PROT 7.0  ?ALBUMIN 3.2*  ? ?  No results for input(s): LIPASE, AMYLASE in the last 168 hours. ?No results for input(s): AMMONIA in the last 168 hours. ?CBC: ?Recent Labs  ?Lab 01/13/22 ?1019  ?WBC 13.9*  ?NEUTROABS 9.6*  ?HGB 11.7*  ?HCT 35.6*  ?MCV 85.4  ?PLT 365  ? ?Cardiac Enzymes: ?No results for input(s): CKTOTAL, CKMB, CKMBINDEX, TROPONINI in the last 168 hours. ? ?BNP (last 3 results) ?Recent Labs  ?  01/13/22 ?1111  ?BNP 159.1*  ? ? ?ProBNP (last 3 results) ?No results for input(s): PROBNP in the last 8760 hours. ? ?CBG: ?No results for input(s): GLUCAP in the last 168 hours. ? ?Radiological Exams on Admission: ?DG Chest 2 View ? ?Result Date: 01/13/2022 ?CLINICAL DATA:  Chest pain, shortness of breath EXAM: CHEST - 2 VIEW COMPARISON:  01/19/2018 FINDINGS: New infiltrate is seen in the right parahilar region and right lower lung fields. There is moderate right pleural effusion. Part of the effusion appears to be loculated along the lateral aspect of right mid lung fields. Possibility of empyema is not excluded. Small linear densities in the medial left lower lung fields may suggest subsegmental atelectasis. Left lung is otherwise unremarkable. There is minimal blunting of left lateral CP angle.  There is no pneumothorax. Degenerative changes are noted in the left shoulder. IMPRESSION: There is new infiltrate in the right mid and right lower lung fields suggesting atelectasis/pneumonia. Moderate right pleural e

## 2022-01-13 NOTE — ED Notes (Signed)
Patient transported to CT 

## 2022-01-13 NOTE — ED Triage Notes (Signed)
Traveled to [place] last week. States while there she suffered a heat stroke. C/o ongoing SOB, chest pain with inspiration.  ?

## 2022-01-13 NOTE — ED Provider Notes (Signed)
[facility] EMERGENCY DEPT Provider Note   CSN: [id number] Arrival date & time: 01/13/22  1005     History  Chief Complaint  Patient presents with   Shortness of Breath    Yolanda Burke is a 88 y.o. female.  This is a 88 y.o. f  with significant medical history as below, including diverticulitis, acid reflux, hyperlipidemia, hypertension who presents to the ED with complaint of chest pain.  Patient recently returned from [place].  While in [place] she reports that she suffered possible heat exhaustion, she was evaluated by physician.  Given IV rehydration, started on antibiotics for presumed pneumonia.  Patient had been having ongoing right-sided chest pain, pleuritic, exertional dyspnea, productive cough with clear/white sputum.  Sputum initially was greenish, yellowish but after completing azithromycin is white/clear.  She is still having exertional dyspnea.  Chest discomfort.  She has some chronic leg swelling which is unchanged from baseline.  No history of DVT or PE.  No hemoptysis.    Past Medical History: No date: Allergy No date: Arthritis No date: Back pain No date: Chronic cystitis with hematuria No date: Colon polyps No date: Cystocele, unspecified (CODE) No date: Diverticulitis No date: Dyslipidemia No date: Gallstones No date: Gastric ulcer No date: Gastritis No date: GERD (gastroesophageal reflux disease) No date: Hearing loss No date: Hemorrhoids No date: Hyperlipidemia No date: Hypertension No date: Hypertensive retinopathy     Comment:  OU No date: Impaired fasting glucose No date: Macular degeneration     Comment:  Dry OU No date: Melanoma in situ of neck (HCC) No date: Renal insufficiency  Past Surgical History: No date: APPENDECTOMY No date: CATARACT EXTRACTION; Bilateral     Comment:  Dr. Alferd Patee - [place], [place] No date: CHOLECYSTECTOMY     Comment:  with appendectomy No date: EYE SURGERY; Bilateral     Comment:  Cat Sx - Dr. Alferd Patee  - [place], [place] No date: THROAT SURGERY     Comment:  calcified ligament No date: TONSILLECTOMY    The history is provided by the patient and a relative. No language interpreter was used.  Shortness of Breath Associated symptoms: chest pain and cough   Associated symptoms: no abdominal pain, no fever, no headaches, no rash and no vomiting       Home Medications Prior to Admission medications   Medication Sig Start Date End Date Taking? Authorizing Provider  Multiple Vitamins-Minerals (ICAPS AREDS 2 PO) Take 1 tablet by mouth 2 (two) times daily.   Yes [provider]      Allergies    Oysters [shellfish allergy] and Sulfa antibiotics    Review of Systems   Review of Systems  Constitutional:  Positive for appetite change and fatigue. Negative for chills and fever.  HENT:  Negative for facial swelling and trouble swallowing.   Eyes:  Negative for photophobia and visual disturbance.  Respiratory:  Positive for cough and shortness of breath.   Cardiovascular:  Positive for chest pain and leg swelling. Negative for palpitations.  Gastrointestinal:  Negative for abdominal pain, blood in stool, nausea and vomiting.  Endocrine: Negative for polydipsia and polyuria.  Genitourinary:  Negative for difficulty urinating and hematuria.  Musculoskeletal:  Negative for back pain, gait problem and joint swelling.  Skin:  Negative for pallor and rash.  Neurological:  Negative for syncope and headaches.  Psychiatric/Behavioral:  Negative for agitation and confusion.    Physical Exam Updated Vital Signs BP (!) 147/74 (BP Location: Left Arm)   Pulse 76  Temp (!) 97.5 F (36.4 C) (Oral)   Resp 18   Ht 5\' 2"  (1.575 m)   Wt 61.2 kg   SpO2 95%   BMI 24.69 kg/m  Physical Exam Vitals and nursing note reviewed.  Constitutional:      General: She is not in acute distress.    Appearance: Normal appearance.  HENT:     Head: Normocephalic and atraumatic.     Right Ear: External ear  normal.     Left Ear: External ear normal.     Nose: Nose normal.     Mouth/Throat:     Mouth: Mucous membranes are moist.  Eyes:     General: No scleral icterus.       Right eye: No discharge.        Left eye: No discharge.  Cardiovascular:     Rate and Rhythm: Normal rate and regular rhythm.     Pulses: Normal pulses.     Heart sounds: Normal heart sounds.  Pulmonary:     Effort: Pulmonary effort is normal. No tachypnea, accessory muscle usage or respiratory distress.     Breath sounds: Decreased breath sounds present.     Comments: Adventitious breath sounds right greater than left Abdominal:     General: Abdomen is flat.     Tenderness: There is no abdominal tenderness.  Musculoskeletal:        General: Normal range of motion.     Cervical back: Full passive range of motion without pain and normal range of motion.     Right lower leg: No edema.     Left lower leg: No edema.  Skin:    General: Skin is warm and dry.     Capillary Refill: Capillary refill takes less than 2 seconds.  Neurological:     Mental Status: She is alert and oriented to person, place, and time.     GCS: GCS eye subscore is 4. GCS verbal subscore is 5. GCS motor subscore is 6.  Psychiatric:        Mood and Affect: Mood normal.        Behavior: Behavior normal.    ED Results / Procedures / Treatments   Labs (all labs ordered are listed, but only abnormal results are displayed) Labs Reviewed  COMPREHENSIVE METABOLIC PANEL - Abnormal; Notable for the following components:      Result Value   Glucose, Bld 145 (*)    Albumin 3.2 (*)    All other components within normal limits  D-DIMER, QUANTITATIVE - Abnormal; Notable for the following components:   D-Dimer, Quant 5.51 (*)    All other components within normal limits  CBC WITH DIFFERENTIAL/PLATELET - Abnormal; Notable for the following components:   WBC 13.9 (*)    Hemoglobin 11.7 (*)    HCT 35.6 (*)    Neutro Abs 9.6 (*)    Monocytes Absolute  1.2 (*)    Abs Immature Granulocytes 1.47 (*)    All other components within normal limits  BRAIN NATRIURETIC PEPTIDE - Abnormal; Notable for the following components:   B Natriuretic Peptide 159.1 (*)    All other components within normal limits  CBC WITH DIFFERENTIAL/PLATELET - Abnormal; Notable for the following components:   Hemoglobin 11.8 (*)    Neutro Abs 8.7 (*)    Lymphs Abs 0.5 (*)    Abs Immature Granulocytes 1.05 (*)    All other components within normal limits  COMPREHENSIVE METABOLIC PANEL - Abnormal; Notable for the following  components:   Sodium 132 (*)    CO2 21 (*)    Glucose, Bld 311 (*)    Calcium 8.4 (*)    Albumin 2.8 (*)    All other components within normal limits  EXPECTORATED SPUTUM ASSESSMENT W GRAM STAIN, RFLX TO RESP C  RESP PANEL BY RT-PCR (FLU A&B, COVID) ARPGX2  CULTURE, BLOOD (ROUTINE X 2)  CULTURE, BLOOD (ROUTINE X 2)  RESPIRATORY PANEL BY PCR  EXPECTORATED SPUTUM ASSESSMENT W GRAM STAIN, RFLX TO RESP C  TSH  PROCALCITONIN  MAGNESIUM  PHOSPHORUS  STREP PNEUMONIAE URINARY ANTIGEN  HIV ANTIBODY (ROUTINE TESTING W REFLEX)  CBC  CBC WITH DIFFERENTIAL/PLATELET  LEGIONELLA PNEUMOPHILA SEROGP 1 UR AG  TROPONIN I (HIGH SENSITIVITY)    EKG EKG Interpretation  Date/Time:  Monday January 13 2022 10:25:47 EDT Ventricular Rate:  90 PR Interval:  196 QRS Duration: 82 QT Interval:  345 QTC Calculation: 423 R Axis:   4 Text Interpretation: Sinus rhythm Low voltage, precordial leads Confirmed by Tanda Rockers ([id number]) on 01/14/2022 10:27:19 AM  Radiology DG Chest 2 View  Result Date: 01/13/2022 CLINICAL DATA:  Chest pain, shortness of breath EXAM: CHEST - 2 VIEW COMPARISON:  01/19/2018 FINDINGS: New infiltrate is seen in the right parahilar region and right lower lung fields. There is moderate right pleural effusion. Part of the effusion appears to be loculated along the lateral aspect of right mid lung fields. Possibility of empyema is not excluded.  Small linear densities in the medial left lower lung fields may suggest subsegmental atelectasis. Left lung is otherwise unremarkable. There is minimal blunting of left lateral CP angle. There is no pneumothorax. Degenerative changes are noted in the left shoulder. IMPRESSION: There is new infiltrate in the right mid and right lower lung fields suggesting atelectasis/pneumonia. Moderate right pleural effusion part of which appears to be loculated. Follow-up studies until complete clearing occurs should be considered to rule out any underlying neoplastic process. Electronically Signed   By: Ernie Avena M.D.   On: 01/13/2022 10:50   CT Angio Chest PE W and/or Wo Contrast  Result Date: 01/13/2022 CLINICAL DATA:  Recent travel history. Chest pain and shortness of breath. Positive D-dimer. EXAM: CT ANGIOGRAPHY CHEST WITH CONTRAST TECHNIQUE: Multidetector CT imaging of the chest was performed using the standard protocol during bolus administration of intravenous contrast. Multiplanar CT image reconstructions and MIPs were obtained to evaluate the vascular anatomy. RADIATION DOSE REDUCTION: This exam was performed according to the departmental dose-optimization program which includes automated exposure control, adjustment of the mA and/or kV according to patient size and/or use of iterative reconstruction technique. CONTRAST:  60mL OMNIPAQUE IOHEXOL 350 MG/ML SOLN COMPARISON:  None. FINDINGS: Cardiovascular: Satisfactory opacification of pulmonary arteries noted, and no pulmonary emboli identified. No evidence of thoracic aortic dissection or aneurysm. Aortic and coronary atherosclerotic calcification noted. Mediastinum/Nodes: No masses or pathologically enlarged lymph nodes identified. Lungs/Pleura: A moderate multiloculated right pleural effusion is seen. Atelectasis or infiltrate is seen in the right middle and lower lobes. Central tracheobronchial airways are patent. Upper abdomen: No acute findings.  Musculoskeletal: No suspicious bone lesions identified. Review of the MIP images confirms the above findings. IMPRESSION: No evidence of pulmonary embolism. Moderate multiloculated right pleural effusion. Right middle and lower lobe atelectasis versus infiltrate. No evidence of centrally obstructing mass or lymphadenopathy. Aortic Atherosclerosis (ICD10-I70.0). Electronically Signed   By: Danae Orleans M.D.   On: 01/13/2022 12:44   US Venous Img Lower Bilateral  Result Date: 01/13/2022 CLINICAL DATA:  Elevated D-dimer.  Cough. EXAM: BILATERAL LOWER EXTREMITY VENOUS DOPPLER ULTRASOUND TECHNIQUE: Jakye Mullens-scale sonography with graded compression, as well as color Doppler and duplex ultrasound were performed to evaluate the lower extremity deep venous systems from the level of the common femoral vein and including the common femoral, femoral, profunda femoral, popliteal and calf veins including the posterior tibial, peroneal and gastrocnemius veins when visible. The superficial great saphenous vein was also interrogated. Spectral Doppler was utilized to evaluate flow at rest and with distal augmentation maneuvers in the common femoral, femoral and popliteal veins. COMPARISON:  None. FINDINGS: RIGHT LOWER EXTREMITY Common Femoral Vein: No evidence of thrombus. Normal compressibility, respiratory phasicity and response to augmentation. Saphenofemoral Junction: No evidence of thrombus. Normal compressibility and flow on color Doppler imaging. Profunda Femoral Vein: No evidence of thrombus. Normal compressibility and flow on color Doppler imaging. Femoral Vein: No evidence of thrombus. Normal compressibility, respiratory phasicity and response to augmentation. Popliteal Vein: No evidence of thrombus. Normal compressibility, respiratory phasicity and response to augmentation. Calf Veins: Visualized right deep calf veins are patent without thrombus. Other Findings:  None. LEFT LOWER EXTREMITY Common Femoral Vein: No evidence  of thrombus. Normal compressibility, respiratory phasicity and response to augmentation. Saphenofemoral Junction: No evidence of thrombus. Normal compressibility and flow on color Doppler imaging. Profunda Femoral Vein: No evidence of thrombus. Normal compressibility and flow on color Doppler imaging. Femoral Vein: No evidence of thrombus. Normal compressibility, respiratory phasicity and response to augmentation. Popliteal Vein: No evidence of thrombus. Normal compressibility, respiratory phasicity and response to augmentation. Calf Veins: Visualized left deep calf veins are patent without thrombus. Other Findings:  None. IMPRESSION: No evidence of deep venous thrombosis in either lower extremity. Electronically Signed   By: Richarda Overlie M.D.   On: 01/13/2022 15:01    Procedures Procedures    Medications Ordered in ED Medications  lidocaine (LIDODERM) 5 % 1 patch (1 patch Transdermal Patch Removed 01/13/22 2327)  enoxaparin (LOVENOX) injection 40 mg (40 mg Subcutaneous Given 01/13/22 2316)  cefTRIAXone (ROCEPHIN) 2 g in sodium chloride 0.9 % 100 mL IVPB (has no administration in time range)  azithromycin (ZITHROMAX) 500 mg in sodium chloride 0.9 % 250 mL IVPB (has no administration in time range)  methylPREDNISolone sodium succinate (SOLU-MEDROL) 125 mg/2 mL injection 60 mg (60 mg Intravenous Given 01/14/22 0716)  dextromethorphan-guaiFENesin (MUCINEX DM) 30-600 MG per 12 hr tablet 1 tablet (1 tablet Oral Given 01/13/22 2046)  ipratropium-albuterol (DUONEB) 0.5-2.5 (3) MG/3ML nebulizer solution 3 mL (3 mLs Nebulization Given 01/14/22 0727)  ketorolac (TORADOL) 15 MG/ML injection 15 mg (15 mg Intravenous Given 01/13/22 1138)  cefTRIAXone (ROCEPHIN) 1 g in sodium chloride 0.9 % 100 mL IVPB (0 g Intravenous Stopped 01/13/22 1348)  azithromycin (ZITHROMAX) 500 mg in sodium chloride 0.9 % 250 mL IVPB (0 mg Intravenous Stopped 01/13/22 1527)  iohexol (OMNIPAQUE) 350 MG/ML injection 100 mL (60 mLs Intravenous  Contrast Given 01/13/22 1218)    ED Course/ Medical Decision Making/ A&P Clinical Course as of 01/14/22 1029  Mon Jan 13, 2022  1341 PORT/PSI score is 88 [SG]    Clinical Course User Index [SG] Sloan Leiter, DO                           Medical Decision Making Amount and/or Complexity of Data Reviewed Labs: ordered. Radiology: ordered.  Risk Prescription drug management. Decision regarding hospitalization.   Initial Impression and Ddx This patient presents to the Emergency Department  for the above complaint. This involves an extensive number of treatment options and is a complaint that carries with it a high risk of complications and morbidity. Vital signs were reviewed.   Serious etiologies considered. Ddx includes but is not limited to: Fracture, infection, infarction, embolism, MSK  Patient PMH that increases complexity of ED encounter: n/a  Social determinants of health include - n/a   Previous records obtained and reviewed   Additional history obtained from son   Interpretation of Diagnostics Labs & imaging results that were available during my care of the patient were visualized by me and considered in my medical decision making.   I ordered imaging studies which included CXR and I visualized the imaging and I agree with radiologist interpretation.  Likely right-sided pneumonia with presumed loculated pleural effusion.  Cardiac monitoring reviewed and interpreted personally which shows NSR     Patient Reassessment and Ultimate Disposition/Management  Patient management required discussion with the following services or consulting groups:  Hospitalist Service   Clinical Course as of 01/14/22 1029  Mon Jan 13, 2022  1341 PORT/PSI score is 88 [SG]    Clinical Course User Index [SG] Sloan Leiter, DO        Patient found to have right-sided pneumonia left-sided likely loculated pleural effusion.  Patient was recently treated for pneumonia with  antibiotics.  Ongoing symptoms.  She has low-grade fever in the emergency department.  Leukocytosis.  Hemodynamically stable.  Obtain sputum culture. Procal, legionella given travel  Start broad-spectrum antibiotics.   D-dimer is elevated, CT PE did not show evidence of acute PE.  Given failure of outpatient antibiotics, elevated port score of 88, would recommend admission for pneumonia.  Further evaluation of loculated left-sided pleural effusion. Pos PCCM eval.     Pt / family agreeable. D/w Dr Ronaldo Miyamoto who accepts pt for admission. Dr Ronaldo Miyamoto requests LE duplex to r/o DVT given elevated dimer. This has been ordered, he will f/u                Complexity of Problems Addressed Acute illness or injury that poses threat of life of bodily function  Additional Data Reviewed and Analyzed Further history obtained from: Further history from spouse/family member, Past medical history and medications listed in the EMR, and Prior labs/imaging results  Patient Encounter Risk Assessment Prescriptions and Consideration of hospitalization      This chart was dictated using voice recognition software.  Despite best efforts to proofread,  errors can occur which can change the documentation meaning.         Final Clinical Impression(s) / ED Diagnoses Final diagnoses:  Community acquired pneumonia, unspecified laterality  Pleural effusion on left    Rx / DC Orders ED Discharge Orders     None         Sloan Leiter, DO 01/14/22 1029

## 2022-01-13 NOTE — ED Notes (Signed)
Report attempted to be called, no answer.  ?

## 2022-01-13 NOTE — Progress Notes (Signed)
Plan of Care Note for accepted transfer ? ? ?Patient: Yolanda Burke MRN: [id number]   DOA: 01/13/2022 ? ?Facility requesting transfer: [facility] ?Requesting Provider: Dr. Pearline Cables ?Reason for transfer: Multifocal PNA ?Facility course: 88 yo F presenting wit [facility] w/ dyspnea. W/u revealed elevated d-dimer. CTA chest was negative for clot, but did show multifocal PNA and a loculated pleural effusion. She was started on rocephin/zithro. COVID/flu pending.  ? ?Plan of care: ?The patient is accepted for admission to Telemetry unit, at [facility]..  ?While patient is holding at [facility], medical decision making remains the responsibility of the medical staff there. Upon arrival to [facility], [facility] will take over. Thank you.  ? ?Author: ?Jonnie Finner, DO ?01/13/2022 ? ?Check [email or web address] for on-call coverage. ? ?Nursing staff, Please call [place] number on Amion as soon as patient's arrival, so appropriate admitting provider can evaluate the pt. ? ?

## 2022-01-14 DIAGNOSIS — J189 Pneumonia, unspecified organism: Secondary | ICD-10-CM | POA: Diagnosis not present

## 2022-01-14 DIAGNOSIS — T50905A Adverse effect of unspecified drugs, medicaments and biological substances, initial encounter: Secondary | ICD-10-CM

## 2022-01-14 DIAGNOSIS — R739 Hyperglycemia, unspecified: Secondary | ICD-10-CM

## 2022-01-14 LAB — COMPREHENSIVE METABOLIC PANEL
ALT: 28 U/L (ref 0–44)
AST: 21 U/L (ref 15–41)
Albumin: 2.8 g/dL — ABNORMAL LOW (ref 3.5–5.0)
Alkaline Phosphatase: 66 U/L (ref 38–126)
Anion gap: 10 (ref 5–15)
BUN: 13 mg/dL (ref 8–23)
CO2: 21 mmol/L — ABNORMAL LOW (ref 22–32)
Calcium: 8.4 mg/dL — ABNORMAL LOW (ref 8.9–10.3)
Chloride: 101 mmol/L (ref 98–111)
Creatinine, Ser: 0.77 mg/dL (ref 0.44–1.00)
GFR, Estimated: >60 mL/min (ref >60)
Glucose, Bld: 311 mg/dL — ABNORMAL HIGH (ref 70–99)
Potassium: 3.9 mmol/L (ref 3.5–5.1)
Sodium: 132 mmol/L — ABNORMAL LOW (ref 135–145)
Total Bilirubin: 0.4 mg/dL (ref 0.3–1.2)
Total Protein: 6.8 g/dL (ref 6.5–8.1)

## 2022-01-14 LAB — CBC WITH DIFFERENTIAL/PLATELET
Abs Immature Granulocytes: 1.05 10*3/uL — ABNORMAL HIGH (ref 0.00–0.07)
Basophils Absolute: 0.1 10*3/uL (ref 0.0–0.1)
Basophils Relative: 1 %
Eosinophils Absolute: 0 10*3/uL (ref 0.0–0.5)
Eosinophils Relative: 0 %
HCT: 36.7 % (ref 36.0–46.0)
Hemoglobin: 11.8 g/dL — ABNORMAL LOW (ref 12.0–15.0)
Immature Granulocytes: 10 %
Lymphocytes Relative: 5 %
Lymphs Abs: 0.5 10*3/uL — ABNORMAL LOW (ref 0.7–4.0)
MCH: 28.4 pg (ref 26.0–34.0)
MCHC: 32.2 g/dL (ref 30.0–36.0)
MCV: 88.4 fL (ref 80.0–100.0)
Monocytes Absolute: 0.2 10*3/uL (ref 0.1–1.0)
Monocytes Relative: 2 %
Neutro Abs: 8.7 10*3/uL — ABNORMAL HIGH (ref 1.7–7.7)
Neutrophils Relative %: 82 %
Platelets: 316 10*3/uL (ref 150–400)
RBC: 4.15 MIL/uL (ref 3.87–5.11)
RDW: 13.2 % (ref 11.5–15.5)
WBC: 10.5 10*3/uL (ref 4.0–10.5)
nRBC: 0 % (ref 0.0–0.2)

## 2022-01-14 LAB — HEMOGLOBIN A1C
Hgb A1c MFr Bld: 6.7 % — ABNORMAL HIGH (ref 4.8–5.6)
Mean Plasma Glucose: 145.59 mg/dL

## 2022-01-14 LAB — RESPIRATORY PANEL BY PCR

## 2022-01-14 LAB — HIV ANTIBODY (ROUTINE TESTING W REFLEX): HIV Screen 4th Generation wRfx: NONREACTIVE

## 2022-01-14 LAB — MAGNESIUM: Magnesium: 2 mg/dL (ref 1.7–2.4)

## 2022-01-14 LAB — PHOSPHORUS: Phosphorus: 3.3 mg/dL (ref 2.5–4.6)

## 2022-01-14 LAB — STREP PNEUMONIAE URINARY ANTIGEN: Strep Pneumo Urinary Antigen: NEGATIVE

## 2022-01-14 MED ORDER — INSULIN ASPART 100 UNIT/ML IJ SOLN
0.0000 [IU] | INTRAMUSCULAR | Status: DC
  Administered 2022-01-14 (×2): 8 [IU] via SUBCUTANEOUS
  Administered 2022-01-15: 5 [IU] via SUBCUTANEOUS
  Administered 2022-01-15: 2 [IU] via SUBCUTANEOUS
  Administered 2022-01-15: 8 [IU] via SUBCUTANEOUS
  Administered 2022-01-15 – 2022-01-16 (×4): 3 [IU] via SUBCUTANEOUS
  Administered 2022-01-16: 2 [IU] via SUBCUTANEOUS
  Administered 2022-01-16: 3 [IU] via SUBCUTANEOUS

## 2022-01-14 NOTE — Progress Notes (Signed)
Inpatient Diabetes Program Recommendations ? ?AACE/ADA: New Consensus Statement on Inpatient Glycemic Control  ? ?Target Ranges:  Prepandial:   less than 140 mg/dL ?     Peak postprandial:   less than 180 mg/dL (1-2 hours) ?     Critically ill patients:  140 - 180 mg/dL  ? ? Latest Reference Range & Units 01/13/22 10:33 01/14/22 04:46  ?Glucose 70 - 99 mg/dL 145 (H) 311 (H)  ? ?Review of Glycemic Control ? ?Diabetes history: No ?Outpatient Diabetes medications: NA ?Current orders for Inpatient glycemic control: None; Solumedrol 60 mg Q12H ? ?Inpatient Diabetes Program Recommendations:   ? ?Insulin: If steroids are continued, please consider ordering CBGs AC&HS with Novolog 0-9 units AC&HS. ? ?Thanks, ?Barnie Alderman, RN, MSN, CDE ?Diabetes Coordinator ?Inpatient Diabetes Program ?[phone] (Team Pager from 8am to 5pm) ? ? ? ? ?

## 2022-01-14 NOTE — Progress Notes (Signed)
Pt goes to the Bathroom disconnected from pulse ox and desats with activity. O2 sat on room air upon returning to bed is 85%. Placed pulse ox probe on R hand as it started to have trouble picking up pulse in fingers, both hands are cold. Pt was placed on O2 at 2lnc  ?

## 2022-01-14 NOTE — Progress Notes (Signed)
?PROGRESS NOTE ? ? ? ?Yolanda Burke  ULA:[id number] DOB: [birth year] DOA: 01/13/2022 ?PCP: Yolanda Dad, MD  ? ? ? ?Brief Narrative:  ?Yolanda Burke is a 88 y.o. WF PMHx chronic back pain, chronic cystitis with hematuria, diverticulitis, HTN, HLD, nephrolithiasis, cholelithiasis, macular degeneration, hypertensive retinopathy ?  ?Presented to [facility] w/ dyspnea. W/u revealed elevated d-dimer. CTA chest was negative for clot, but did show multifocal PNA and a loculated pleural effusion. She was started on rocephin/zithro. COVID/flu pending ? ? ?Subjective: ?3/21 A/O x4 states still feels SOB but feels there has been some improvement over yesterday. ? ? ?Assessment & Plan: ?Covid vaccination; vaccinated 4/4 ?  ?Principal Problem: ?  Multifocal pneumonia ?Active Problems: ?  Pure hypercholesterolemia ?  Hyperglycemia, drug-induced ? ?Multifocal pneumonia ?-Continue 5-day course antibiotics ?- Solu-Medrol 60 mg BID ?- DuoNeb QID ?- Mucinex DM BID ?- Flutter valve ?- Incentive spirometry ?- Continuous pulse ox ?- Titrate O2 to maintain SPO2> 92% ? ?Hyperglycemia ?- Iatrogenic secondary to steroids ?- 3/21 moderate SSI ?  ? ? ?  ? ? ?Mobility Assessment (last 72 hours)   ? ? Mobility Assessment   ? ? [place] Name 01/13/22 2043  ?  ?  ?  ?  ? Does patient have an order for bedrest or is patient medically unstable No - Continue assessment      ? What is the highest level of mobility based on the progressive mobility assessment? Level 4 (Walks with assist in room) - Balance while marching in place and cannot step forward and back - Complete      ? ?  ?  ? ?  ? ? ?DVT prophylaxis: Lovenox ?Code Status: Full ?Family Communication:  ?Status is: Inpatient ? ? ? ?Dispo: The patient is from: Home ?             Anticipated d/c is to: Home ?             Anticipated d/c date is: > 3 days ?             Patient currently is not medically stable to d/c. ? ? ? ? ? ?Consultants:  ? ? ?Procedures/Significant Events:  ? ? ?I have personally  reviewed and interpreted all radiology studies and my findings are as above. ? ?VENTILATOR SETTINGS: ?Room air 3/21 ?SPO2 94% ? ? ?Cultures ?3/20 influenza A/B negative ?3/20 SARS coronavirus negative ? ? ?Antimicrobials: ?Anti-infectives (From admission, onward)  ? ? Start     Ordered Stop  ? 01/14/22 1400  azithromycin (ZITHROMAX) 500 mg in sodium chloride 0.9 % 250 mL IVPB       ? 01/13/22 1832 01/19/22 1359  ? 01/14/22 1200  cefTRIAXone (ROCEPHIN) 2 g in sodium chloride 0.9 % 100 mL IVPB       ? 01/13/22 1832 01/19/22 1159  ? 01/13/22 1215  cefTRIAXone (ROCEPHIN) 1 g in sodium chloride 0.9 % 100 mL IVPB       ? 01/13/22 1212 01/13/22 1348  ? 01/13/22 1215  azithromycin (ZITHROMAX) 500 mg in sodium chloride 0.9 % 250 mL IVPB       ? 01/13/22 1212 01/13/22 1527  ? ?  ?  ? ? ?Devices ?  ? ?LINES / TUBES:  ? ? ? ? ?Continuous Infusions: ? azithromycin    ? cefTRIAXone (ROCEPHIN)  IV    ? ? ? ?Objective: ?Vitals:  ? 01/13/22 2151 01/14/22 0249 01/14/22 0547 01/14/22 0729  ?BP: 135/78 (!) 154/71 (!) 147/74   ?  Pulse: 86 76 76   ?Resp: 16 (!) 22 18   ?Temp: 97.8 ?F (36.6 ?C) 98 ?F (36.7 ?C) (!) 97.5 ?F (36.4 ?C)   ?TempSrc: Oral Oral Oral   ?SpO2: 97% 97% 96% 95%  ?Weight:      ?Height:      ? ? ?Intake/Output Summary (Last 24 hours) at 01/14/2022 1305 ?Last data filed at 01/14/2022 1829 ?Gross per 24 hour  ?Intake 1079.31 ml  ?Output --  ?Net 1079.31 ml  ? ?Filed Weights  ? 01/13/22 1012  ?Weight: 61.2 kg  ? ? ?Examination: ? ?General: A/O x4, No acute respiratory distress ?Eyes: negative scleral hemorrhage, negative anisocoria, negative icterus ?ENT: Negative Runny nose, negative gingival bleeding, ?Neck:  Negative scars, masses, torticollis, lymphadenopathy, JVD ?Lungs: decreased air movement bilaterally, positive mild expiratory wheezes  ?Cardiovascular: Regular rate and rhythm without murmur gallop or rub normal S1 and S2 ?Abdomen: negative abdominal pain, nondistended, positive soft, bowel sounds, no rebound, no  ascites, no appreciable mass ?Extremities: No significant cyanosis, clubbing, or edema bilateral lower extremities ?Skin: Negative rashes, lesions, ulcers ?Psychiatric:  Negative depression, negative anxiety, negative fatigue, negative mania  ?Central nervous system:  Cranial nerves II through XII intact, tongue/uvula midline, all extremities muscle strength 5/5, sensation intact throughout, negative dysarthria, negative expressive aphasia, negative receptive aphasia. ? ?.  ? ? ? ?Data Reviewed: Care during the described time interval was provided by me .  I have reviewed this patient's available data, including medical history, events of note, physical examination, and all test results as part of my evaluation. ? ?CBC: ?Recent Labs  ?Lab 01/13/22 ?1019 01/14/22 ?0446  ?WBC 13.9* 10.5  ?NEUTROABS 9.6* 8.7*  ?HGB 11.7* 11.8*  ?HCT 35.6* 36.7  ?MCV 85.4 88.4  ?PLT 365 316  ? ?Basic Metabolic Panel: ?Recent Labs  ?Lab 01/13/22 ?1033 01/14/22 ?0446  ?NA 138 132*  ?K 4.2 3.9  ?CL 104 101  ?CO2 23 21*  ?GLUCOSE 145* 311*  ?BUN 11 13  ?CREATININE 0.76 0.77  ?CALCIUM 9.3 8.4*  ?MG  --  2.0  ?PHOS  --  3.3  ? ?GFR: ?Estimated Creatinine Clearance: 41.8 mL/min (by C-G formula based on SCr of 0.77 mg/dL). ?Liver Function Tests: ?Recent Labs  ?Lab 01/13/22 ?1033 01/14/22 ?0446  ?AST 21 21  ?ALT 28 28  ?ALKPHOS 65 66  ?BILITOT 0.6 0.4  ?PROT 7.0 6.8  ?ALBUMIN 3.2* 2.8*  ? ?No results for input(s): LIPASE, AMYLASE in the last 168 hours. ?No results for input(s): AMMONIA in the last 168 hours. ?Coagulation Profile: ?No results for input(s): INR, PROTIME in the last 168 hours. ?Cardiac Enzymes: ?No results for input(s): CKTOTAL, CKMB, CKMBINDEX, TROPONINI in the last 168 hours. ?BNP (last 3 results) ?No results for input(s): PROBNP in the last 8760 hours. ?HbA1C: ?No results for input(s): HGBA1C in the last 72 hours. ?CBG: ?No results for input(s): GLUCAP in the last 168 hours. ?Lipid Profile: ?No results for input(s): CHOL, HDL,  LDLCALC, TRIG, CHOLHDL, LDLDIRECT in the last 72 hours. ?Thyroid Function Tests: ?Recent Labs  ?  01/13/22 ?1053  ?TSH 1.427  ? ?Anemia Panel: ?No results for input(s): VITAMINB12, FOLATE, FERRITIN, TIBC, IRON, RETICCTPCT in the last 72 hours. ?Sepsis Labs: ?Recent Labs  ?Lab 01/13/22 ?1033  ?PROCALCITON 0.32  ? ? ?Recent Results (from the past 240 hour(s))  ?Expectorated Sputum Assessment w Gram Stain, Rflx to Resp Cult     Status: None (Preliminary result)  ? Collection Time: 01/13/22 12:13 PM  ? Specimen:  Sputum  ?Result Value Ref Range Status  ? Specimen Description   Final  ?  SPUTUM ?Performed at [facility], [address] [place], [place] [address] ?  ? Special Requests   Final  ?  NONE ?Performed at [facility], [address] [place], [place] [address] ?  ? Sputum evaluation   Final  ?  MODERATE WBC PRESENT,BOTH PMN AND MONONUCLEAR ?FEW GRAM POSITIVE COCCI IN CHAINS ?FEW GRAM NEGATIVE COCCI ?FEW GRAM VARIABLE ROD ?Performed at [facility], [place] [address], [place], [place] [address] ?  ? Report Status PENDING  Incomplete  ?Resp Panel by RT-PCR (Flu A&B, Covid) Expectorated Sputum     Status: None  ? Collection Time: 01/13/22  1:56 PM  ? Specimen: Expectorated Sputum; Nasopharyngeal(NP) swabs in vial transport medium  ?Result Value Ref Range Status  ? SARS Coronavirus 2 by RT PCR NEGATIVE NEGATIVE Final  ?  Comment: (NOTE) ?SARS-CoV-2 target nucleic acids are NOT DETECTED. ? ?The SARS-CoV-2 RNA is generally detectable in upper respiratory ?specimens during the acute phase of infection. The lowest ?concentration of SARS-CoV-2 viral copies this assay can detect is ?138 copies/mL. A negative result does not preclude SARS-Cov-2 ?infection and should not be used as the sole basis for treatment or ?other patient management decisions. A negative result may occur with  ?improper specimen collection/handling, submission of specimen other ?than nasopharyngeal swab,  presence of viral mutation(s) within the ?areas targeted by this assay, and inadequate number of viral ?copies(<138 copies/mL). A negative result must be combined with ?clinical observations, patient history

## 2022-01-15 DIAGNOSIS — J189 Pneumonia, unspecified organism: Secondary | ICD-10-CM | POA: Diagnosis not present

## 2022-01-15 LAB — GLUCOSE, CAPILLARY
Glucose-Capillary: 154 mg/dL — ABNORMAL HIGH (ref 70–99)
Glucose-Capillary: 157 mg/dL — ABNORMAL HIGH (ref 70–99)
Glucose-Capillary: 273 mg/dL — ABNORMAL HIGH (ref 70–99)
Glucose-Capillary: 279 mg/dL — ABNORMAL HIGH (ref 70–99)
Glucose-Capillary: 280 mg/dL — ABNORMAL HIGH (ref 70–99)
Glucose-Capillary: 196 mg/dL — ABNORMAL HIGH (ref 70–99)
Glucose-Capillary: 215 mg/dL — ABNORMAL HIGH (ref 70–99)

## 2022-01-15 LAB — COMPREHENSIVE METABOLIC PANEL
ALT: 30 U/L (ref 0–44)
AST: 25 U/L (ref 15–41)
Albumin: 2.6 g/dL — ABNORMAL LOW (ref 3.5–5.0)
Alkaline Phosphatase: 58 U/L (ref 38–126)
Anion gap: 9 (ref 5–15)
BUN: 17 mg/dL (ref 8–23)
CO2: 22 mmol/L (ref 22–32)
Calcium: 8.8 mg/dL — ABNORMAL LOW (ref 8.9–10.3)
Chloride: 104 mmol/L (ref 98–111)
Creatinine, Ser: 0.71 mg/dL (ref 0.44–1.00)
GFR, Estimated: >60 mL/min (ref >60)
Glucose, Bld: 173 mg/dL — ABNORMAL HIGH (ref 70–99)
Potassium: 4.1 mmol/L (ref 3.5–5.1)
Sodium: 135 mmol/L (ref 135–145)
Total Bilirubin: 0.1 mg/dL — ABNORMAL LOW (ref 0.3–1.2)
Total Protein: 6.4 g/dL — ABNORMAL LOW (ref 6.5–8.1)

## 2022-01-15 LAB — CBC WITH DIFFERENTIAL/PLATELET
Abs Immature Granulocytes: 0.99 10*3/uL — ABNORMAL HIGH (ref 0.00–0.07)
Basophils Absolute: 0.1 10*3/uL (ref 0.0–0.1)
Basophils Relative: 0 %
Eosinophils Absolute: 0 10*3/uL (ref 0.0–0.5)
Eosinophils Relative: 0 %
HCT: 32.6 % — ABNORMAL LOW (ref 36.0–46.0)
Hemoglobin: 10.8 g/dL — ABNORMAL LOW (ref 12.0–15.0)
Immature Granulocytes: 5 %
Lymphocytes Relative: 4 %
Lymphs Abs: 0.8 10*3/uL (ref 0.7–4.0)
MCH: 28.6 pg (ref 26.0–34.0)
MCHC: 33.1 g/dL (ref 30.0–36.0)
MCV: 86.5 fL (ref 80.0–100.0)
Monocytes Absolute: 0.7 10*3/uL (ref 0.1–1.0)
Monocytes Relative: 4 %
Neutro Abs: 18.3 10*3/uL — ABNORMAL HIGH (ref 1.7–7.7)
Neutrophils Relative %: 87 %
Platelets: 363 10*3/uL (ref 150–400)
RBC: 3.77 MIL/uL — ABNORMAL LOW (ref 3.87–5.11)
RDW: 13.2 % (ref 11.5–15.5)
WBC: 20.9 10*3/uL — ABNORMAL HIGH (ref 4.0–10.5)
nRBC: 0 % (ref 0.0–0.2)

## 2022-01-15 LAB — MAGNESIUM: Magnesium: 2 mg/dL (ref 1.7–2.4)

## 2022-01-15 LAB — LEGIONELLA PNEUMOPHILA SEROGP 1 UR AG: L. pneumophila Serogp 1 Ur Ag: NEGATIVE

## 2022-01-15 LAB — PROCALCITONIN: Procalcitonin: <0.1 ng/mL

## 2022-01-15 LAB — PHOSPHORUS: Phosphorus: 3.5 mg/dL (ref 2.5–4.6)

## 2022-01-15 NOTE — Plan of Care (Signed)

## 2022-01-15 NOTE — Progress Notes (Signed)
?PROGRESS NOTE ?Yolanda Burke  KXF:[id number] DOB: 1932/07/03 DOA: 01/13/2022 ?PCP: Virgie Dad, MD  ? ?Brief Narrative/Hospital Course: ?88 y.o. with history of chronic back pain, chronic cystitis with hematuria, diverticulitis, HTN, HLD, nephrolithiasis, cholelithiasis, macular degeneration, hypertensive retinopathy presented to [facility] w/ dyspnea. In ED  had elevated d-dimer. CTA chest was negative for clot, but did show multifocal PNA and a loculated pleural effusion. She was started on rocephin/zithro, placed on antibiotics bronchodilators steroids.  Patient reported that she had visited [place] last week and got sick clear was given IV fluids and was taking antibiotics until she arrived to [place] and completed 6 days of course.  Reports he had heat exhaustion heat exposure there  ?  ?Subjective: ?Seen and examined this morning.  Resting comfortably on the bedside chair not on oxygen reports overall she feels much improved does have some chest pain with deep breath. ? ?Assessment and Plan: ?Principal Problem: ?  Multifocal pneumonia ?Active Problems: ?  GERD (gastroesophageal reflux disease) ?  Pure hypercholesterolemia ?  Hyperglycemia, drug-induced ? ?Multifocal pneumonia ?Productive cough: ?At this time afebrile, hemodynamically stable.  Clinically improving although WBC count has doubled likely from steroid.  Blood culture no growth so far, respiratory virus panel, COVID and influenza negative, sputum gram stain-with few GPC GNC and gram variable rods . She is not wheezing, remains on room air overall clinically improving soo we will discontinue steroids continue on ceftriaxone azithromycin. ?-Curbsided with PCCM Dr. Gilford Raid current antibiotics/agree with current care.  Check procalcitonin, CBC in a.m. ? ?GERD: Not on meds ?Pure hypercholesterolemia-NOT ON MEDS ?Hyperglycemia, drug-induced-monitor.  A1c in 6.7 Diabetic range. Off steroid, monitor. Needs pcp fu. ?Recent Labs  ?Lab 01/14/22 ?1316  01/14/22 ?1614 01/14/22 ?1810 01/14/22 ?1950 01/15/22 ?0003 01/15/22 ?0435  ?GLUCAP  --  [place]ridgeHGBA1C 6.7*  --   --   --   --   --   ?  ?  ?DVT prophylaxis: enoxaparin (LOVENOX) injection 40 mg Start: 01/13/22 2200 ?Code Status:   Code Status: DNR ?Family Communication: plan of care discussed with patient at bedside. ?Patient status is: Inpatient level of care: Telemetry  ?Remains inpatient because: Ongoing management of pneumonia with IV antibiotics ?Patient currently not stable ? ?Dispo: The patient is from: Facility ?           Anticipated disposition: DISPO likely next 1 to 2 days ? ?Mobility Assessment (last 72 hours)   ? ? Mobility Assessment   ? ? [employer] Name 01/14/22 2015 01/13/22 2043  ?  ?  ?  ? Does patient have an order for bedrest or is patient medically unstable No - Continue assessment No - Continue assessment     ? What is the highest level of mobility based on the progressive mobility assessment? Level 4 (Walks with assist in room) - Balance while marching in place and cannot step forward and back - Complete Level 4 (Walks with assist in room) - Balance while marching in place and cannot step forward and back - Complete     ? ?  ?  ? ?  ?Objective: ?Vitals last 24 hrs: ?Vitals:  ? 01/14/22 2016 01/14/22 2038 01/15/22 0433 01/15/22 0848  ?BP:  (!) 133/48 119/65 (!) 139/55  ?Pulse:  85 77 80  ?Resp:  '20 18 16  '$ ?Temp:  (!) 97.5 ?F (36.4 ?C) 97.8 ?F (36.6 ?C) 97.9 ?F (36.6 ?C)  ?TempSrc:  Oral Oral Oral  ?SpO2: 96% 96% 95% 92%  ?Weight:      ?  Height:      ? ?Weight change:  ? ?Physical Examination: ?General exam: AA0X3,older than stated age, weak appearing. ?HEENT:Oral mucosa moist, Ear/Nose WNL grossly, dentition normal. ?Respiratory system: bilaterally BASAL CRACKLES,no use of accessory muscle ?Cardiovascular system: S1 & S2 +, No JVD,. ?Gastrointestinal system: Abdomen soft,NT,ND, BS+ ?Nervous System:Alert, awake, moving extremities and grossly nonfocal ?Extremities: LE edema  none  ,distal peripheral pulses palpable.  ?Skin: No rashes,no icterus. ?MSK: Normal muscle bulk,tone, power ? ?Medications reviewed: Scheduled Meds: ? dextromethorphan-guaiFENesin  1 tablet Oral BID  ? enoxaparin (LOVENOX) injection  40 mg Subcutaneous Q24H  ? insulin aspart  0-15 Units Subcutaneous Q4H  ? ipratropium-albuterol  3 mL Nebulization TID  ? lidocaine  1 patch Transdermal Q24H  ? ?Continuous Infusions: ? azithromycin 500 mg (01/14/22 1449)  ? cefTRIAXone (ROCEPHIN)  IV 2 g (01/15/22 1218)  ? ? ?  ?Diet Order   ? ?       ?  Diet Heart Room service appropriate? Yes; Fluid consistency: Thin  Diet effective now       ?  ? ?  ?  ? ?  ?  ? ?  ?  ?  ? ? ?Intake/Output Summary (Last 24 hours) at 01/15/2022 1243 ?Last data filed at 01/15/2022 5035 ?Gross per 24 hour  ?Intake 1300.24 ml  ?Output --  ?Net 1300.24 ml  ? ?Net IO Since Admission: 2,379.55 mL [01/15/22 1243]  ?Wt Readings from Last 3 Encounters:  ?02-03-2022 61.2 kg  ?09/16/21 62.4 kg  ?06/19/21 61.7 kg  ?  ? ?Unresulted Labs (From admission, onward)  ? ?  Start     Ordered  ? 01/20/22 0500  Creatinine, serum  (enoxaparin (LOVENOX)    CrCl >/= 30 ml/min)  Weekly,   R     ?Comments: while on enoxaparin therapy ?  ? 02/03/22 1832  ? 01/16/22 0500  CBC  Daily,   R     ?Question:  Specimen collection method  Answer:  Lab=Lab collect  ? 01/15/22 0827  ? 01/16/22 4656  Basic metabolic panel  Daily,   R     ?Question:  Specimen collection method  Answer:  Lab=Lab collect  ? 01/15/22 0827  ? 01/15/22 0954  Expectorated Sputum Assessment w Gram Stain, Rflx to Resp Cult  Once,   R       ? 01/15/22 0954  ? 01/14/22 0254  Legionella Pneumophila Serogp 1 Ur Ag  Once,   R       ? 01/14/22 0254  ? 02/03/22 1828  Expectorated Sputum Assessment w Gram Stain, Rflx to [place] - STAT,   STAT       ? 2022/02/03 1832  ? ?  ?  ? ?  ?Data Reviewed: I have personally reviewed following labs and imaging studies ?CBC: ?Recent Labs  ?Lab Feb 03, 2022 ?1019 01/14/22 ?0446 01/15/22 ?0512   ?WBC 13.9* 10.5 20.9*  ?NEUTROABS 9.6* 8.7* 18.3*  ?HGB 11.7* 11.8* 10.8*  ?HCT 35.6* 36.7 32.6*  ?MCV 85.4 88.4 86.5  ?PLT 365 316 363  ? ?Basic Metabolic Panel: ?Recent Labs  ?Lab 02/03/22 ?1033 01/14/22 ?0446 01/15/22 ?0512  ?NA 138 132* 135  ?K 4.2 3.9 4.1  ?CL 104 101 104  ?CO2 23 21* 22  ?GLUCOSE 145* 311* 173*  ?BUN '11 13 17  '$ ?CREATININE 0.76 0.77 0.71  ?CALCIUM 9.3 8.4* 8.8*  ?MG  --  2.0 2.0  ?PHOS  --  3.3 3.5  ? ?GFR: ?Estimated Creatinine Clearance:  41.8 mL/min (by C-G formula based on SCr of 0.71 mg/dL). ?Liver Function Tests: ?Recent Labs  ?Lab 01/13/22 ?1033 01/14/22 ?0446 01/15/22 ?0512  ?AST '21 21 25  '$ ?ALT '28 28 30  '$ ?ALKPHOS 65 66 58  ?BILITOT 0.6 0.4 0.1*  ?PROT 7.0 6.8 6.4*  ?ALBUMIN 3.2* 2.8* 2.6*  ? ?No results for input(s): LIPASE, AMYLASE in the last 168 hours. ?No results for input(s): AMMONIA in the last 168 hours. ?Coagulation Profile: ?No results for input(s): INR, PROTIME in the last 168 hours. ?BNP (last 3 results) ?No results for input(s): PROBNP in the last 8760 hours. ?HbA1C: ?Recent Labs  ?  01/14/22 ?1316  ?HGBA1C 6.7*  ? ?CBG: ?Recent Labs  ?Lab 01/14/22 ?1614 01/14/22 ?1810 01/14/22 ?1950 01/15/22 ?0003 01/15/22 ?0435  ?GLUCAP 280* 273* 279* 157* 154*  ? ?Lipid Profile: ?No results for input(s): CHOL, HDL, LDLCALC, TRIG, CHOLHDL, LDLDIRECT in the last 72 hours. ?Thyroid Function Tests: ?Recent Labs  ?  01/13/22 ?1053  ?TSH 1.427  ? ?Sepsis Labs: ?Recent Labs  ?Lab 01/13/22 ?1033  ?PROCALCITON 0.32  ? ? ?Recent Results (from the past 240 hour(s))  ?Expectorated Sputum Assessment w Gram Stain, Rflx to Resp Cult     Status: None (Preliminary result)  ? Collection Time: 01/13/22 12:13 PM  ? Specimen: Sputum  ?Result Value Ref Range Status  ? Specimen Description   Final  ?  SPUTUM ?Performed at [facility], [address], [place], [place] [address] ?  ? Special Requests   Final  ?  NONE ?Performed at [facility], [address]  [place], [place] [address] ?  ? Sputum evaluation   Final  ?  MODERATE WBC PRESENT,BOTH PMN AND MONONUCLEAR ?FEW GRAM POSITIVE COCCI IN CHAINS ?FEW GRAM NEGATIVE COCCI ?FEW GRAM VARIABLE ROD ?Performed at [facility]

## 2022-01-15 NOTE — Hospital Course (Addendum)
88 y.o. with history of chronic back pain, chronic cystitis with hematuria, diverticulitis, HTN, HLD, nephrolithiasis, cholelithiasis, macular degeneration, hypertensive retinopathy presented to [facility] w/ dyspnea. In ED  had elevated d-dimer. CTA chest was negative for clot, but did show multifocal PNA and a loculated pleural effusion. She was started on rocephin/zithro, placed on antibiotics bronchodilators steroids.  Patient reported that she had visited [place] last week and got sick, heat exhaustion there, was given IV fluids and oral antibiotics for 6 days prior to admission , she arrived to [place] and had completed antibiotics but not improved.  CT scan also showed a loculated pleural effusion and- got chest tube placed by pulmonary 3/23-output is being monitored, continued on suction and managed by pulmonary.  S/P lytics to chest tube. ?Cytology nondiagnostic, pleural fluid LDH 174.  Output has been significantly improved and chest x-ray does not show any pneumothorax and pleural effusion resolved-we will plan to remove chest tube was output less than 100 cc.  Has possible right upper lobe lung nodule will need follow-up CT with pulmonary as outpatient. ?At this time she remains clinically stable on room air, leukocytosis resolved. Of note found to have new onset diabetes placed on metformin educated on Accu-Chek and will provide glucometer upon discharge.  Chest x-ray after chest tube removal was reviewed with pulmonary no need for further antibiotics, she will be followed up as outpatient ?

## 2022-01-16 ENCOUNTER — Inpatient Hospital Stay (HOSPITAL_COMMUNITY): Payer: Medicare Other

## 2022-01-16 DIAGNOSIS — J189 Pneumonia, unspecified organism: Secondary | ICD-10-CM | POA: Diagnosis not present

## 2022-01-16 DIAGNOSIS — J90 Pleural effusion, not elsewhere classified: Secondary | ICD-10-CM

## 2022-01-16 LAB — BASIC METABOLIC PANEL
Anion gap: 8 (ref 5–15)
BUN: 24 mg/dL — ABNORMAL HIGH (ref 8–23)
CO2: 24 mmol/L (ref 22–32)
Calcium: 8.7 mg/dL — ABNORMAL LOW (ref 8.9–10.3)
Chloride: 103 mmol/L (ref 98–111)
Creatinine, Ser: 0.87 mg/dL (ref 0.44–1.00)
GFR, Estimated: >60 mL/min (ref >60)
Glucose, Bld: 133 mg/dL — ABNORMAL HIGH (ref 70–99)
Potassium: 4 mmol/L (ref 3.5–5.1)
Sodium: 135 mmol/L (ref 135–145)

## 2022-01-16 LAB — LACTATE DEHYDROGENASE: LDH: 139 U/L (ref 98–192)

## 2022-01-16 LAB — EXPECTORATED SPUTUM ASSESSMENT W GRAM STAIN, RFLX TO RESP C

## 2022-01-16 LAB — CBC
HCT: 33.1 % — ABNORMAL LOW (ref 36.0–46.0)
Hemoglobin: 10.6 g/dL — ABNORMAL LOW (ref 12.0–15.0)
MCH: 28.4 pg (ref 26.0–34.0)
MCHC: 32 g/dL (ref 30.0–36.0)
MCV: 88.7 fL (ref 80.0–100.0)
Platelets: 375 10*3/uL (ref 150–400)
RBC: 3.73 MIL/uL — ABNORMAL LOW (ref 3.87–5.11)
RDW: 13.4 % (ref 11.5–15.5)
WBC: 16.8 10*3/uL — ABNORMAL HIGH (ref 4.0–10.5)
nRBC: 0 % (ref 0.0–0.2)

## 2022-01-16 LAB — PROCALCITONIN: Procalcitonin: 0.16 ng/mL

## 2022-01-16 LAB — BODY FLUID CELL COUNT WITH DIFFERENTIAL
Eos, Fluid: 1 %
Lymphs, Fluid: 71 %
Monocyte-Macrophage-Serous Fluid: 5 % — ABNORMAL LOW (ref 50–90)
Neutrophil Count, Fluid: 23 % (ref 0–25)
Total Nucleated Cell Count, Fluid: UNDETERMINED cu mm (ref 0–1000)

## 2022-01-16 LAB — LACTATE DEHYDROGENASE, PLEURAL OR PERITONEAL FLUID: LD, Fluid: 174 U/L — ABNORMAL HIGH (ref 3–23)

## 2022-01-16 LAB — PROTEIN, PLEURAL OR PERITONEAL FLUID: Total protein, fluid: <3 g/dL

## 2022-01-16 LAB — GLUCOSE, PLEURAL OR PERITONEAL FLUID: Glucose, Fluid: 150 mg/dL

## 2022-01-16 LAB — GLUCOSE, CAPILLARY
Glucose-Capillary: 138 mg/dL — ABNORMAL HIGH (ref 70–99)
Glucose-Capillary: 148 mg/dL — ABNORMAL HIGH (ref 70–99)
Glucose-Capillary: 170 mg/dL — ABNORMAL HIGH (ref 70–99)
Glucose-Capillary: 283 mg/dL — ABNORMAL HIGH (ref 70–99)
Glucose-Capillary: 75 mg/dL (ref 70–99)
Glucose-Capillary: 179 mg/dL — ABNORMAL HIGH (ref 70–99)

## 2022-01-16 LAB — PROTEIN, TOTAL: Total Protein: 6.6 g/dL (ref 6.5–8.1)

## 2022-01-16 MED ORDER — SODIUM CHLORIDE 0.9% FLUSH
10.0000 mL | Freq: Three times a day (TID) | INTRAVENOUS | Status: DC
  Administered 2022-01-16 – 2022-01-18 (×4): 10 mL

## 2022-01-16 MED ORDER — KETOROLAC TROMETHAMINE 10 MG PO TABS
10.0000 mg | ORAL_TABLET | Freq: Four times a day (QID) | ORAL | Status: DC | PRN
  Administered 2022-01-16 – 2022-01-20 (×13): 10 mg via ORAL
  Filled 2022-01-16 (×17): qty 1

## 2022-01-16 MED ORDER — IPRATROPIUM-ALBUTEROL 0.5-2.5 (3) MG/3ML IN SOLN
3.0000 mL | Freq: Two times a day (BID) | RESPIRATORY_TRACT | Status: DC
  Administered 2022-01-16 – 2022-01-17 (×4): 3 mL via RESPIRATORY_TRACT
  Filled 2022-01-16 (×4): qty 3

## 2022-01-16 MED ORDER — METFORMIN HCL 500 MG PO TABS
500.0000 mg | ORAL_TABLET | Freq: Every day | ORAL | Status: DC
Start: 2022-01-17 — End: 2022-01-20
  Administered 2022-01-17 – 2022-01-20 (×4): 500 mg via ORAL
  Filled 2022-01-16 (×4): qty 1

## 2022-01-16 MED ORDER — SODIUM CHLORIDE (PF) 0.9 % IJ SOLN
10.0000 mg | Freq: Once | INTRAMUSCULAR | Status: AC
  Administered 2022-01-16: 10 mg via INTRAPLEURAL
  Filled 2022-01-16: qty 10

## 2022-01-16 MED ORDER — STERILE WATER FOR INJECTION IJ SOLN
5.0000 mg | Freq: Once | RESPIRATORY_TRACT | Status: AC
  Administered 2022-01-16: 5 mg via INTRAPLEURAL
  Filled 2022-01-16: qty 5

## 2022-01-16 NOTE — Procedures (Signed)
Insertion of Chest Tube Procedure Note ? ?Yolanda Burke  ?[id number]  ?1932/05/06 ? ?Date:01/16/22  ?Time:3:07 PM  ? ? ?Provider Performing: Clementeen Graham  ? ?Procedure: Chest Tube Insertion (20233) ? ?Indication(s) ?Effusion ? ?Consent ?Risks of the procedure as well as the alternatives and risks of each were explained to the patient and/or caregiver.  Consent for the procedure was obtained and is signed in the bedside chart ? ?Anesthesia ?Topical only with 1% lidocaine  ? ? ?Time Out ?Verified patient identification, verified procedure, site/side was marked, verified correct patient position, special equipment/implants available, medications/allergies/relevant history reviewed, required imaging and test results available. ? ? ?Sterile Technique ?Maximal sterile technique including full sterile barrier drape, hand hygiene, sterile gown, sterile gloves, mask, hair covering, sterile ultrasound probe cover (if used). ? ? ?Procedure Description ?Ultrasound used to identify appropriate pleural anatomy for placement and overlying skin marked. Area of placement cleaned and draped in sterile fashion.  A 14 French pigtail pleural catheter was placed into the right pleural space using Seldinger technique. Appropriate return of fluid was obtained.  The tube was connected to atrium and placed on -20 cm H2O wall suction. ? ? ?Complications/Tolerance ?None; patient tolerated the procedure well. ?Chest X-ray is ordered to verify placement. ? ? ?EBL ?Minimal ? ?Specimen(s) ?fluid ?Erick Colace ACNP-BC ?[facility] ?Pager # [phone] OR # [phone] if no answer ? ?

## 2022-01-16 NOTE — Progress Notes (Signed)
?PROGRESS NOTE ?Yolanda Burke  NIO:[id number] DOB: 1931-12-15 DOA: 01/13/2022 ?PCP: Virgie Dad, MD  ? ?Brief Narrative/Hospital Course: ?88 y.o. with history of chronic back pain, chronic cystitis with hematuria, diverticulitis, HTN, HLD, nephrolithiasis, cholelithiasis, macular degeneration, hypertensive retinopathy presented to [facility] w/ dyspnea. In ED  had elevated d-dimer. CTA chest was negative for clot, but did show multifocal PNA and a loculated pleural effusion. She was started on rocephin/zithro, placed on antibiotics bronchodilators steroids.  Patient reported that she had visited EGD last week and got sick clear was given IV fluids and was taking antibiotics until she arrived to [place] and completed 6 days of course.  Reports he had heat exhaustion heat exposure there  ?  ?Subjective: ?Seen and examined, complains of some dry cough overall much improved has been ambulating on room air.  No fever.  WBC count downtrending.   ? ?Assessment and Plan: ?Principal Problem: ?  Multifocal pneumonia ?Active Problems: ?  GERD (gastroesophageal reflux disease) ?  Pure hypercholesterolemia ?  Hyperglycemia, drug-induced ?  Pleural effusion on right ? ?Multifocal pneumonia ?Productive cough ?Loculated right pleural effusion: ?Appears to be clinically improving.  On room air, cough improving. WBC count had doubled likely from steroid> now downtrending, off steroids.  Blood culture no growth so far,Respiratory virus panel, COVID and influenza negative, sputum gram stain-with few GPC GNC and gram variable rods-culture is still pending. ?Continue on ceftriaxone azithromycin.  Given pleural effusion repeating chest x-ray today 3 views and will discuss with pulmonary again. Procal slightly up/overall better. ?Recent Labs  ?Lab 01/13/22 ?1019 01/13/22 ?1033 01/14/22 ?0446 01/15/22 ?8182 01/15/22 ?9937 01/16/22 ?0439  ?WBC 13.9*  --  10.5 20.9*  --  16.8*  ?PROCALCITON  --  0.32  --   --  <0.10 0.16  ? ? ?GERD: Not on  meds ?Pure hypercholesterolemia-NOT ON MEDS ?Hyperglycemia, drug-induced ?Type 2 diabetes mellitus new onset: ?A1c in 6.7 Diabetic range.  Add metformin will educate on Accu-Chek Needs pcp fu.monitor ssi ?Recent Labs  ?Lab 01/14/22 ?1316 01/14/22 ?1614 01/15/22 ?1548 01/15/22 ?2044 01/15/22 ?2343 01/16/22 ?1696 01/16/22 ?7893  ?GLUCAP  --    < > 283* 148* 170* 138* 75  ?HGBA1C 6.7*  --   --   --   --   --   --   ? < > = values in this interval not displayed.  ?  ?  ?DVT prophylaxis: enoxaparin (LOVENOX) injection 40 mg Start: 01/13/22 2200 ?Code Status:   Code Status: DNR ?Family Communication: plan of care discussed with patient at bedside. ?Patient status is: Inpatient level of care: Telemetry  ?Remains inpatient because: Ongoing management of pneumonia with IV antibiotics ?Patient currently not stable ? ?Dispo: The patient is from: Independent living facility ?           Anticipated disposition: DISPO back to facility in next day   ? ?Mobility Assessment (last 72 hours)   ? ? Mobility Assessment   ? ? [place] Name 01/15/22 1200 01/14/22 2015 01/13/22 2043  ?  ?  ? Does patient have an order for bedrest or is patient medically unstable No - Continue assessment No - Continue assessment No - Continue assessment    ? What is the highest level of mobility based on the progressive mobility assessment? Level 6 (Walks independently in room and hall) - Balance while walking in room without assist - Complete Level 4 (Walks with assist in room) - Balance while marching in place and cannot step forward and back -  Complete Level 4 (Walks with assist in room) - Balance while marching in place and cannot step forward and back - Complete    ? ?  ?  ? ?  ?Objective: ?Vitals last 24 hrs: ?Vitals:  ? 01/15/22 1944 01/15/22 2110 01/16/22 0413 01/16/22 0735  ?BP:  136/65 (!) 138/57   ?Pulse:  85 80   ?Resp:  (!) 24 14   ?Temp:  98.2 ?F (36.8 ?C) 98.9 ?F (37.2 ?C)   ?TempSrc:  Oral Oral   ?SpO2: 94% 90% 95% 96%  ?Weight:      ?Height:       ? ?Weight change:  ? ?Physical Examination: ?General exam: AAOX3,older than stated age, weak appearing. ?HEENT:Oral mucosa moist, Ear/Nose WNL grossly, dentition normal. ?Respiratory system: bilaterally air entry present slightly diminished on the right side,no use of accessory muscle ?Cardiovascular system: S1 & S2 +, No JVD,. ?Gastrointestinal system: Abdomen soft,NT,ND, BS+ ?Nervous System:Alert, awake, moving extremities and grossly nonfocal ?Extremities: edema neg,distal peripheral pulses palpable.  ?Skin: No rashes,no icterus. ?MSK: Normal muscle bulk,tone, power ? ? ?Medications reviewed: Scheduled Meds: ? dextromethorphan-guaiFENesin  1 tablet Oral BID  ? enoxaparin (LOVENOX) injection  40 mg Subcutaneous Q24H  ? insulin aspart  0-15 Units Subcutaneous Q4H  ? ipratropium-albuterol  3 mL Nebulization BID  ? lidocaine  1 patch Transdermal Q24H  ? [START ON 01/17/2022] metFORMIN  500 mg Oral Q breakfast  ? ?Continuous Infusions: ? azithromycin 500 mg (01/15/22 1420)  ? cefTRIAXone (ROCEPHIN)  IV 2 g (01/15/22 1218)  ? ? ?  ?Diet Order   ? ?       ?  Diet Heart Room service appropriate? Yes; Fluid consistency: Thin  Diet effective now       ?  ? ?  ?  ? ?  ? ?Unresulted Labs (From admission, onward)  ? ?  Start     Ordered  ? 01/20/22 0500  Creatinine, serum  (enoxaparin (LOVENOX)    CrCl >/= 30 ml/min)  Weekly,   R     ?Comments: while on enoxaparin therapy ?  ? 2022/02/07 1832  ? 01/16/22 0500  CBC  Daily,   R     ?Question:  Specimen collection method  Answer:  Lab=Lab collect  ? 01/15/22 0827  ? 01/16/22 1950  Basic metabolic panel  Daily,   R     ?Question:  Specimen collection method  Answer:  Lab=Lab collect  ? 01/15/22 0827  ? 01/16/22 0500  Procalcitonin  Daily,   R     ? 01/15/22 1250  ? 01/15/22 0954  Expectorated Sputum Assessment w Gram Stain, Rflx to Resp Cult  Once,   R       ? 01/15/22 0954  ? 02-07-2022 1828  Expectorated Sputum Assessment w Gram Stain, Rflx to [place] - STAT,   STAT       ?  02-07-22 1832  ? ?  ?  ? ?  ?Data Reviewed: I have personally reviewed following labs and imaging studies ?CBC: ?Recent Labs  ?Lab 07-Feb-2022 ?1019 01/14/22 ?0446 01/15/22 ?9326 01/16/22 ?0439  ?WBC 13.9* 10.5 20.9* 16.8*  ?NEUTROABS 9.6* 8.7* 18.3*  --   ?HGB 11.7* 11.8* 10.8* 10.6*  ?HCT 35.6* 36.7 32.6* 33.1*  ?MCV 85.4 88.4 86.5 88.7  ?PLT 365 316 363 375  ? ?Basic Metabolic Panel: ?Recent Labs  ?Lab 02-07-2022 ?1033 01/14/22 ?0446 01/15/22 ?7124 01/16/22 ?0439  ?NA 138 132* 135 135  ?K 4.2 3.9  4.1 4.0  ?CL 104 101 104 103  ?CO2 23 21* 22 24  ?GLUCOSE 145* 311* 173* 133*  ?BUN '11 13 17 '$ 24*  ?CREATININE 0.76 0.77 0.71 0.87  ?CALCIUM 9.3 8.4* 8.8* 8.7*  ?MG  --  2.0 2.0  --   ?PHOS  --  3.3 3.5  --   ? ?GFR: ?Estimated Creatinine Clearance: 38.5 mL/min (by C-G formula based on SCr of 0.87 mg/dL). ?Liver Function Tests: ?Recent Labs  ?Lab 01/13/22 ?1033 01/14/22 ?0446 01/15/22 ?0512  ?AST '21 21 25  '$ ?ALT '28 28 30  '$ ?ALKPHOS 65 66 58  ?BILITOT 0.6 0.4 0.1*  ?PROT 7.0 6.8 6.4*  ?ALBUMIN 3.2* 2.8* 2.6*  ? ?No results for input(s): LIPASE, AMYLASE in the last 168 hours. ?No results for input(s): AMMONIA in the last 168 hours. ?Coagulation Profile: ?No results for input(s): INR, PROTIME in the last 168 hours. ?BNP (last 3 results) ?No results for input(s): PROBNP in the last 8760 hours. ?HbA1C: ?Recent Labs  ?  01/14/22 ?1316  ?HGBA1C 6.7*  ? ?CBG: ?Recent Labs  ?Lab 01/15/22 ?1548 01/15/22 ?2044 01/15/22 ?2343 01/16/22 ?7035 01/16/22 ?0093  ?GLUCAP 283* 148* 170* 138* 75  ? ?Lipid Profile: ?No results for input(s): CHOL, HDL, LDLCALC, TRIG, CHOLHDL, LDLDIRECT in the last 72 hours. ?Thyroid Function Tests: ?No results for input(s): TSH, T4TOTAL, FREET4, T3FREE, THYROIDAB in the last 72 hours. ? ?Sepsis Labs: ?Recent Labs  ?Lab 01/13/22 ?1033 01/15/22 ?8182 01/16/22 ?0439  ?PROCALCITON 0.32 <0.10 0.16  ? ? ?Recent Results (from the past 240 hour(s))  ?Expectorated Sputum Assessment w Gram Stain, Rflx to Resp Cult     Status:  None  ? Collection Time: 01/13/22 12:13 PM  ? Specimen: Sputum  ?Result Value Ref Range Status  ? Specimen Description   Final  ?  SPUTUM ?Performed at [facility], [place]

## 2022-01-16 NOTE — Clinical Social Work Note (Signed)
?  Transition of Care (TOC) Screening Note ? ? ?Patient Details  ?Name: Yolanda Burke ?Date of Birth: October 27, 1932 ? ? ?Transition of Care (TOC) CM/SW Contact:    ?Trish Mage, LCSW ?Phone Number: ?01/16/2022, 11:45 AM ? ? ? ?Transition of Care Department [facility]) has reviewed patient and no TOC needs have been identified at this time. We will continue to monitor patient advancement through interdisciplinary progression rounds. If new patient transition needs arise, please place a TOC consult. ? ?Patient is from [place] ? ? ?

## 2022-01-16 NOTE — Plan of Care (Signed)
?  Problem: Clinical Measurements: ?Goal: Will remain free from infection ?Outcome: Progressing ?  ?Problem: Clinical Measurements: ?Goal: Diagnostic test results will improve ?Outcome: Progressing ?  ?Problem: Clinical Measurements: ?Goal: Respiratory complications will improve ?Outcome: Progressing ?  ?Problem: Nutrition: ?Goal: Adequate nutrition will be maintained ?Outcome: Progressing ?  ?Problem: Activity: ?Goal: Risk for activity intolerance will decrease ?Outcome: Progressing ?  ?Problem: Pain Managment: ?Goal: General experience of comfort will improve ?Outcome: Progressing ?  ?

## 2022-01-16 NOTE — Procedures (Signed)
Pleural Fibrinolytic Administration Procedure Note ? ?Yolanda Burke  ?[id number]  ?December 10, 1931 ? ?Date:01/16/22  ?Time:5:10 PM  ? ?Provider Performing:Pete E Kary Kos  ? ?Procedure: Pleural Fibrinolysis Initial day (36644) ? ?Indication(s) ?Fibrinolysis of complicated pleural effusion ? ?Consent ?Risks of the procedure as well as the alternatives and risks of each were explained to the patient and/or caregiver.  Consent for the procedure was obtained. ? ? ?Anesthesia ?None ? ? ?Time Out ?Verified patient identification, verified procedure, site/side was marked, verified correct patient position, special equipment/implants available, medications/allergies/relevant history reviewed, required imaging and test results available. ? ? ?Sterile Technique ?Hand hygiene, gloves ? ? ?Procedure Description ?Existing pleural catheter was cleaned and accessed in sterile manner.  '10mg'$  of tPA in 30cc of saline and '5mg'$  of dornase in 30cc of sterile water were injected into pleural space using existing pleural catheter.  Catheter will be clamped for 1 hour and then placed back to suction. ? ? ?Complications/Tolerance ?None; patient tolerated the procedure well. ? ?EBL ?None ? ? ?Specimen(s) ?None  ? ?Erick Colace ACNP-BC ?[place] ?Pager # [phone] OR # [phone] if no answer ? ?

## 2022-01-16 NOTE — Progress Notes (Signed)
Q4 BGs ordered. Glucometer not uploading results into chart. 2000, 0000, 0400 blood glucose results have been manually entered on the Capillary Blood Glucose Monitoring Flowsheet ?

## 2022-01-16 NOTE — Consult Note (Signed)
? ?NAME:  Yolanda Burke, MRN:  [id number], DOB:  January 25, 1932, LOS: 3 ?ADMISSION DATE:  01/13/2022, CONSULTATION DATE:  3/23 ?REFERRING MD:  Lupita Leash, CHIEF COMPLAINT:  loculated effusion   ? ?History of Present Illness:  ?88 year old female who was admitted on 3/20 with chief complaint of shortness of breath.  She had recently returned from a trip in [place].  During that time she was actually hospitalized for dehydration and possible pneumonia treated with IV hydration and antibiotics.  Since she returned home she has continued to have ongoing right-sided pleuritic chest discomfort, persistent productive cough of yellow/clear sputum, and persistent shortness of breath and therefore sought evaluation in the emergency room.  On evaluation initial chest x-ray suggested right lower lobe pneumonia and possible loculated right effusion a CT chest was obtained to further evaluate these findings.  It was negative for pulmonary emboli, it did show right middle and right lower lobe airspace disease as well as multiloculated right pleural effusion.  She was admitted to the hospital, IV hydration therapeutic interventions to date have included, and empiric antibiotics she was started on azithromycin and ceftriaxone on 3/20.  A follow-up chest x-ray was obtained on 3/23 at which time right lower lobe consolidation improved a little, however still had significant area of right-sided airspace disease and right pleural effusion for which pulmonary was consulted. ? ?Pertinent  Medical History  ? ?Hypertension, hyperlipidemia, nephrolithiasis, cholelithiasis, macular degeneration, hypertensive retinopathy, chronic back pain. ? ?Significant Hospital Events: ?Including procedures, antibiotic start and stop dates in addition to other pertinent events   ?3/20 admitted with working diagnosis of community-acquired pneumonia plus pleural effusion.  Started on azithromycin and ceftriaxone ?3/23 pulmonary asked to evaluate for multiloculated right  pleural effusion ? ?Interim History / Subjective:  ?Tolerated CT well  ? ?Objective   ?Blood pressure (Abnormal) 138/57, pulse 80, temperature 98.9 ?F (37.2 ?C), temperature source Oral, resp. rate 14, height '5\' 2"'$  (1.575 m), weight 61.2 kg, SpO2 96 %. ?   ?   ? ?Intake/Output Summary (Last 24 hours) at 01/16/2022 1505 ?Last data filed at 01/15/2022 2124 ?Gross per 24 hour  ?Intake 120 ml  ?Output no documentation  ?Net 120 ml  ? ?Filed Weights  ? 01/13/22 1012  ?Weight: 61.2 kg  ? ? ?Examination: ?General: 88 year old female she is in no acute distress  ?HENT: NCAT no JVD MMM  ?Lungs: clear. Dec right base. Now has chest tube. Cnc/cloudy yellow pleural fluid. No air leak  ?Cardiovascular: RRR ?Abdomen: soft not tender  ?Extremities: warm and dry  ?Neuro: intact  ?GU: coids ? ?Resolved Hospital Problem list   ? ? ?Assessment & Plan:  ?Principal Problem: ?  Multifocal pneumonia ?Active Problems: ?  GERD (gastroesophageal reflux disease) ?  Pure hypercholesterolemia ?  Hyperglycemia, drug-induced ?  Loculated pleural effusion ? ? ?Community-acquired pneumonia with associated multiloculated pleural effusion. ?Plan ?Continue current antibiotics, she is on day #3 of ceftriaxone and azithromycin ?Chest tube now placed. Keep to sxn. Samples sent for inflammatory and infection markers.  ?Anticipate intra pleural TPA and DNase later this afternoon ?Then keep CT to sxn ?Repeat am cxr ?Likely 3 total days of intrapleural TPA/DNase depending on response.  ? ? ?Best Practice (right click and "Reselect all SmartList Selections" daily)  ? ?Per primary  ? ?Labs   ?CBC: ?Recent Labs  ?Lab 01/13/22 ?1019 01/14/22 ?0446 01/15/22 ?9629 01/16/22 ?0439  ?WBC 13.9* 10.5 20.9* 16.8*  ?NEUTROABS 9.6* 8.7* 18.3*  --   ?HGB 11.7* 11.8* 10.8*  10.6*  ?HCT 35.6* 36.7 32.6* 33.1*  ?MCV 85.4 88.4 86.5 88.7  ?PLT 365 316 363 375  ? ? ?Basic Metabolic Panel: ?Recent Labs  ?Lab 01/13/22 ?1033 01/14/22 ?0446 01/15/22 ?7341 01/16/22 ?0439  ?NA 138 132*  135 135  ?K 4.2 3.9 4.1 4.0  ?CL 104 101 104 103  ?CO2 23 21* 22 24  ?GLUCOSE 145* 311* 173* 133*  ?BUN '11 13 17 '$ 24*  ?CREATININE 0.76 0.77 0.71 0.87  ?CALCIUM 9.3 8.4* 8.8* 8.7*  ?MG  --  2.0 2.0  --   ?PHOS  --  3.3 3.5  --   ? ?GFR: ?Estimated Creatinine Clearance: 38.5 mL/min (by C-G formula based on SCr of 0.87 mg/dL). ?Recent Labs  ?Lab 01/13/22 ?1019 01/13/22 ?1033 01/14/22 ?0446 01/15/22 ?9379 01/15/22 ?0240 01/16/22 ?0439  ?PROCALCITON  --  0.32  --   --  <0.10 0.16  ?WBC 13.9*  --  10.5 20.9*  --  16.8*  ? ? ?Liver Function Tests: ?Recent Labs  ?Lab 01/13/22 ?1033 01/14/22 ?0446 01/15/22 ?0512  ?AST '21 21 25  '$ ?ALT '28 28 30  '$ ?ALKPHOS 65 66 58  ?BILITOT 0.6 0.4 0.1*  ?PROT 7.0 6.8 6.4*  ?ALBUMIN 3.2* 2.8* 2.6*  ? ?No results for input(s): LIPASE, AMYLASE in the last 168 hours. ?No results for input(s): AMMONIA in the last 168 hours. ? ?ABG ?No results found for: PHART, PCO2ART, PO2ART, HCO3, TCO2, ACIDBASEDEF, O2SAT  ? ?Coagulation Profile: ?No results for input(s): INR, PROTIME in the last 168 hours. ? ?Cardiac Enzymes: ?No results for input(s): CKTOTAL, CKMB, CKMBINDEX, TROPONINI in the last 168 hours. ? ?HbA1C: ?Hgb A1c MFr Bld  ?Date/Time Value Ref Range Status  ?01/14/2022 01:16 PM 6.7 (H) 4.8 - 5.6 % Final  ?  Comment:  ?  (NOTE) ?Pre diabetes:          5.7%-6.4% ? ?Diabetes:              >6.4% ? ?Glycemic control for   <7.0% ?adults with diabetes ?  ? ? ?CBG: ?Recent Labs  ?Lab 01/15/22 ?2044 01/15/22 ?2343 01/16/22 ?9735 01/16/22 ?3299 01/16/22 ?1233  ?GLUCAP 148* 170* 138* 75 179*  ? ? ?Review of Systems:   ?Review of Systems  ?Constitutional: Negative.   ?HENT: Negative.    ?Eyes: Negative.   ?Respiratory:  Positive for cough, sputum production and shortness of breath.   ?Cardiovascular:  Positive for chest pain.  ?Gastrointestinal: Negative.   ?Genitourinary: Negative.   ?Musculoskeletal: Negative.   ?Neurological: Negative.   ?Endo/Heme/Allergies: Negative.   ?Psychiatric/Behavioral: Negative.     ? ? ?Past Medical History:  ?She,  has a past medical history of Allergy, Arthritis, Back pain, Chronic cystitis with hematuria, Colon polyps, Cystocele, unspecified (CODE), Diverticulitis, Dyslipidemia, Gallstones, Gastric ulcer, Gastritis, GERD (gastroesophageal reflux disease), Hearing loss, Hemorrhoids, Hyperlipidemia, Hypertension, Hypertensive retinopathy, Impaired fasting glucose, Macular degeneration, Melanoma in situ of neck ([place]), and Renal insufficiency.  ? ?Surgical History:  ? ?Past Surgical History:  ?Procedure Laterality Date  ? APPENDECTOMY    ? CATARACT EXTRACTION Bilateral   ? Dr. Loni Dolly - [place], [place]  ? CHOLECYSTECTOMY    ? with appendectomy  ? EYE SURGERY Bilateral   ? Cat Sx - Dr. Loni Dolly - [place], [place]  ? THROAT SURGERY    ? calcified ligament  ? TONSILLECTOMY    ?  ? ?Social History:  ? reports that she quit smoking about 36 years ago. Her smoking use included cigarettes. She has never used smokeless  tobacco. She reports current alcohol use. She reports that she does not use drugs.  ? ?Family History:  ?Her family history includes Bladder Cancer in her brother; Dementia in her mother; Heart disease in her brother; Heart failure in her mother; Kidney failure in her father; Pneumonia in her father.  ? ?Allergies ?Allergies  ?Allergen Reactions  ? Oysters [Shellfish Allergy] Swelling  ? Sulfa Antibiotics   ?  CANNOT REMEMBER REACTION  ?  ? ?Home Medications  ?Prior to Admission medications   ?Medication Sig Start Date End Date Taking? Authorizing Provider  ?Multiple Vitamins-Minerals (ICAPS AREDS 2 PO) Take 1 tablet by mouth 2 (two) times daily.   Yes [provider]  ?  ? ?Erick Colace ACNP-BC ?[place] ?Pager # [phone] OR # [phone] if no answer ? ? ? ? ?

## 2022-01-17 ENCOUNTER — Inpatient Hospital Stay (HOSPITAL_COMMUNITY): Payer: Medicare Other

## 2022-01-17 DIAGNOSIS — J189 Pneumonia, unspecified organism: Secondary | ICD-10-CM | POA: Diagnosis not present

## 2022-01-17 LAB — CBC
HCT: 33.9 % — ABNORMAL LOW (ref 36.0–46.0)
Hemoglobin: 11.5 g/dL — ABNORMAL LOW (ref 12.0–15.0)
MCH: 28.8 pg (ref 26.0–34.0)
MCHC: 33.9 g/dL (ref 30.0–36.0)
MCV: 85 fL (ref 80.0–100.0)
Platelets: 392 10*3/uL (ref 150–400)
RBC: 3.99 MIL/uL (ref 3.87–5.11)
RDW: 13.3 % (ref 11.5–15.5)
WBC: 14.8 10*3/uL — ABNORMAL HIGH (ref 4.0–10.5)
nRBC: 0 % (ref 0.0–0.2)

## 2022-01-17 LAB — BASIC METABOLIC PANEL
Anion gap: 5 (ref 5–15)
BUN: 17 mg/dL (ref 8–23)
CO2: 25 mmol/L (ref 22–32)
Calcium: 8 mg/dL — ABNORMAL LOW (ref 8.9–10.3)
Chloride: 102 mmol/L (ref 98–111)
Creatinine, Ser: 0.72 mg/dL (ref 0.44–1.00)
GFR, Estimated: >60 mL/min (ref >60)
Glucose, Bld: 134 mg/dL — ABNORMAL HIGH (ref 70–99)
Potassium: 3.8 mmol/L (ref 3.5–5.1)
Sodium: 132 mmol/L — ABNORMAL LOW (ref 135–145)

## 2022-01-17 LAB — PROCALCITONIN: Procalcitonin: <0.1 ng/mL

## 2022-01-17 LAB — GLUCOSE, CAPILLARY: Glucose-Capillary: 92 mg/dL (ref 70–99)

## 2022-01-17 LAB — CYTOLOGY - NON PAP

## 2022-01-17 MED ORDER — INSULIN ASPART 100 UNIT/ML IJ SOLN
0.0000 [IU] | Freq: Three times a day (TID) | INTRAMUSCULAR | Status: DC
  Administered 2022-01-17: 3 [IU] via SUBCUTANEOUS
  Administered 2022-01-17: 2 [IU] via SUBCUTANEOUS
  Administered 2022-01-19: 1 [IU] via SUBCUTANEOUS

## 2022-01-17 MED ORDER — SODIUM CHLORIDE 0.9% FLUSH
10.0000 mL | Freq: Three times a day (TID) | INTRAVENOUS | Status: DC
  Administered 2022-01-17 – 2022-01-19 (×7): 10 mL

## 2022-01-17 MED ORDER — IPRATROPIUM-ALBUTEROL 0.5-2.5 (3) MG/3ML IN SOLN: 3.0000 mL | Freq: Four times a day (QID) | RESPIRATORY_TRACT | Status: DC | PRN

## 2022-01-17 MED ORDER — STERILE WATER FOR INJECTION IJ SOLN
5.0000 mg | Freq: Once | RESPIRATORY_TRACT | Status: AC
  Administered 2022-01-17: 5 mg via INTRAPLEURAL
  Filled 2022-01-17: qty 5

## 2022-01-17 MED ORDER — SODIUM CHLORIDE (PF) 0.9 % IJ SOLN
10.0000 mg | Freq: Once | INTRAMUSCULAR | Status: AC
  Administered 2022-01-17: 10 mg via INTRAPLEURAL
  Filled 2022-01-17: qty 10

## 2022-01-17 MED ORDER — HYDROCODONE-ACETAMINOPHEN 5-325 MG PO TABS: 1.0000 | ORAL_TABLET | Freq: Four times a day (QID) | ORAL | Status: DC | PRN

## 2022-01-17 NOTE — Progress Notes (Signed)
? ?NAME:  Yolanda Burke, MRN:  [id number], DOB:  10-Jan-1932, LOS: 4 ?ADMISSION DATE:  01/13/2022, CONSULTATION DATE:  3/23 ?REFERRING MD:  Lupita Leash, CHIEF COMPLAINT:  loculated effusion   ? ?History of Present Illness:  ?88 year old female who was admitted on 3/20 with chief complaint of shortness of breath.  She had recently returned from a trip in [place].  During that time she was actually hospitalized for dehydration and possible pneumonia treated with IV hydration and antibiotics.  Since she returned home she has continued to have ongoing right-sided pleuritic chest discomfort, persistent productive cough of yellow/clear sputum, and persistent shortness of breath and therefore sought evaluation in the emergency room.  On evaluation initial chest x-ray suggested right lower lobe pneumonia and possible loculated right effusion a CT chest was obtained to further evaluate these findings.  It was negative for pulmonary emboli, it did show right middle and right lower lobe airspace disease as well as multiloculated right pleural effusion.  She was admitted to the hospital, IV hydration therapeutic interventions to date have included, and empiric antibiotics she was started on azithromycin and ceftriaxone on 3/20.  A follow-up chest x-ray was obtained on 3/23 at which time right lower lobe consolidation improved a little, however still had significant area of right-sided airspace disease and right pleural effusion for which pulmonary was consulted. ? ?Pertinent  Medical History  ? ?Hypertension, hyperlipidemia, nephrolithiasis, cholelithiasis, macular degeneration, hypertensive retinopathy, chronic back pain. ? ?Significant Hospital Events: ?Including procedures, antibiotic start and stop dates in addition to other pertinent events   ?3/20 admitted with working diagnosis of community-acquired pneumonia plus pleural effusion.  Started on azithromycin and ceftriaxone ?3/23 pulmonary asked to evaluate for multiloculated right  pleural effusion: eval c/ w exudate  ? ?Interim History / Subjective:  ? ?No distress this morning feels much better.  Pleuritic chest pain will managed with Toradol, pleural output evaluated, she has put out about 900 mL since chest tube placed ?Objective   ?Blood pressure (Abnormal) 118/44, pulse 75, temperature 98.5 ?F (36.9 ?C), temperature source Oral, resp. rate 19, height '5\' 2"'$  (1.575 m), weight 61.2 kg, SpO2 96 %. ?   ?   ? ?Intake/Output Summary (Last 24 hours) at 01/17/2022 1134 ?Last data filed at 01/17/2022 0820 ?Gross per 24 hour  ?Intake 1740 ml  ?Output 901 ml  ?Net 839 ml  ? ? ?Filed Weights  ? 01/13/22 1012  ?Weight: 61.2 kg  ? ? ?Examination: ?General: This is a very pleasant 88 year old female patient who is resting comfortably in bed today and in no acute distress ?HEENT normocephalic atraumatic no jugular venous distention appreciated ?Pulmonary: Clear to auscultation diminished bases, chest tube assessment with bloody pleural output, totaling 900 mL as of 3/24 there is no airleak noted in [place] system. ?Cardiac regular rate and rhythm ?Abdomen soft not tender ?Extremities warm dry ?Neuro intact ? ?Resolved Hospital Problem list   ? ? ?Assessment & Plan:  ?Principal Problem: ?  Multifocal pneumonia ?Active Problems: ?  GERD (gastroesophageal reflux disease) ?  Pure hypercholesterolemia ?  Hyperglycemia, drug-induced ?  Loculated pleural effusion ? ? ?Community-acquired pneumonia with associated multiloculated exudative pleural effusion. ?Plan ?Day #4 ceftriaxone and azithromycin  ?Today will be day #2 tPA and DNase with plan to complete 3 total days  ?Continue chest tube to suction  ?Continue Chest tube maintenance  ? ?Possible right upper lobe lung nodule ?Plan ?Follow-up chest x-ray, if not clearly will eventually need follow-up CT scan ? ?Best Practice (right click  and "Reselect all SmartList Selections" daily)  ? ?Per primary  ? ?  ? ?Erick Colace ACNP-BC ?[place] ?Pager # [phone] OR # [phone] if no answer ? ? ? ? ?

## 2022-01-17 NOTE — Progress Notes (Signed)
?PROGRESS NOTE ?Yolanda Burke  WUJ:[id number] DOB: October 31, 1931 DOA: 01/13/2022 ?PCP: Virgie Dad, MD  ? ?Brief Narrative/Hospital Course: ?88 y.o. with history of chronic back pain, chronic cystitis with hematuria, diverticulitis, HTN, HLD, nephrolithiasis, cholelithiasis, macular degeneration, hypertensive retinopathy presented to [facility] w/ dyspnea. In ED  had elevated d-dimer. CTA chest was negative for clot, but did show multifocal PNA and a loculated pleural effusion. She was started on rocephin/zithro, placed on antibiotics bronchodilators steroids.  Patient reported that she had visited [place] last week and got sick, heat exhaustion there, was given IV fluids and oral antibiotics for 6 days prior to admission , she arrived to [place] and had completed antibiotics but not improved.  CT scan also showed a loculated pleural effusion and- got chest tube placed by pulmonary 3/23  ?  ?Subjective: ?Seen and examined this morning.  On the bedside chair just got pain medication, prior to that was having pain in the chest tube site, remains on room air ?Overnight no fever ?BP stable ?Chest tube in place ? ?Assessment and Plan: ?Principal Problem: ?  Multifocal pneumonia ?Active Problems: ?  GERD (gastroesophageal reflux disease) ?  Pure hypercholesterolemia ?  Hyperglycemia, drug-induced ?  Loculated pleural effusion ? ?Multifocal pneumonia ?Productive cough ?Loculated right pleural effusion-s/p right chest tube placement 3/23: ?Clinically improving, on room air.  Appreciate pulmonary input put underwent chest tube placement for loculated pleural effusion 3/23.  Follow-up pleural fluid culture, blood culture no growth so far, sputum culture no growth.  Continue with current ceftriaxone, azithromycin antitussives bronchodilators.  Follow-up serial chest x-ray pending this morning, continue Toradol/Tylenol for pain control ?Recent Labs  ?Lab 01/13/22 ?1019 01/13/22 ?1033 01/14/22 ?0446 01/15/22 ?9562 01/15/22 ?1308  01/16/22 ?6578 01/17/22 ?0427  ?WBC 13.9*  --  10.5 20.9*  --  16.8* 14.8*  ?PROCALCITON  --  0.32  --   --  <0.10 0.16 <0.10  ? ?Leukocytosis bump was after steroid, now downtrending. ?GERD: Not on meds ?Pure hypercholesterolemia-NOT ON MEDS ?Hyperglycemia, drug-induced ?Type 2 diabetes mellitus new onset: ?A1c in 6.7 Diabetic range.  Added metformin will educate on Accu-Chek Needs pcp fu.monitor ssi ?Recent Labs  ?Lab 01/14/22 ?1316 01/14/22 ?1614 01/15/22 ?2343 01/16/22 ?4696 01/16/22 ?0806 01/16/22 ?1233 01/16/22 ?1757  ?GLUCAP  --    < > 170* 138* 75 179* 92  ?HGBA1C 6.7*  --   --   --   --   --   --   ? < > = values in this interval not displayed.  ?  ?  ?DVT prophylaxis: enoxaparin (LOVENOX) injection 40 mg Start: 01/13/22 2200 ?Code Status:   Code Status: DNR ?Family Communication: plan of care discussed with patient at bedside. ?Patient status is: Inpatient level of care: Med-Surg  ?Remains inpatient because: Ongoing management of pneumonia with IV antibiotics ?Patient currently not stable ? ?Dispo: The patient is from: Independent living facility ?           Anticipated disposition: DISPO back to facility 2 days ? ?Mobility Assessment (last 72 hours)   ? ? Mobility Assessment   ? ? Row Name 01/16/22 2044 01/16/22 2039 01/16/22 1000 01/15/22 1200 01/14/22 2015  ? Does patient have an order for bedrest or is patient medically unstable No - Continue assessment No - Continue assessment No - Continue assessment No - Continue assessment No - Continue assessment  ? What is the highest level of mobility based on the progressive mobility assessment? Level 6 (Walks independently in room and hall) - Balance  while walking in room without assist - Complete Level 6 (Walks independently in room and hall) - Balance while walking in room without assist - Complete Level 6 (Walks independently in room and hall) - Balance while walking in room without assist - Complete Level 6 (Walks independently in room and hall) - Balance  while walking in room without assist - Complete Level 4 (Walks with assist in room) - Balance while marching in place and cannot step forward and back - Complete  ? ?  ?  ? ?  ?Objective: ?Vitals last 24 hrs: ?Vitals:  ? 01/16/22 1947 01/16/22 2048 01/17/22 0457 01/17/22 0743  ?BP:  (!) 135/58 (!) 118/44   ?Pulse:  89 75   ?Resp:  20 19   ?Temp:  98.6 ?F (37 ?C) 98.5 ?F (36.9 ?C)   ?TempSrc:  Oral Oral   ?SpO2: 92% 92% 95% 96%  ?Weight:      ?Height:      ? ?Weight change:  ? ?Physical Examination: ?General exam: AA0x3, pleasant, not in distress, elderly. ?HEENT:Oral mucosa moist, Ear/Nose WNL grossly, dentition normal. ?Respiratory system: bilaterally air entry present diminished in the right base, chest tube in place  on right chest ?Cardiovascular system: S1 & S2 +, No JVD,. ?Gastrointestinal system: Abdomen soft,NT,ND, BS+ ?Nervous System:Alert, awake, moving extremities and grossly nonfocal ?Extremities: edema neg,distal peripheral pulses palpable.  ?Skin: No rashes,no icterus. ?MSK: Normal muscle bulk,tone, power ? ? ? ?Medications reviewed: Scheduled Meds: ? dextromethorphan-guaiFENesin  1 tablet Oral BID  ? enoxaparin (LOVENOX) injection  40 mg Subcutaneous Q24H  ? insulin aspart  0-9 Units Subcutaneous TID WC  ? ipratropium-albuterol  3 mL Nebulization BID  ? lidocaine  1 patch Transdermal Q24H  ? metFORMIN  500 mg Oral Q breakfast  ? sodium chloride flush  10 mL Other Q8H  ? ?Continuous Infusions: ? azithromycin Stopped (01/16/22 1653)  ? cefTRIAXone (ROCEPHIN)  IV Stopped (01/16/22 1348)  ? ? ?  ?Diet Order   ? ?       ?  Diet Heart Room service appropriate? Yes; Fluid consistency: Thin  Diet effective now       ?  ? ?  ?  ? ?  ? ?Unresulted Labs (From admission, onward)  ? ?  Start     Ordered  ? 01/20/22 0500  Creatinine, serum  (enoxaparin (LOVENOX)    CrCl >/= 30 ml/min)  Weekly,   R     ?Comments: while on enoxaparin therapy ?  ? January 20, 2022 1832  ? 01/16/22 0500  CBC  Daily,   R     ?Question:   Specimen collection method  Answer:  Lab=Lab collect  ? 01/15/22 0827  ? 01/16/22 4742  Basic metabolic panel  Daily,   R     ?Question:  Specimen collection method  Answer:  Lab=Lab collect  ? 01/15/22 0827  ? 01/15/22 0954  Expectorated Sputum Assessment w Gram Stain, Rflx to Resp Cult  Once,   R       ? 01/15/22 0954  ? 01-20-2022 1828  Expectorated Sputum Assessment w Gram Stain, Rflx to [place] - STAT,   STAT       ? 2022/01/20 1832  ? ?  ?  ? ?  ?Data Reviewed: I have personally reviewed following labs and imaging studies ?CBC: ?Recent Labs  ?Lab 01-20-2022 ?1019 01/14/22 ?0446 01/15/22 ?5956 01/16/22 ?3875 01/17/22 ?0427  ?WBC 13.9* 10.5 20.9* 16.8* 14.8*  ?NEUTROABS 9.6* 8.7* 18.3*  --   --   ?  HGB 11.7* 11.8* 10.8* 10.6* 11.5*  ?HCT 35.6* 36.7 32.6* 33.1* 33.9*  ?MCV 85.4 88.4 86.5 88.7 85.0  ?PLT 365 316 363 375 392  ? ?Basic Metabolic Panel: ?Recent Labs  ?Lab 01/13/22 ?1033 01/14/22 ?0446 01/15/22 ?2952 01/16/22 ?8413 01/17/22 ?0427  ?NA 138 132* 135 135 132*  ?K 4.2 3.9 4.1 4.0 3.8  ?CL 104 101 104 103 102  ?CO2 23 21* '22 24 25  '$ ?GLUCOSE 145* 311* 173* 133* 134*  ?BUN '11 13 17 '$ 24* 17  ?CREATININE 0.76 0.77 0.71 0.87 0.72  ?CALCIUM 9.3 8.4* 8.8* 8.7* 8.0*  ?MG  --  2.0 2.0  --   --   ?PHOS  --  3.3 3.5  --   --   ? ?GFR: ?Estimated Creatinine Clearance: 41.8 mL/min (by C-G formula based on SCr of 0.72 mg/dL). ?Liver Function Tests: ?Recent Labs  ?Lab 01/13/22 ?1033 01/14/22 ?0446 01/15/22 ?2440 01/16/22 ?1509  ?AST '21 21 25  '$ --   ?ALT '28 28 30  '$ --   ?ALKPHOS 65 66 58  --   ?BILITOT 0.6 0.4 0.1*  --   ?PROT 7.0 6.8 6.4* 6.6  ?ALBUMIN 3.2* 2.8* 2.6*  --   ? ?No results for input(s): LIPASE, AMYLASE in the last 168 hours. ?No results for input(s): AMMONIA in the last 168 hours. ?Coagulation Profile: ?No results for input(s): INR, PROTIME in the last 168 hours. ?BNP (last 3 results) ?No results for input(s): PROBNP in the last 8760 hours. ?HbA1C: ?Recent Labs  ?  01/14/22 ?1316  ?HGBA1C 6.7*   ? ?CBG: ?Recent Labs  ?Lab 01/15/22 ?2343 01/16/22 ?1027 01/16/22 ?0806 01/16/22 ?1233 01/16/22 ?1757  ?GLUCAP 170* 138* 75 179* 92  ? ?Lipid Profile: ?No results for input(s): CHOL, HDL, LDLCALC, TRIG, CHOLHDL, LDLDIRECT in the

## 2022-01-17 NOTE — Procedures (Signed)
Pleural Fibrinolytic Administration Procedure Note ? ?Yolanda Burke  ?[id number]  ?[birth year] ? ?Date:01/17/22  ?Time:2:32 PM  ? ?Provider Performing:Dariusz Brase  ? ?Procedure: Pleural Fibrinolysis Subsequent day (87867) ? ?Indication(s) ?Fibrinolysis of complicated pleural effusion ? ?Consent ?Risks of the procedure as well as the alternatives and risks of each were explained to the patient and/or caregiver.  Consent for the procedure was obtained. ? ? ?Anesthesia ?None ? ? ?Time Out ?Verified patient identification, verified procedure, site/side was marked, verified correct patient position, special equipment/implants available, medications/allergies/relevant history reviewed, required imaging and test results available. ? ? ?Sterile Technique ?Hand hygiene, gloves ? ? ?Procedure Description ?Existing pleural catheter was cleaned and accessed in sterile manner.  '10mg'$  of tPA in 30cc of saline and '5mg'$  of dornase in 30cc of sterile water were injected into pleural space using existing pleural catheter.  Catheter will be clamped for 1 hour and then placed back to suction. ? ? ?Complications/Tolerance ?None; patient tolerated the procedure well. - except mild to moderate painduring injection ? ?EBL ?None ? ? ?Specimen(s) ?None ? ? ? ? ?SIGNATURE  ? ? ?Dr. Brand Males, M.D., F.C.C.P,  ?Pulmonary and Critical Care Medicine ?Staff Physician, [place] ?Center Director - Interstitial Lung Disease  Program  ?Pulmonary [place] [facility] ?[place], [place], [address] ? ?NPI Number:  NPI #[id number] ?DEA Number: [id number] ? ?Pager: [phone], If no answer  -> Check AMION or Try [phone] ?Telephone (clinical office): [phone] ?Telephone (research): [phone] ? ?2:33 PM ?01/17/2022 ? ? ?

## 2022-01-17 NOTE — Progress Notes (Signed)
Pt refused blood glucose checks at 0000 and 0400, stating she wanted to rest and not be be stuck in the middle of night. Rn will relay this message to oncoming day shift to communicate this concern with md. Pt remains stable. RN will continue to monitor.  ?

## 2022-01-18 ENCOUNTER — Inpatient Hospital Stay (HOSPITAL_COMMUNITY): Payer: Medicare Other

## 2022-01-18 DIAGNOSIS — J189 Pneumonia, unspecified organism: Secondary | ICD-10-CM | POA: Diagnosis not present

## 2022-01-18 DIAGNOSIS — E871 Hypo-osmolality and hyponatremia: Secondary | ICD-10-CM

## 2022-01-18 LAB — CULTURE, BLOOD (ROUTINE X 2)
Culture: NO GROWTH
Culture: NO GROWTH
Special Requests: ADEQUATE
Special Requests: ADEQUATE

## 2022-01-18 LAB — BASIC METABOLIC PANEL
Anion gap: 5 (ref 5–15)
BUN: 16 mg/dL (ref 8–23)
CO2: 23 mmol/L (ref 22–32)
Calcium: 7.8 mg/dL — ABNORMAL LOW (ref 8.9–10.3)
Chloride: 103 mmol/L (ref 98–111)
Creatinine, Ser: 0.65 mg/dL (ref 0.44–1.00)
GFR, Estimated: >60 mL/min (ref >60)
Glucose, Bld: 122 mg/dL — ABNORMAL HIGH (ref 70–99)
Potassium: 3.8 mmol/L (ref 3.5–5.1)
Sodium: 131 mmol/L — ABNORMAL LOW (ref 135–145)

## 2022-01-18 LAB — GLUCOSE, CAPILLARY
Glucose-Capillary: 155 mg/dL — ABNORMAL HIGH (ref 70–99)
Glucose-Capillary: 110 mg/dL — ABNORMAL HIGH (ref 70–99)
Glucose-Capillary: 179 mg/dL — ABNORMAL HIGH (ref 70–99)
Glucose-Capillary: 206 mg/dL — ABNORMAL HIGH (ref 70–99)
Glucose-Capillary: 178 mg/dL — ABNORMAL HIGH (ref 70–99)
Glucose-Capillary: 111 mg/dL — ABNORMAL HIGH (ref 70–99)
Glucose-Capillary: 102 mg/dL — ABNORMAL HIGH (ref 70–99)
Glucose-Capillary: 116 mg/dL — ABNORMAL HIGH (ref 70–99)

## 2022-01-18 LAB — CULTURE, RESPIRATORY W GRAM STAIN: Culture: NORMAL

## 2022-01-18 LAB — CBC
HCT: 36.4 % (ref 36.0–46.0)
Hemoglobin: 11.9 g/dL — ABNORMAL LOW (ref 12.0–15.0)
MCH: 28.3 pg (ref 26.0–34.0)
MCHC: 32.7 g/dL (ref 30.0–36.0)
MCV: 86.7 fL (ref 80.0–100.0)
Platelets: 400 10*3/uL (ref 150–400)
RBC: 4.2 MIL/uL (ref 3.87–5.11)
RDW: 13.2 % (ref 11.5–15.5)
WBC: 12.8 10*3/uL — ABNORMAL HIGH (ref 4.0–10.5)
nRBC: 0 % (ref 0.0–0.2)

## 2022-01-18 NOTE — Progress Notes (Addendum)
?PROGRESS NOTE ?Yolanda Burke  JSE:[id number] DOB: 28-Aug-1932 DOA: 01/13/2022 ?PCP: Virgie Dad, MD  ? ?Brief Narrative/Hospital Course: ?88 y.o. with history of chronic back pain, chronic cystitis with hematuria, diverticulitis, HTN, HLD, nephrolithiasis, cholelithiasis, macular degeneration, hypertensive retinopathy presented to [facility] w/ dyspnea. In ED  had elevated d-dimer. CTA chest was negative for clot, but did show multifocal PNA and a loculated pleural effusion. She was started on rocephin/zithro, placed on antibiotics bronchodilators steroids.  Patient reported that she had visited [place] last week and got sick, heat exhaustion there, was given IV fluids and oral antibiotics for 6 days prior to admission , she arrived to [place] and had completed antibiotics but not improved.  CT scan also showed a loculated pleural effusion and- got chest tube placed by pulmonary 3/23  ?  ?Subjective: ?Seen and examined this morning.  She reports feeling much improved no more pleuritic chest pain after Lytics. ?Chest x-ray pending -right-sided chest tube at 740 mL output past 24 hours-serosanguineous. ? ?Assessment and Plan: ?Principal Problem: ?  Multifocal pneumonia ?Active Problems: ?  GERD (gastroesophageal reflux disease) ?  Pure hypercholesterolemia ?  Hyperglycemia, drug-induced ?  Loculated pleural effusion ?  Hyponatremia ? ?Multifocal pneumonia ?Productive cough ?Loculated right pleural effusion-s/p right chest tube placement 3/23: ?Underwent lytics and chest tube 3/24, still having lot of output chest x-ray pending, continue chest tube management appreciate pulmonary input.  Pleural fluid culture no growth so far.  Leukocytosis downtrending, continue with ceftriaxone/azithromycin. ?Recent Labs  ?Lab 01/13/22 ?1033 01/14/22 ?0446 01/15/22 ?0737 01/15/22 ?0518 01/16/22 ?1062 01/17/22 ?0427 01/18/22 ?6948  ?WBC  --  10.5 20.9*  --  16.8* 14.8* 12.8*  ?PROCALCITON 0.32  --   --  <0.10 0.16 <0.10  --   ? ?Mild  hyponatremia monitor, in the setting of pulmonary disease ?Leukocytosis bump was after steroid, now downtrending.  Continue antibiotics as above ?GERD: Not on meds ?Pure hypercholesterolemia-NOT ON MEDS ?Hyperglycemia, drug-induced ?Type 2 diabetes mellitus new onset: ?A1c in 6.7 Diabetic range.  Added metformin low-dose, on sliding scale insulin blood sugar stable.   ?Recent Labs  ?Lab 01/14/22 ?1316 01/14/22 ?1614 01/17/22 ?0729 01/17/22 ?1157 01/17/22 ?1608 01/17/22 ?2003 01/18/22 ?0735  ?GLUCAP  --    < > 110* 179* 206* 178* 111*  ?HGBA1C 6.7*  --   --   --   --   --   --   ? < > = values in this interval not displayed.  ?   ?DVT prophylaxis: enoxaparin (LOVENOX) injection 40 mg Start: 01/13/22 2200 ?Code Status:   Code Status: DNR ?Family Communication: plan of care discussed with patient at bedside. ?Patient status is: Inpatient level of care: Med-Surg  ?Remains inpatient because: Ongoing management of pneumonia with IV antibiotics ?Patient currently not stable ? ?Dispo: The patient is from: Independent living facility ?           Anticipated disposition: To be decided, hopefully once pulmonary status is stable and once off chest tube ? ?Mobility Assessment (last 72 hours)   ? ? Mobility Assessment   ? ? [place] Name 01/17/22 2024 01/16/22 2044 01/16/22 2039 01/16/22 1000 01/15/22 1200  ? Does patient have an order for bedrest or is patient medically unstable No - Continue assessment No - Continue assessment No - Continue assessment No - Continue assessment No - Continue assessment  ? What is the highest level of mobility based on the progressive mobility assessment? Level 5 (Walks with assist in room/hall) - Balance while stepping  forward/back and can walk in room with assist - Complete  assist with chest tube Level 6 (Walks independently in room and hall) - Balance while walking in room without assist - Complete Level 6 (Walks independently in room and hall) - Balance while walking in room without assist -  Complete Level 6 (Walks independently in room and hall) - Balance while walking in room without assist - Complete Level 6 (Walks independently in room and hall) - Balance while walking in room without assist - Complete  ? ?  ?  ? ?  ?Objective: ?Vitals last 24 hrs: ?Vitals:  ? 01/17/22 0743 01/17/22 1339 01/17/22 1929 01/18/22 0459  ?BP:  (!) 152/58 (!) 161/66 125/60  ?Pulse:  84 88 80  ?Resp:  '18 18 16  '$ ?Temp:  99.9 ?F (37.7 ?C) 97.8 ?F (36.6 ?C) 98.4 ?F (36.9 ?C)  ?TempSrc:  Oral Oral Oral  ?SpO2: 96% 95% 95% 97%  ?Weight:      ?Height:      ? ?Weight change:  ? ?Physical Examination: ?General exam: AA OX3, very pleasant, not in distress,weak appearing. ?HEENT:Oral mucosa moist, Ear/Nose WNL grossly, dentition normal. ?Respiratory system: bilaterally air entry + rt post chest with chest tube in place. ?Cardiovascular system: S1 & S2 +, No JVD,. ?Gastrointestinal system: Abdomen soft,NT,ND, BS+ ?Nervous System:Alert, awake, moving extremities and grossly nonfocal ?Extremities: edema neg,distal peripheral pulses palpable.  ?Skin: No rashes,no icterus. ?MSK: Normal muscle bulk,tone, power ? ?Medications reviewed: Scheduled Meds: ? dextromethorphan-guaiFENesin  1 tablet Oral BID  ? enoxaparin (LOVENOX) injection  40 mg Subcutaneous Q24H  ? insulin aspart  0-9 Units Subcutaneous TID WC  ? lidocaine  1 patch Transdermal Q24H  ? metFORMIN  500 mg Oral Q breakfast  ? sodium chloride flush  10 mL Other Q8H  ? sodium chloride flush  10 mL Other Q8H  ? ?Continuous Infusions: ? azithromycin 500 mg (01/17/22 1449)  ? cefTRIAXone (ROCEPHIN)  IV 2 g (01/17/22 1300)  ? ? ?  ?Diet Order   ? ?       ?  Diet Heart Room service appropriate? Yes; Fluid consistency: Thin  Diet effective now       ?  ? ?  ?  ? ?  ? ?Unresulted Labs (From admission, onward)  ? ?  Start     Ordered  ? 01/20/22 0500  Creatinine, serum  (enoxaparin (LOVENOX)    CrCl >/= 30 ml/min)  Weekly,   R     ?Comments: while on enoxaparin therapy ?  ? 01/13/22 1832  ?  01/19/22 8786  Basic metabolic panel  Daily,   R     ?Question:  Specimen collection method  Answer:  Lab=Lab collect  ? 01/18/22 0757  ? 01/19/22 0500  CBC  Daily,   R     ?Question:  Specimen collection method  Answer:  Lab=Lab collect  ? 01/18/22 0757  ? 01/17/22 1322  Strep pneumoniae urinary antigen  Once,   R       ? 01/17/22 1321  ? 01/17/22 1322  Legionella Pneumophila Serogp 1 Ur Ag  Once,   R       ? 01/17/22 1321  ? 01/15/22 0954  Expectorated Sputum Assessment w Gram Stain, Rflx to Resp Cult  Once,   R       ? 01/15/22 0954  ? 01/13/22 1828  Expectorated Sputum Assessment w Gram Stain, Rflx to Resp Cult  ONCE - STAT,   STAT       ?  2022/01/19 1832  ? ?  ?  ? ?  ?Data Reviewed: I have personally reviewed following labs and imaging studies ?CBC: ?Recent Labs  ?Lab 01-19-22 ?1019 01/14/22 ?0446 01/15/22 ?7322 01/16/22 ?0254 01/17/22 ?0427 01/18/22 ?2706  ?WBC 13.9* 10.5 20.9* 16.8* 14.8* 12.8*  ?NEUTROABS 9.6* 8.7* 18.3*  --   --   --   ?HGB 11.7* 11.8* 10.8* 10.6* 11.5* 11.9*  ?HCT 35.6* 36.7 32.6* 33.1* 33.9* 36.4  ?MCV 85.4 88.4 86.5 88.7 85.0 86.7  ?PLT 365 316 363 375 392 400  ? ?Basic Metabolic Panel: ?Recent Labs  ?Lab 01/14/22 ?0446 01/15/22 ?2376 01/16/22 ?2831 01/17/22 ?0427 01/18/22 ?5176  ?NA 132* 135 135 132* 131*  ?K 3.9 4.1 4.0 3.8 3.8  ?CL 101 104 103 102 103  ?CO2 21* '22 24 25 23  '$ ?GLUCOSE 311* 173* 133* 134* 122*  ?BUN 13 17 24* 17 16  ?CREATININE 0.77 0.71 0.87 0.72 0.65  ?CALCIUM 8.4* 8.8* 8.7* 8.0* 7.8*  ?MG 2.0 2.0  --   --   --   ?PHOS 3.3 3.5  --   --   --   ? ?GFR: ?Estimated Creatinine Clearance: 41.8 mL/min (by C-G formula based on SCr of 0.65 mg/dL). ?Liver Function Tests: ?Recent Labs  ?Lab January 19, 2022 ?1033 01/14/22 ?0446 01/15/22 ?1607 01/16/22 ?1509  ?AST '21 21 25  '$ --   ?ALT '28 28 30  '$ --   ?ALKPHOS 65 66 58  --   ?BILITOT 0.6 0.4 0.1*  --   ?PROT 7.0 6.8 6.4* 6.6  ?ALBUMIN 3.2* 2.8* 2.6*  --   ? ?No results for input(s): LIPASE, AMYLASE in the last 168 hours. ?No results for  input(s): AMMONIA in the last 168 hours. ?Coagulation Profile: ?No results for input(s): INR, PROTIME in the last 168 hours. ?BNP (last 3 results) ?No results for input(s): PROBNP in the last 8760 hours. ?HbA1C: ?N

## 2022-01-18 NOTE — Progress Notes (Signed)
? ?NAME:  Anisa Leanos, MRN:  [id number], DOB:  1932-07-11, LOS: 5 ?ADMISSION DATE:  01/13/2022, CONSULTATION DATE:  3/23 ?REFERRING MD:  Lupita Leash, CHIEF COMPLAINT:  loculated effusion   ? ?History of Present Illness:  ?88 year old female who was admitted on 3/20 with chief complaint of shortness of breath.  She had recently returned from a trip in [place].  During that time she was actually hospitalized for dehydration and possible pneumonia treated with IV hydration and antibiotics.  Since she returned home she has continued to have ongoing right-sided pleuritic chest discomfort, persistent productive cough of yellow/clear sputum, and persistent shortness of breath and therefore sought evaluation in the emergency room.  On evaluation initial chest x-ray suggested right lower lobe pneumonia and possible loculated right effusion a CT chest was obtained to further evaluate these findings.  It was negative for pulmonary emboli, it did show right middle and right lower lobe airspace disease as well as multiloculated right pleural effusion.  She was admitted to the hospital, IV hydration therapeutic interventions to date have included, and empiric antibiotics she was started on azithromycin and ceftriaxone on 3/20.  A follow-up chest x-ray was obtained on 3/23 at which time right lower lobe consolidation improved a little, however still had significant area of right-sided airspace disease and right pleural effusion for which pulmonary was consulted. ? ?Pertinent  Medical History  ? ?Hypertension, hyperlipidemia, nephrolithiasis, cholelithiasis, macular degeneration, hypertensive retinopathy, chronic back pain. ? ?Significant Hospital Events: ?Including procedures, antibiotic start and stop dates in addition to other pertinent events   ?3/20 admitted with working diagnosis of community-acquired pneumonia plus pleural effusion.  Started on azithromycin and ceftriaxone ?3/23 pulmonary asked to evaluate for multiloculated right  pleural effusion: eval c/ w exudate  ?Pleural fluid LDH 174 ?Cytology nondiagnostic. ?3/24- No distress this morning feels much better.  Pleuritic chest pain will managed with Toradol, pleural output evaluated, she has put out about 900 mL since chest tube placed ?Status post second dose intrapleural fibrinolytic ? ?Interim History / Subjective:  ? ?01/18/2022: Room air.  Walking 100 feet.  Able to go to the bathroom by herself.  Still has right wean chest tube.  After intrapleural fibrinolytics put out to 60 cc according to the nurse.  Overnight put out for 80 cc.  All day today 100 cc.  Slightly hemorrhagic fluid.  She tells me she does not want to do intrapleural lytics anymore.  She is happy with the status quo.  She is quite keen to go home.  She is reading book and watching TV.  No growth on pleural fluid culture.  No malignant cells on cytology0.  Chest x-ray today personally visualized with significant improvement but similar to yesterday.  Not much of residual fluid that may be in the right costophrenic recess. ? ?Objective   ?Blood pressure (!) 129/52, pulse 85, temperature 98 ?F (36.7 ?C), temperature source Oral, resp. rate 18, height '5\' 2"'$  (1.575 m), weight 61.2 kg, SpO2 95 %. ?   ?   ? ?Intake/Output Summary (Last 24 hours) at 01/18/2022 1705 ?Last data filed at 01/18/2022 1305 ?Gross per 24 hour  ?Intake 170 ml  ?Output 740 ml  ?Net -570 ml  ? ?Filed Weights  ? 01/13/22 1012  ?Weight: 61.2 kg  ? ? ?Examination: ?Pleasant female.  Thin.  Has glasses on.  Right nostril with few blister.  Right-sided chest tube present diminished air entry on the right side but overall clear to auscultation.  Abdomen soft normal heart  sounds.  No cyanosis no clubbing no edema. ? ? ?Resolved Hospital Problem list   ? ? ?Assessment & Plan:  ?Principal Problem: ?  Multifocal pneumonia ?Active Problems: ?  GERD (gastroesophageal reflux disease) ?  Pure hypercholesterolemia ?  Hyperglycemia, drug-induced ?  Loculated pleural  effusion ?  Hyponatremia ? ? ?Community-acquired pneumonia with associated multiloculated exudative pleural effusion. ? ?-01/18/2022: Status post 2 dose of intrapleural fibrinolytics with significant improvement in chest x-ray but still with some residual probably right recess costophrenic fluid collection.  Doing well.  Drainage improving. ?Plan ?Antibiotics as below. ?Refused third dose intrapleural lytics ?Continue chest tube to suction  ?Continue Chest tube maintenance -remove in total drainage is  less than 100 cc in 24 hours ? ?Possible right upper lobe lung nodule ?Plan ?Follow-up chest x-ray, if not clearly will eventually need follow-up CT scan ? ? ? ? ? ?SIGNATURE  ? ? ?Dr. Brand Males, M.D., F.C.C.P,  ?Pulmonary and Critical Care Medicine ?Staff Physician, [place] ?Center Director - Interstitial Lung Disease  Program  ?Pulmonary [facility] ?[place], [place], [address] ? ?NPI Number:  NPI #[id number] ?DEA Number: [id number] ? ?Pager: [phone], If no answer  -> Check AMION or Try [phone] ?Telephone (clinical office): [phone] ?Telephone (research): [phone] ? ?5:10 PM ?01/18/2022 ? ?

## 2022-01-19 DIAGNOSIS — J189 Pneumonia, unspecified organism: Secondary | ICD-10-CM | POA: Diagnosis not present

## 2022-01-19 LAB — CBC
HCT: 32.1 % — ABNORMAL LOW (ref 36.0–46.0)
Hemoglobin: 10.6 g/dL — ABNORMAL LOW (ref 12.0–15.0)
MCH: 28.6 pg (ref 26.0–34.0)
MCHC: 33 g/dL (ref 30.0–36.0)
MCV: 86.5 fL (ref 80.0–100.0)
Platelets: 357 10*3/uL (ref 150–400)
RBC: 3.71 MIL/uL — ABNORMAL LOW (ref 3.87–5.11)
RDW: 13.2 % (ref 11.5–15.5)
WBC: 10.6 10*3/uL — ABNORMAL HIGH (ref 4.0–10.5)
nRBC: 0 % (ref 0.0–0.2)

## 2022-01-19 LAB — BASIC METABOLIC PANEL
Anion gap: 7 (ref 5–15)
BUN: 20 mg/dL (ref 8–23)
CO2: 23 mmol/L (ref 22–32)
Calcium: 7.8 mg/dL — ABNORMAL LOW (ref 8.9–10.3)
Chloride: 103 mmol/L (ref 98–111)
Creatinine, Ser: 0.67 mg/dL (ref 0.44–1.00)
GFR, Estimated: >60 mL/min (ref >60)
Glucose, Bld: 205 mg/dL — ABNORMAL HIGH (ref 70–99)
Potassium: 3.8 mmol/L (ref 3.5–5.1)
Sodium: 133 mmol/L — ABNORMAL LOW (ref 135–145)

## 2022-01-19 LAB — GLUCOSE, CAPILLARY
Glucose-Capillary: 105 mg/dL — ABNORMAL HIGH (ref 70–99)
Glucose-Capillary: 91 mg/dL (ref 70–99)
Glucose-Capillary: 134 mg/dL — ABNORMAL HIGH (ref 70–99)

## 2022-01-19 MED ORDER — SODIUM CHLORIDE 0.9 % IV SOLN
2.0000 g | INTRAVENOUS | Status: DC
  Administered 2022-01-19: 2 g via INTRAVENOUS
  Filled 2022-01-19: qty 20

## 2022-01-19 MED ORDER — ALUM & MAG HYDROXIDE-SIMETH 200-200-20 MG/5ML PO SUSP
30.0000 mL | ORAL | Status: DC | PRN
  Administered 2022-01-19: 30 mL via ORAL
  Filled 2022-01-19: qty 30

## 2022-01-19 NOTE — Progress Notes (Signed)
? ?NAME:  Yolanda Burke, MRN:  [id number], DOB:  11-Mar-1932, LOS: 6 ?ADMISSION DATE:  01/13/2022, CONSULTATION DATE:  3/23 ?REFERRING MD:  Lupita Leash, CHIEF COMPLAINT:  loculated effusion   ? ?History of Present Illness:  ?88 year old female who was admitted on 3/20 with chief complaint of shortness of breath.  She had recently returned from a trip in [place].  During that time she was actually hospitalized for dehydration and possible pneumonia treated with IV hydration and antibiotics.  Since she returned home she has continued to have ongoing right-sided pleuritic chest discomfort, persistent productive cough of yellow/clear sputum, and persistent shortness of breath and therefore sought evaluation in the emergency room.  On evaluation initial chest x-ray suggested right lower lobe pneumonia and possible loculated right effusion a CT chest was obtained to further evaluate these findings.  It was negative for pulmonary emboli, it did show right middle and right lower lobe airspace disease as well as multiloculated right pleural effusion.  She was admitted to the hospital, IV hydration therapeutic interventions to date have included, and empiric antibiotics she was started on azithromycin and ceftriaxone on 3/20.  A follow-up chest x-ray was obtained on 3/23 at which time right lower lobe consolidation improved a little, however still had significant area of right-sided airspace disease and right pleural effusion for which pulmonary was consulted. ? ?Pertinent  Medical History  ? ?Hypertension, hyperlipidemia, nephrolithiasis, cholelithiasis, macular degeneration, hypertensive retinopathy, chronic back pain. ? ?Significant Hospital Events: ?Including procedures, antibiotic start and stop dates in addition to other pertinent events   ?3/20 admitted with working diagnosis of community-acquired pneumonia plus pleural effusion.  Started on azithromycin and ceftriaxone ?3/23 pulmonary asked to evaluate for multiloculated right  pleural effusion: eval c/ w exudate  ?Pleural fluid LDH 174 ?Cytology nondiagnostic. ?3/24- No distress this morning feels much better.  Pleuritic chest pain will managed with Toradol, pleural output evaluated, she has put out about 900 mL since chest tube placed ?Status post second dose intrapleural fibrinolytic ?01/18/2022: Room air.  Walking 100 feet.  Able to go to the bathroom by herself.  Still has right wean chest tube.  After intrapleural fibrinolytics put out to 60 cc according to the nurse.  Overnight put out for 80 cc.  All day today 100 cc.  Slightly hemorrhagic fluid.  She tells me she does not want to do intrapleural lytics anymore.  She is happy with the status quo.  She is quite keen to go home.  She is reading book and watching TV.  No growth on pleural fluid culture.  No malignant cells on cytology0.  Chest x-ray today personally visualized with significant improvement but similar to yesterday.  Not much of residual fluid that may be in the right costophrenic recess ? ?Interim History / Subjective:  ? ?3/26 - doing well. Walked. Wants to go home. 45cc RT chest tube drain sinoce 7am - 4pm ? ?Objective   ?Blood pressure (!) 145/64, pulse 75, temperature 98.4 ?F (36.9 ?C), temperature source Oral, resp. rate 20, height '5\' 2"'$  (1.575 m), weight 61.2 kg, SpO2 97 %. ?   ?   ? ?Intake/Output Summary (Last 24 hours) at 01/19/2022 1917 ?Last data filed at 01/19/2022 1519 ?Gross per 24 hour  ?Intake 1210 ml  ?Output 25 ml  ?Net 1185 ml  ? ?Filed Weights  ? 01/13/22 1012  ?Weight: 61.2 kg  ? ? ?Examination: ?General Appearance:  Looks well ?Head:  Normocephalic, without obvious abnormality, atraumatic ?Eyes:  PERRL - yes, conjunctiva/corneas -  muddy     ?Ears:  Normal external ear canals, both ears ?Nose:  G tube - no. HAs RT nostril fever blister. Improving ?Throat:  ETT TUBE - NO ?Neck:  Supple,  No enlargement/tenderness/nodules ?Lungs: Clear to auscultation bilaterally anteriorly ?Heart:  S1 and S2 normal, no  murmur, CVP - no.  Pressors - no ?Abdomen:  Soft, no masses, no organomegaly ?Genitalia / Rectal:  Not done ?Extremities:  Extremities- intact ?Skin:  ntact in exposed areas . Sacral area - not examined ?Neurologic:  Sedation - none -> RASS - +1 . Moves all 4s - yes. CAM-ICU - neg . Orientation - x3+ ? ? ? ? ? ?Resolved Hospital Problem list   ? ? ?Assessment & Plan:  ?Principal Problem: ?  Multifocal pneumonia ?Active Problems: ?  GERD (gastroesophageal reflux disease) ?  Pure hypercholesterolemia ?  Hyperglycemia, drug-induced ?  Loculated pleural effusion ?  Hyponatremia ? ? ?Community-acquired pneumonia with associated multiloculated exudative pleural effusion. ? ?-01/19/2022: Status post 2 dose of intrapleural fibrinolytics - last dose 01/17/22. Sign ignificant improvement in chest x-ray  01/18/22 but still with some residual probably right recess costophrenic fluid collection.  Doing well.  Drainage improving - down to 45cc on 01/19/22 ? ?- refused 3rd dose lytics ? ?Plan ?Antibiotics as below. ?Continue chest tube to suction  ?Continue Chest tube maintenance ? -remove in total drainage is  less than 100 cc in 24 hours - anticipate this 01/20/22 and then home ? ?Possible right upper lobe lung nodule ?Plan ?Follow-up chest x-ray, if not clearly will eventually need follow-up CT scan ? ? ?ANTIBIOTICS ?Anti-infectives (From admission, onward)  ? ? Start     Dose/Rate Route Frequency Ordered Stop  ? 01/19/22 1200  cefTRIAXone (ROCEPHIN) 2 g in sodium chloride 0.9 % 100 mL IVPB       ? 2 g ?200 mL/hr over 30 Minutes Intravenous Every 24 hours 01/19/22 1123 01/22/22 1159  ? 01/14/22 1400  azithromycin (ZITHROMAX) 500 mg in sodium chloride 0.9 % 250 mL IVPB       ? 500 mg ?250 mL/hr over 60 Minutes Intravenous Every 24 hours 01/13/22 1832 01/18/22 1405  ? 01/14/22 1200  cefTRIAXone (ROCEPHIN) 2 g in sodium chloride 0.9 % 100 mL IVPB       ? 2 g ?200 mL/hr over 30 Minutes Intravenous Every 24 hours 01/13/22 1832 01/18/22  1157  ? 01/13/22 1215  cefTRIAXone (ROCEPHIN) 1 g in sodium chloride 0.9 % 100 mL IVPB       ? 1 g ?200 mL/hr over 30 Minutes Intravenous  Once 01/13/22 1212 01/13/22 1348  ? 01/13/22 1215  azithromycin (ZITHROMAX) 500 mg in sodium chloride 0.9 % 250 mL IVPB       ? 500 mg ?250 mL/hr over 60 Minutes Intravenous  Once 01/13/22 1212 01/13/22 1527  ? ?  ? ? ?FOLLOWUP ?Followup with one of the pleural pulmonary docs at [facility] ? ?Future Appointments  ?Date Time Provider [place]  ?03/19/2022 10:00 AM Virgie Dad, MD [facility] None  ?06/18/2022  9:15 AM Bernarda Caffey, MD [facility] None  ? ? ? ? ?SIGNATURE  ? ? ?Dr. Brand Males, M.D., F.C.C.P,  ?Pulmonary and Critical Care Medicine ?Staff Physician, [place] ?Center Director - Interstitial Lung Disease  Program  ?Pulmonary [place] [facility] ?[place], [place], [address] ? ?NPI Number:  NPI #[id number] ?DEA Number: [id number] ? ?Pager: [phone], If no answer  -> Check  AMION or Try [phone] [id number] ?Telephone (clinical office): [phone] ?Telephone (research): [phone] ? ?7:17 PM ?01/19/2022 ? ?

## 2022-01-19 NOTE — Progress Notes (Signed)
?PROGRESS NOTE ?Yolanda Burke  YJE:[id number] DOB: [birth year] DOA: 01/13/2022 ?PCP: Virgie Dad, MD  ? ?Brief Narrative/Hospital Course: ?88 y.o. with history of chronic back pain, chronic cystitis with hematuria, diverticulitis, HTN, HLD, nephrolithiasis, cholelithiasis, macular degeneration, hypertensive retinopathy presented to [facility] w/ dyspnea. In ED  had elevated d-dimer. CTA chest was negative for clot, but did show multifocal PNA and a loculated pleural effusion. She was started on rocephin/zithro, placed on antibiotics bronchodilators steroids.  Patient reported that she had visited [place] last week and got sick, heat exhaustion there, was given IV fluids and oral antibiotics for 6 days prior to admission , she arrived to [place] and had completed antibiotics but not improved.  CT scan also showed a loculated pleural effusion and- got chest tube placed by pulmonary 3/23  ?  ?Subjective: ?Seen examined this morning.  Ambulating in the hallway able to take deep breath had some chest pain at the chest tube site at times.   ?Overnight afebrile no new complaints.   ? ?Assessment and Plan: ?Principal Problem: ?  Multifocal pneumonia ?Active Problems: ?  GERD (gastroesophageal reflux disease) ?  Pure hypercholesterolemia ?  Hyperglycemia, drug-induced ?  Loculated pleural effusion ?  Hyponatremia ? ?Multifocal pneumonia ?Productive cough ?Loculated right pleural effusion-s/p right chest tube placement 3/23: ?Underwent lytics and chest tube 3/24, chest tube output slowing down at 125 cc, continue to suction as per PCCM will plan to remove it once less than 100 cc.  Not needing oxygen overall clinically improving. cONT PER pccm.Pleural fluid culture no growth so far.  Leukocytosis downtrending, continue with ceftriaxone for few more days, completed 5 days of azithromycin. ?Recent Labs  ?Lab 01/13/22 ?1033 01/14/22 ?0446 01/15/22 ?6378 01/15/22 ?0518 01/16/22 ?5885 01/17/22 ?0427 01/18/22 ?0277 01/19/22 ?0455  ?WBC   --    < > 20.9*  --  16.8* 14.8* 12.8* 10.6*  ?PROCALCITON 0.32  --   --  <0.10 0.16 <0.10  --   --   ? < > = values in this interval not displayed.  ? ? ?Mild hyponatremia monitor, in the setting of pulmonary disease.  Stable ?Leukocytosis bump was after steroid,  improving ?GERD: Not on meds ?Pure hypercholesterolemia-NOT ON MEDS ?Hyperglycemia, drug-induced ?Type 2 diabetes mellitus new onset: ?A1c in 6.7 Diabetic range.  Added metformin low-dose, continue sliding scale insulin blood sugar stable.   ?Recent Labs  ?Lab 01/14/22 ?1316 01/14/22 ?1614 01/17/22 ?2003 01/18/22 ?0735 01/18/22 ?1121 01/18/22 ?1634 01/19/22 ?4128  ?GLUCAP  --    < > 178* 111* 102* 116* 105*  ?HGBA1C 6.7*  --   --   --   --   --   --   ? < > = values in this interval not displayed.  ? ?   ?DVT prophylaxis: enoxaparin (LOVENOX) injection 40 mg Start: 01/13/22 2200 ?Code Status:   Code Status: DNR ?Family Communication: plan of care discussed with patient at bedside. ?Patient status is: Inpatient level of care: Med-Surg  ?Remains inpatient because: Ongoing management of pneumonia with IV antibiotics ?Patient currently not stable ? ?Dispo: The patient is from: Independent living facility ?           Anticipated disposition: To be decided, hopefully once pulmonary status is stable and once off chest tube ? ?Mobility Assessment (last 72 hours)   ? ? Mobility Assessment   ? ? [place] Name 01/18/22 0730 01/17/22 2024 01/16/22 2044 01/16/22 2039  ?  ? Does patient have an order for bedrest or is patient  medically unstable No - Continue assessment No - Continue assessment No - Continue assessment No - Continue assessment   ? What is the highest level of mobility based on the progressive mobility assessment? Level 5 (Walks with assist in room/hall) - Balance while stepping forward/back and can walk in room with assist - Complete  due to chest tube and IV Level 5 (Walks with assist in room/hall) - Balance while stepping forward/back and can walk in room  with assist - Complete  assist with chest tube Level 6 (Walks independently in room and hall) - Balance while walking in room without assist - Complete Level 6 (Walks independently in room and hall) - Balance while walking in room without assist - Complete   ? ?  ?  ? ?  ?Objective: ?Vitals last 24 hrs: ?Vitals:  ? 01/18/22 0459 01/18/22 1403 01/18/22 2012 01/19/22 0321  ?BP: 125/60 (!) 129/52 (!) 117/54 (!) 145/64  ?Pulse: 80 85 87 75  ?Resp: '16 18 20 20  '$ ?Temp: 98.4 ?F (36.9 ?C) 98 ?F (36.7 ?C) 98.2 ?F (36.8 ?C) 98.4 ?F (36.9 ?C)  ?TempSrc: Oral Oral Oral Oral  ?SpO2: 97% 95% 94% 97%  ?Weight:      ?Height:      ? ?Weight change:  ? ?Physical Examination: ?General exam: AA OX3, pleasant,older than stated age, weak appearing. ?HEENT:Oral mucosa moist, Ear/Nose WNL grossly, dentition normal. ?Respiratory system: bilaterally clear with chest tube on the right chest wall  ?Cardiovascular system: S1 & S2 +, No JVD,. ?Gastrointestinal system: Abdomen soft,NT,ND, BS+ ?Nervous System:Alert, awake, moving extremities and grossly nonfocal ?Extremities: edema neg,distal peripheral pulses palpable.  ?Skin: No rashes,no icterus. ?MSK: Normal muscle bulk,tone, power ? ? ?Medications reviewed: Scheduled Meds: ? dextromethorphan-guaiFENesin  1 tablet Oral BID  ? enoxaparin (LOVENOX) injection  40 mg Subcutaneous Q24H  ? insulin aspart  0-9 Units Subcutaneous TID WC  ? lidocaine  1 patch Transdermal Q24H  ? metFORMIN  500 mg Oral Q breakfast  ? sodium chloride flush  10 mL Other Q8H  ? sodium chloride flush  10 mL Other Q8H  ? ?Continuous Infusions: ? ? ? ?  ?Diet Order   ? ?       ?  Diet Heart Room service appropriate? Yes; Fluid consistency: Thin  Diet effective now       ?  ? ?  ?  ? ?  ? ?Unresulted Labs (From admission, onward)  ? ?  Start     Ordered  ? 01/20/22 0500  Creatinine, serum  (enoxaparin (LOVENOX)    CrCl >/= 30 ml/min)  Weekly,   R     ?Comments: while on enoxaparin therapy ?  ? 01/13/22 1832  ? 01/19/22 0254   Basic metabolic panel  Daily,   R     ?Question:  Specimen collection method  Answer:  Lab=Lab collect  ? 01/18/22 0757  ? 01/19/22 0500  CBC  Daily,   R     ?Question:  Specimen collection method  Answer:  Lab=Lab collect  ? 01/18/22 0757  ? 01/17/22 1322  Strep pneumoniae urinary antigen  Once,   R       ? 01/17/22 1321  ? 01/17/22 1322  Legionella Pneumophila Serogp 1 Ur Ag  Once,   R       ? 01/17/22 1321  ? 01/15/22 0954  Expectorated Sputum Assessment w Gram Stain, Rflx to Resp Cult  Once,   R       ?  01/15/22 0954  ? 01-17-2022 1828  Expectorated Sputum Assessment w Gram Stain, Rflx to [place] - STAT,   STAT       ? 01/17/22 1832  ? ?  ?  ? ?  ?Data Reviewed: I have personally reviewed following labs and imaging studies ?CBC: ?Recent Labs  ?Lab 17-Jan-2022 ?1019 01/14/22 ?0446 01/15/22 ?0160 01/16/22 ?1093 01/17/22 ?0427 01/18/22 ?2355 01/19/22 ?0455  ?WBC 13.9* 10.5 20.9* 16.8* 14.8* 12.8* 10.6*  ?NEUTROABS 9.6* 8.7* 18.3*  --   --   --   --   ?HGB 11.7* 11.8* 10.8* 10.6* 11.5* 11.9* 10.6*  ?HCT 35.6* 36.7 32.6* 33.1* 33.9* 36.4 32.1*  ?MCV 85.4 88.4 86.5 88.7 85.0 86.7 86.5  ?PLT 365 316 363 375 392 400 357  ? ? ?Basic Metabolic Panel: ?Recent Labs  ?Lab 01/14/22 ?0446 01/15/22 ?7322 01/16/22 ?0254 01/17/22 ?0427 01/18/22 ?2706 01/19/22 ?0455  ?NA 132* 135 135 132* 131* 133*  ?K 3.9 4.1 4.0 3.8 3.8 3.8  ?CL 101 104 103 102 103 103  ?CO2 21* '22 24 25 23 23  '$ ?GLUCOSE 311* 173* 133* 134* 122* 205*  ?BUN 13 17 24* '17 16 20  '$ ?CREATININE 0.77 0.71 0.87 0.72 0.65 0.67  ?CALCIUM 8.4* 8.8* 8.7* 8.0* 7.8* 7.8*  ?MG 2.0 2.0  --   --   --   --   ?PHOS 3.3 3.5  --   --   --   --   ? ? ?GFR: ?Estimated Creatinine Clearance: 41.8 mL/min (by C-G formula based on SCr of 0.67 mg/dL). ?Liver Function Tests: ?Recent Labs  ?Lab 17-Jan-2022 ?1033 01/14/22 ?0446 01/15/22 ?2376 01/16/22 ?1509  ?AST '21 21 25  '$ --   ?ALT '28 28 30  '$ --   ?ALKPHOS 65 66 58  --   ?BILITOT 0.6 0.4 0.1*  --   ?PROT 7.0 6.8 6.4* 6.6  ?ALBUMIN 3.2* 2.8* 2.6*   --   ? ? ?No results for input(s): LIPASE, AMYLASE in the last 168 hours. ?No results for input(s): AMMONIA in the last 168 hours. ?Coagulation Profile: ?No results for input(s): INR, PROTIME in the

## 2022-01-20 ENCOUNTER — Inpatient Hospital Stay (HOSPITAL_COMMUNITY): Payer: Medicare Other

## 2022-01-20 ENCOUNTER — Telehealth: Payer: Self-pay | Admitting: Pulmonary Disease

## 2022-01-20 DIAGNOSIS — J189 Pneumonia, unspecified organism: Secondary | ICD-10-CM | POA: Diagnosis not present

## 2022-01-20 LAB — BASIC METABOLIC PANEL
Anion gap: 7 (ref 5–15)
BUN: 20 mg/dL (ref 8–23)
CO2: 24 mmol/L (ref 22–32)
Calcium: 8.1 mg/dL — ABNORMAL LOW (ref 8.9–10.3)
Chloride: 103 mmol/L (ref 98–111)
Creatinine, Ser: 0.69 mg/dL (ref 0.44–1.00)
GFR, Estimated: >60 mL/min (ref >60)
Glucose, Bld: 107 mg/dL — ABNORMAL HIGH (ref 70–99)
Potassium: 4.1 mmol/L (ref 3.5–5.1)
Sodium: 134 mmol/L — ABNORMAL LOW (ref 135–145)

## 2022-01-20 LAB — CBC
HCT: 32.1 % — ABNORMAL LOW (ref 36.0–46.0)
Hemoglobin: 10.7 g/dL — ABNORMAL LOW (ref 12.0–15.0)
MCH: 28.2 pg (ref 26.0–34.0)
MCHC: 33.3 g/dL (ref 30.0–36.0)
MCV: 84.7 fL (ref 80.0–100.0)
Platelets: 448 10*3/uL — ABNORMAL HIGH (ref 150–400)
RBC: 3.79 MIL/uL — ABNORMAL LOW (ref 3.87–5.11)
RDW: 13.1 % (ref 11.5–15.5)
WBC: 9.7 10*3/uL (ref 4.0–10.5)
nRBC: 0 % (ref 0.0–0.2)

## 2022-01-20 LAB — GLUCOSE, CAPILLARY
Glucose-Capillary: 93 mg/dL (ref 70–99)
Glucose-Capillary: 85 mg/dL (ref 70–99)

## 2022-01-20 LAB — BODY FLUID CULTURE W GRAM STAIN
Culture: NO GROWTH
Gram Stain: NONE SEEN

## 2022-01-20 MED ORDER — BLOOD GLUCOSE MONITOR KIT: PACK | 0 refills | Status: DC

## 2022-01-20 MED ORDER — DM-GUAIFENESIN ER 30-600 MG PO TB12: 1.0000 | ORAL_TABLET | Freq: Two times a day (BID) | ORAL | 0 refills | Status: AC

## 2022-01-20 MED ORDER — LIDOCAINE 5 % EX PTCH: 1.0000 | MEDICATED_PATCH | CUTANEOUS | 0 refills | Status: AC

## 2022-01-20 MED ORDER — METFORMIN HCL 500 MG PO TABS
500.0000 mg | ORAL_TABLET | Freq: Every day | ORAL | 0 refills | Status: DC
Start: 2022-01-20 — End: 2022-02-05

## 2022-01-20 NOTE — Progress Notes (Signed)
Mobility Specialist - Progress Note ? ? ? 01/20/22 1206  ?Mobility  ?Activity Ambulated independently to bathroom;Ambulated independently in room  ?Level of Assistance Modified independent, requires aide device or extra time  ?Assistive Device None  ?Distance Ambulated (ft) 15 ft  ?Activity Response Tolerated well  ?$Mobility charge 1 Mobility  ? ?Pt agreeable to mobilize in room upon entry. Ambulated to bathroom to "freshen up" and then requested for assistance to get dressed in preparation for d/c. Assisted pt with dressing. Pt requested to return to bed once done and is awaiting for family and d/c. RN notified.  ? ?Rakia Frayne S. ?Mobility Specialist ?Acute [facility] Services ?Phone: [phone] ?01/20/22, 12:08 PM ? ? ? ? ?

## 2022-01-20 NOTE — Progress Notes (Signed)
[facility] ? ?88 y/o female admitted with CAP and associated complicated parapneumonic effusion requiring chest tube placement.   ? ?S: feels well, no dyspnea, wants to go home ? ?O: ? ?Vitals:  ? 01/18/22 2012 01/19/22 0321 01/19/22 1950 01/20/22 0435  ?BP: (!) 117/54 (!) 145/64 (!) 133/55 (!) 144/57  ?Pulse: 87 75 81 68  ?Resp: '20 20 20 20  '$ ?Temp: 98.2 ?F (36.8 ?C) 98.4 ?F (36.9 ?C) 98.5 ?F (36.9 ?C) 97.7 ?F (36.5 ?C)  ?TempSrc: Oral Oral Oral Oral  ?SpO2: 94% 97% 97% 96%  ?Weight:      ?Height:      ? ?RA ? ?General:  Resting comfortably in bed ?HENT: NCAT OP clear ?PULM: CTA B, normal effort ?CV: RRR, no mgr ?GI: BS+, soft, nontender ?MSK: normal bulk and tone ?Neuro: awake, alert, no distress, MAEW ? ?CXR images personally reviewed from this morning> chest tube in place, residual infiltrate noted, trace pleural effusion ? ?Impression: ?CAP ?Complicated parapneumonic effusion ? ?Plan: ?Remove chest tube ?CXR after tube removal ?Instructed her to keep dressing in place for 48 hours after removal ?Will need to follow up with [facility] in 2 weeks for CXR, will arrange ? ?Roselie Awkward, MD ?[facility] ?Pager: [phone] ?Cell: [phone] ?After 7:00 pm call Elink  [phone] ? ? ?

## 2022-01-20 NOTE — Care Management Important Message (Signed)
Important Message ? ?Patient Details IM Letter given to the Patient. ?Name: Yolanda Burke ?MRN: [id number] ?Date of Birth: 1932/10/01 ? ? ?Medicare Important Message Given:  Yes ? ? ? ? ?Kerin Salen ?01/20/2022, 10:57 AM ?

## 2022-01-20 NOTE — Plan of Care (Signed)

## 2022-01-20 NOTE — Telephone Encounter (Signed)
Please arrange follow up visit with any APP or physician in 2 weeks for follow up of pleural effusion.  Will need CXR. ?

## 2022-01-20 NOTE — Progress Notes (Signed)
This nurse noticed some leakage while flushing at the patient's chest tube insertion site. The site also had a slight bulge. Called Rapid Response nurse to come evaluate. Paged on-call NP. Received an order for Stat Chest X-Ray to evaluate possible malpositioning.  ?Chest X Ray did not show any significant changes.  Patient is not presenting with any new symptoms.  Nursing will continue to monitor.  ?

## 2022-01-20 NOTE — Discharge Summary (Signed)
Physician Discharge Summary  ?Yolanda Burke RSW:[id number] DOB: 1932/07/06 DOA: 01/13/2022 ? ?PCP: Virgie Dad, MD ? ?Admit date: 01/13/2022 ?Discharge date: 01/20/2022 ?Recommendations for Outpatient Follow-up:  ?Follow up with PCP in 1 weeks-call for appointment ?Please obtain BMP/CBC in one week ? ?Discharge Dispo: ILF ?Discharge Condition: Stable ?Code Status:   Code Status: DNR ?Diet recommendation:  ?Diet Order   ? ?       ?  Diet - low sodium heart healthy       ?  ?  Diet Heart Room service appropriate? Yes; Fluid consistency: Thin  Diet effective now       ?  ? ?  ?  ? ?  ?Brief/Interim Summary: ?88 y.o. with history of chronic back pain, chronic cystitis with hematuria, diverticulitis, HTN, HLD, nephrolithiasis, cholelithiasis, macular degeneration, hypertensive retinopathy presented to [facility] w/ dyspnea. In ED  had elevated d-dimer. CTA chest was negative for clot, but did show multifocal PNA and a loculated pleural effusion. She was started on rocephin/zithro, placed on antibiotics bronchodilators steroids.  Patient reported that she had visited [place] last week and got sick, heat exhaustion there, was given IV fluids and oral antibiotics for 6 days prior to admission , she arrived to [place] and had completed antibiotics but not improved.  CT scan also showed a loculated pleural effusion and- got chest tube placed by pulmonary 3/23-output is being monitored, continued on suction and managed by pulmonary.  S/P lytics to chest tube. ?Cytology nondiagnostic, pleural fluid LDH 174.  Output has been significantly improved and chest x-ray does not show any pneumothorax and pleural effusion resolved-we will plan to remove chest tube was output less than 100 cc.  Has possible right upper lobe lung nodule will need follow-up CT with pulmonary as outpatient. ?At this time she remains clinically stable on room air, leukocytosis resolved. Of note found to have new onset diabetes placed on metformin educated on  Accu-Chek and will provide glucometer upon discharge.  Chest x-ray after chest tube removal was reviewed with pulmonary no need for further antibiotics, she will be followed up as outpatient  ? ?Discharge Diagnoses:  ?Principal Problem: ?  Multifocal pneumonia ?Active Problems: ?  GERD (gastroesophageal reflux disease) ?  Pure hypercholesterolemia ?  Hyperglycemia, drug-induced ?  Loculated pleural effusion ?  Hyponatremia ?Multifocal pneumonia ?Productive cough ?Loculated right pleural effusion-s/p right chest tube placement 3/23: ?Underwent lytics and chest tube 3/24. Minimal output. At this time she remains clinically stable on room air, leukocytosis resolved. Of note found to have new onset diabetes placed on metformin educated on Accu-Chek and will provide glucometer upon discharge.  Chest x-ray after chest tube removal was reviewed with pulmonary no need for further antibiotics, she will be followed up as outpatient  ?Recent Labs  ?Lab 01/15/22 ?0518 01/16/22 ?0938 01/17/22 ?0427 01/18/22 ?1829 01/19/22 ?0455 01/20/22 ?0430  ?WBC  --  16.8* 14.8* 12.8* 10.6* 9.7  ?PROCALCITON <0.10 0.16 <0.10  --   --   --   ?Mild hyponatremia monitor, in the setting of pulmonary disease.  Stable ?Leukocytosis bump was after steroid,  improving ?GERD:Not on meds. ?Pure hypercholesterolemia:NOT ON MEDS ?Hyperglycemia, drug-induced. ?Type 2 diabetes mellitus new onset: ?A1c in 6.7 Diabetic range.Added metformin low-dose,continue sliding scale insulin blood sugar stable. ? ?Consults: ?Pccm ? ?Subjective: ?Alert awake oriented resting comfortably. ? ?Discharge Exam: ?Vitals:  ? 01/19/22 1950 01/20/22 0435  ?BP: (!) 133/55 (!) 144/57  ?Pulse: 81 68  ?Resp: 20 20  ?Temp: 98.5 ?F (36.9 ?  C) 97.7 ?F (36.5 ?C)  ?SpO2: 97% 96%  ? ?General: Pt is alert, awake, not in acute distress ?Cardiovascular: RRR, S1/S2 +, no rubs, no gallops ?Respiratory: CTA bilaterally, no wheezing, no rhonchi ?Abdominal: Soft, NT, ND, bowel sounds  + ?Extremities: no edema, no cyanosis ? ?Discharge Instructions ? ?Discharge Instructions   ? ? Diet - low sodium heart healthy   Complete by: As directed ?  ? Discharge instructions   Complete by: As directed ?  ? Follow-up with pulmonary as outpatient in 2 weeks.  Follow-up with PCP in 1 week. ? ?Please call call MD or return to ER for similar or worsening recurring problem that brought you to hospital or if any fever,nausea/vomiting,abdominal pain, uncontrolled pain, chest pain,  shortness of breath or any other alarming symptoms. ? ?Please follow-up your doctor as instructed in a week time and call the office for appointment. ? ?Please avoid alcohol, smoking, or any other illicit substance and maintain healthy habits including taking your regular medications as prescribed. ? ?You were cared for by a hospitalist during your hospital stay. If you have any questions about your discharge medications or the care you received while you were in the hospital after you are discharged, you can call the unit and ask to speak with the hospitalist on call if the hospitalist that took care of you is not available. ? ?Once you are discharged, your primary care physician will handle any further medical issues. Please note that NO REFILLS for any discharge medications will be authorized once you are discharged, as it is imperative that you return to your primary care physician (or establish a relationship with a primary care physician if you do not have one) for your aftercare needs so that they can reassess your need for medications and monitor your lab values ?  ? ?Check blood sugar 3 times a day and bedtime at home. ?If blood sugar running above 200 less than 70 please call your MD to adjust insulin. ?If blood sugars running less 100 do not use insulin and call MD. ?If you noticed signs and symptoms of hypoglycemia or low blood sugar like jitteriness, confusion, thirst, tremor, sweating- Check blood sugar, drink sugary  drink/biscuits/sweets to increase sugar level and call MD or return to ER.  ? Discharge wound care:   Complete by: As directed ?  ? Keep the dressing in place for 48 hours  ? Increase activity slowly   Complete by: As directed ?  ? ?  ? ?Allergies as of 01/20/2022   ? ?   Reactions  ? Oysters [shellfish Allergy] Swelling  ? Sulfa Antibiotics   ? CANNOT REMEMBER REACTION  ? ?  ? ?  ?Medication List  ?  ? ?TAKE these medications   ? ?blood glucose meter kit and supplies Kit ?Dispense based on patient and insurance preference. Use up to four times daily as directed. ?  ?dextromethorphan-guaiFENesin 30-600 MG 12hr tablet ?Commonly known as: [place] DM ?Take 1 tablet by mouth 2 (two) times daily for 7 days. ?  ?ICAPS AREDS 2 PO ?Take 1 tablet by mouth 2 (two) times daily. ?  ?lidocaine 5 % ?Commonly known as: LIDODERM ?Place 1 patch onto the skin daily for 5 days. Remove & Discard patch within 12 hours or as directed by MD ?  ?metFORMIN 500 MG tablet ?Commonly known as: GLUCOPHAGE ?Take 1 tablet (500 mg total) by mouth daily with breakfast. ?  ? ?  ? ?  ?  ? ? ?  ?  Discharge Care Instructions  ?(From admission, onward)  ?  ? ? ?  ? ?  Start     Ordered  ? 01/20/22 0000  Discharge wound care:       ?Comments: Keep the dressing in place for 48 hours  ? 01/20/22 1128  ? ?  ?  ? ?  ? ? Follow-up Information   ? ? Virgie Dad, MD Follow up in 1 week(s).   ?Specialty: Internal Medicine ?Contact information: ?[address] ?[place] [address] ?[phone] ? ? ?  ?  ? ?  ?  ? ?  ? ?Allergies  ?Allergen Reactions  ? Oysters [Shellfish Allergy] Swelling  ? Sulfa Antibiotics   ?  CANNOT REMEMBER REACTION  ? ? ?The results of significant diagnostics from this hospitalization (including imaging, microbiology, ancillary and laboratory) are listed below for reference.   ? ?Microbiology: ?Recent Results (from the past 240 hour(s))  ?Expectorated Sputum Assessment w Gram Stain, Rflx to Resp Cult     Status: None  ? Collection  Time: 01/13/22 12:13 PM  ? Specimen: Sputum  ?Result Value Ref Range Status  ? Specimen Description   Final  ?  SPUTUM ?Performed at [facility], [address], [place], [place] [address] ?  ? Special Reques

## 2022-01-21 ENCOUNTER — Telehealth: Payer: Self-pay | Admitting: *Deleted

## 2022-01-21 NOTE — Telephone Encounter (Signed)
Transition Care Management Follow-up Telephone Call ?Date of discharge and from where: 01/20/2022 ?How have you been since you were released from the hospital? Doing quite well.  ?Any questions or concerns? No ? ?Items Reviewed: ?Did the pt receive and understand the discharge instructions provided? Yes  ?Medications obtained and verified? Yes  ?Other? No  ?Any new allergies since your discharge? No  ?Dietary orders reviewed? Yes ?Do you have support at home? Yes  ? ?Home Care and Equipment/Supplies: ?Were home health services ordered? no ?If so, what is the name of the agency? na  ?Has the agency set up a time to come to the patient's home? not applicable ?Were any new equipment or medical supplies ordered?  No ?What is the name of the medical supply agency? na ?Were you able to get the supplies/equipment? not applicable ?Do you have any questions related to the use of the equipment or supplies? No ? ?Functional Questionnaire: (I = Independent and D = Dependent) ?ADLs: I ? ?Bathing/Dressing- I ? ?Meal Prep- I ? ?Eating- I ? ?Maintaining continence- I ? ?Transferring/Ambulation- I ? ?Managing Meds- I ? ?Follow up appointments reviewed: ? ?PCP Hospital f/u appt confirmed? Yes  Scheduled to see Christy on 01/27/2022 @ 3. ?[facility] f/u appt confirmed? No   ?Are transportation arrangements needed? No  ?If their condition worsens, is the pt aware to call PCP or go to the Emergency Dept.? Yes ?Was the patient provided with contact information for the PCP's office or ED? Yes ?Was to pt encouraged to call back with questions or concerns? Yes  ?

## 2022-01-21 NOTE — Telephone Encounter (Signed)
Patient is scheduled for 02/03/2022 at 9:30am with Derl Barrow, NP- left voicemail informing of appointment and mailed appointment reminder. ?

## 2022-01-27 ENCOUNTER — Encounter: Payer: Medicare Other | Admitting: Adult Health

## 2022-01-28 LAB — BASIC METABOLIC PANEL
BUN: 10 (ref 4–21)
CO2: 25 — AB (ref 13–22)
Chloride: 105 (ref 99–108)
Creatinine: 0.6 (ref 0.5–1.1)
Glucose: 118
Potassium: 4.6 mEq/L (ref 3.5–5.1)
Sodium: 142 (ref 137–147)

## 2022-01-28 LAB — CBC AND DIFFERENTIAL
HCT: 35 — AB (ref 36–46)
Hemoglobin: 11.4 — AB (ref 12.0–16.0)
Platelets: 343 10*3/uL (ref 150–400)
WBC: 6.4

## 2022-01-28 LAB — CBC: RBC: 4.07 (ref 3.87–5.11)

## 2022-01-28 LAB — COMPREHENSIVE METABOLIC PANEL: Calcium: 8.9 (ref 8.7–10.7)

## 2022-01-28 NOTE — Progress Notes (Signed)
This encounter was created in error - please disregard.

## 2022-02-03 ENCOUNTER — Ambulatory Visit (INDEPENDENT_AMBULATORY_CARE_PROVIDER_SITE_OTHER): Payer: Medicare Other | Admitting: Primary Care

## 2022-02-03 ENCOUNTER — Ambulatory Visit (INDEPENDENT_AMBULATORY_CARE_PROVIDER_SITE_OTHER): Payer: Medicare Other

## 2022-02-03 ENCOUNTER — Encounter: Payer: Self-pay | Admitting: Primary Care

## 2022-02-03 VITALS — BP 122/64 | HR 95 | Temp 98.1°F | Ht 61.0 in | Wt 134.8 lb

## 2022-02-03 DIAGNOSIS — J189 Pneumonia, unspecified organism: Secondary | ICD-10-CM | POA: Diagnosis not present

## 2022-02-03 DIAGNOSIS — R7309 Other abnormal glucose: Secondary | ICD-10-CM | POA: Diagnosis not present

## 2022-02-03 DIAGNOSIS — J90 Pleural effusion, not elsewhere classified: Secondary | ICD-10-CM

## 2022-02-03 LAB — CBC
HCT: 35.7 % — ABNORMAL LOW (ref 36.0–46.0)
Hemoglobin: 11.9 g/dL — ABNORMAL LOW (ref 12.0–15.0)
MCHC: 33.5 g/dL (ref 30.0–36.0)
MCV: 85.2 fl (ref 78.0–100.0)
Platelets: 384 10*3/uL (ref 150.0–400.0)
RBC: 4.19 Mil/uL (ref 3.87–5.11)
RDW: 14 % (ref 11.5–15.5)
WBC: 6.4 10*3/uL (ref 4.0–10.5)

## 2022-02-03 LAB — BASIC METABOLIC PANEL
BUN: 12 mg/dL (ref 6–23)
CO2: 25 mEq/L (ref 19–32)
Calcium: 9.2 mg/dL (ref 8.4–10.5)
Chloride: 104 mEq/L (ref 96–112)
Creatinine, Ser: 0.83 mg/dL (ref 0.40–1.20)
GFR: 63.02 mL/min (ref >60.00)
Glucose, Bld: 159 mg/dL — ABNORMAL HIGH (ref 70–99)
Potassium: 4.2 mEq/L (ref 3.5–5.1)
Sodium: 140 mEq/L (ref 135–145)

## 2022-02-03 NOTE — Progress Notes (Signed)
? ?@Patient  ID: Yolanda Burke, female    DOB: November 23, 1931, 88 y.o.   MRN: [id number] ? ?Chief Complaint  ?Patient presents with  ? Hospitalization Follow-up  ?  Pt states she is just having some back pain, location lumbar region to the right. She states she feels better since leaving the hospital. She states she is able to take a deep breath now with no issues. She states she went to the hospital for PNA.   ? ? ?Referring provider: ?Virgie Dad, MD ? ?HPI: ?88 year old female, former smoker quit 1986.  Past medical history significant for multifocal pneumonia, loculated pleural effusion, GERD, macular degeneration of both eyes, diverticulitis, chronic back pain, cholelithiasis, hypercholesterolemia, vocal tremor. ? ?02/03/2022 ?Patient was admitted from 01/13/2022 - 01/20/2022 for community-acquired pneumonia.  Treated with Rocephin and azithromycin.  CT scan showed loculated pleural effusion, status post chest tube placed by pulmonary on 01/16/22. She received lytics to chest tube.  Cytology nondiagnostic, pleural fluid LDH 174.  Chest x-ray showed pleural effusion resolved.  Possible right upper lobe pulmonary nodule on CT scan, will need outpatient follow-up. ? ?She presents today for hospital follow-up. She lives at [facility]. She is feeling great, states that she can now take a deep breath. No residual shortness of breath or cough. She has some right posterior back pain similar to when she was in the hospital. Pain will cause muscle spasm. Repeat CXR today showed small volume RIGHT pleural effusion and linear opacities at the RIGHT lung base, favor atelectasis.She has been using IS and flutter valve several times a day. She had labs at [facility] after hospitalization, hgb was 11.4 and glucose was 118. When she tests her blood sugar at home it has been running <150.  ? ?Imaging: ?01/13/22 CTA >> No evidence of pulmonary embolism. Moderate multiloculated right pleural effusion. Right middle and lower lobe  atelectasis versus infiltrate. No evidence of centrally obstructing mass or lymphadenopathy. ?  ? ?Allergies  ?Allergen Reactions  ? Oysters [Shellfish Allergy] Swelling  ? Sulfa Antibiotics   ?  CANNOT REMEMBER REACTION  ? ? ?Immunization History  ?Administered Date(s) Administered  ? Fluad Quad(high Dose 65+) 08/30/2021  ? Influenza, High Dose Seasonal PF 08/05/2019, 08/31/2020  ? Influenza-Unspecified 10/27/2017  ? Moderna Sars-Covid-2 Vaccination 11/06/2019, 12/06/2019, 09/11/2020, 04/06/2021  ? ? ?Past Medical History:  ?Diagnosis Date  ? Allergy   ? Arthritis   ? Back pain   ? Chronic cystitis with hematuria   ? Colon polyps   ? Cystocele, unspecified (CODE)   ? Diverticulitis   ? Dyslipidemia   ? Gallstones   ? Gastric ulcer   ? Gastritis   ? GERD (gastroesophageal reflux disease)   ? Hearing loss   ? Hemorrhoids   ? Hyperlipidemia   ? Hypertension   ? Hypertensive retinopathy   ? OU  ? Impaired fasting glucose   ? Macular degeneration   ? Dry OU  ? Melanoma in situ of neck ([place])   ? Renal insufficiency   ? ? ?Tobacco History: ?Social History  ? ?Tobacco Use  ?Smoking Status Former  ? Types: Cigarettes  ? Quit date: 03/14/1985  ? Years since quitting: 36.9  ?Smokeless Tobacco Never  ?Tobacco Comments  ? Smoked until age 89  ? ?Counseling given: Not Answered ?Tobacco comments: Smoked until age 61 ? ? ?Outpatient Medications Prior to Visit  ?Medication Sig Dispense Refill  ? blood glucose meter kit and supplies KIT Dispense based on patient and insurance preference. Use up to  four times daily as directed. 1 each 0  ? lidocaine (LIDODERM) 5 % SMARTSIG:1 Patch(s) Topical As Directed    ? metFORMIN (GLUCOPHAGE) 500 MG tablet Take 1 tablet (500 mg total) by mouth daily with breakfast. 30 tablet 0  ? Multiple Vitamins-Minerals (ICAPS AREDS 2 PO) Take 1 tablet by mouth 2 (two) times daily.    ? ReliOn Ultra Thin Lancets 30G MISC USE UP TO 4 TIMES DAILY AS DIRECTED    ? ?No facility-administered medications prior to  visit.  ? ?Review of Systems ? ?Review of Systems  ?Constitutional: Negative.   ?HENT: Negative.    ?Respiratory:  Negative for chest tightness, shortness of breath and wheezing.   ?     Pleuritic pain   ? ? ?Physical Exam ? ?BP 122/64 (BP Location: Left Arm, Patient Position: Sitting, Cuff Size: Normal)   Pulse 95   Temp 98.1 ?F (36.7 ?C) (Oral)   Ht 5' 1"  (1.549 m)   Wt 134 lb 12.8 oz (61.1 kg)   SpO2 100%   BMI 25.47 kg/m?  ?Physical Exam ?Constitutional:   ?   Appearance: Normal appearance.  ?HENT:  ?   Head: Normocephalic and atraumatic.  ?   Mouth/Throat:  ?   Mouth: Mucous membranes are moist.  ?   Pharynx: Oropharynx is clear.  ?Cardiovascular:  ?   Rate and Rhythm: Normal rate and regular rhythm.  ?Pulmonary:  ?   Effort: Pulmonary effort is normal.  ?   Breath sounds: Normal breath sounds. No wheezing, rhonchi or rales.  ?   Comments: Slightly diminished right middle-lower lobe ?Musculoskeletal:     ?   General: Normal range of motion.  ?Skin: ?   General: Skin is warm and dry.  ?Neurological:  ?   General: No focal deficit present.  ?   Mental Status: She is alert and oriented to person, place, and time. Mental status is at baseline.  ?Psychiatric:     ?   Mood and Affect: Mood normal.     ?   Behavior: Behavior normal.     ?   Thought Content: Thought content normal.     ?   Judgment: Judgment normal.  ?  ? ?Lab Results: ? ?CBC ?   ?Component Value Date/Time  ? WBC 6.4 02/03/2022 1019  ? RBC 4.19 02/03/2022 1019  ? HGB 11.9 (L) 02/03/2022 1019  ? HCT 35.7 (L) 02/03/2022 1019  ? PLT 384.0 02/03/2022 1019  ? MCV 85.2 02/03/2022 1019  ? MCH 28.2 01/20/2022 0430  ? MCHC 33.5 02/03/2022 1019  ? RDW 14.0 02/03/2022 1019  ? LYMPHSABS 0.8 01/15/2022 0512  ? MONOABS 0.7 01/15/2022 0512  ? EOSABS 0.0 01/15/2022 0512  ? BASOSABS 0.1 01/15/2022 0512  ? ? ?BMET ?   ?Component Value Date/Time  ? NA 140 02/03/2022 1019  ? NA 141 03/21/2021 0000  ? K 4.2 02/03/2022 1019  ? CL 104 02/03/2022 1019  ? CO2 25  02/03/2022 1019  ? GLUCOSE 159 (H) 02/03/2022 1019  ? BUN 12 02/03/2022 1019  ? BUN 13 03/21/2021 0000  ? CREATININE 0.83 02/03/2022 1019  ? CALCIUM 9.2 02/03/2022 1019  ? GFRNONAA >60 01/20/2022 0430  ? GFRAA >60 01/19/2018 1150  ? ? ?BNP ?   ?Component Value Date/Time  ? BNP 159.1 (H) 01/13/2022 1111  ? ? ?ProBNP ?No results found for: PROBNP ? ?Imaging: ?DG Chest 1 View ? ?Result Date: 01/16/2022 ?CLINICAL DATA:  Pneumonia EXAM: CHEST  1  VIEW COMPARISON:  01/16/2022 at 11:51 a.m. FINDINGS: Single frontal view of the chest demonstrates interval placement of a pigtail drainage catheter overlying the medial right lung base. Decreased right pleural effusion. Persistent areas of underlying consolidation at the right lung base compatible with given history of pneumonia. Patchy retrocardiac consolidation felt to be hypoventilatory. No pneumothorax. No acute bony abnormalities. IMPRESSION: 1. Decreased right pleural effusion after right pigtail drainage catheter placement. 2. Persistent multifocal right basilar airspace disease consistent with pneumonia. 3. Suspected hypoventilatory changes left lower lobe. Electronically Signed   By: Randa Ngo M.D.   On: 01/16/2022 16:02  ? ?DG Chest 2 View ? ?Result Date: 02/03/2022 ?CLINICAL DATA:  Pneumonia. EXAM: CHEST - 2 VIEW COMPARISON:  Chest XR, most recently 01/20/2022. CT chest, 01/13/2022. FINDINGS: Cardiomediastinal silhouette is within normal limits. Lungs are well inflated. Persistent linear streaky opacities within the middle and RIGHT lower lobe, and trace fluid layering along the minor fissure. Small volume of RIGHT pleural effusion. No pneumothorax. Thoracic spine and bilateral glenohumeral degenerative change. No acute osseous abnormality. IMPRESSION: Small volume RIGHT pleural effusion and linear opacities at the RIGHT lung base, favor atelectasis. Electronically Signed   By: Michaelle Birks M.D.   On: 02/03/2022 09:36  ? ?DG Chest 2 View ? ?Result Date:  01/16/2022 ?CLINICAL DATA:  Pneumonia, hypertension EXAM: CHEST - 2 VIEW COMPARISON:  Previous studies including the examination of 01/13/2022 FINDINGS: Transverse diameter of heart is increased. There are no signs of alveolar pulmonary

## 2022-02-03 NOTE — Progress Notes (Signed)
Please let patient know her glucose was 159, this was non-fasting. Follow up with MD at her assisted living facility. HGB 11.9 (normal is 12-15). This is not concerning and improved from 2 weeks ago. Rest of her labs were normal.

## 2022-02-03 NOTE — Assessment & Plan Note (Addendum)
-   Random glucose on BMET today was 159. She was not fasting. Patient to follow-up with MD at [facility].  ?

## 2022-02-03 NOTE — Assessment & Plan Note (Addendum)
-   Resolved; Hospitalized for CAP with parapneumonic effusion in late March 2023, treated with Rocephin and Azithromycin. S/p chest tube with fibrinolytics. No malignant cells on cytology. No residual respiratory symptoms or cough. Repeat CXR showed linear opacities at right lung base favoring atelectasis. Basic labs were normal.  Encourage deep breathing exercise.  ?

## 2022-02-03 NOTE — Assessment & Plan Note (Addendum)
-   Clinically improved; No residual shortness of breath. Mild right sided pleuritic pain similar to previous. Repeat CXR today showed small volume RIGHT pleural effusion. FU in 4 weeks with repeat CXR. If not resolved needs CT chest.  ?

## 2022-02-03 NOTE — Patient Instructions (Addendum)
Recommendations: ?Continue to use Incentive spirometer every hour ? ?Orders: ?Labs today  ?CXR in 4 weeks  ? ?Follow-up: ?4 weeks with Dr. Chase Caller - come early for CXR (15 min visit ok)  ?

## 2022-02-04 ENCOUNTER — Encounter: Payer: Self-pay | Admitting: Internal Medicine

## 2022-02-05 ENCOUNTER — Ambulatory Visit: Payer: Medicare Other | Admitting: Internal Medicine

## 2022-02-05 ENCOUNTER — Encounter: Payer: Self-pay | Admitting: Internal Medicine

## 2022-02-05 VITALS — BP 144/76 | HR 96 | Temp 97.5°F | Ht 61.0 in | Wt 136.0 lb

## 2022-02-05 DIAGNOSIS — Z8701 Personal history of pneumonia (recurrent): Secondary | ICD-10-CM

## 2022-02-05 DIAGNOSIS — E1165 Type 2 diabetes mellitus with hyperglycemia: Secondary | ICD-10-CM | POA: Diagnosis not present

## 2022-02-05 DIAGNOSIS — K5790 Diverticulosis of intestine, part unspecified, without perforation or abscess without bleeding: Secondary | ICD-10-CM | POA: Diagnosis not present

## 2022-02-05 DIAGNOSIS — E7800 Pure hypercholesterolemia, unspecified: Secondary | ICD-10-CM

## 2022-02-05 MED ORDER — PRAVASTATIN SODIUM 20 MG PO TABS: 20.0000 mg | ORAL_TABLET | Freq: Every day | ORAL | 3 refills | Status: DC

## 2022-02-05 NOTE — Progress Notes (Signed)
? ?Location:   ?  ?Place of Service:    ? ?Provider:  ? ?Code Status:  ?Goals of Care:  ? ?  01/19/2022  ? 10:00 PM  ?Advanced Directives  ?Does Patient Have a Medical Advance Directive? Yes  ?Type of Advance Directive Healthcare Power of Attorney  ? ? ? ?Chief Complaint  ?Patient presents with  ? Acute Visit  ?  Patient returns to the clinic for hospital follow up on pneumonia.   ? ? ?HPI: Patient is a 88 y.o. female seen today for an acute visit for her Diabetes and Pneumonia ? ?Patient was admitted in the hospital from 3/20 to 3/27 for community-acquired pneumonia ?She was treated with Rocephin and azithromycin.  CT scan of the chest also showed loculated pleural effusion and patient had pulmonary chest tube placed on 03/23 ?Cytology was nondiagnostic with LDH of 174 ? patient seen by pulmonary few days ago ago had a repeat chest x-ray done in pulmonary which showed small pleural effusion. ? ?She also was diagnosed with new onset diabetes and was started on metformin ?A1c was 6.7.   CBGs was more than 150 at that time she also gets got some Solu-Medrol ?Marland Kitchen   ?Patient is not taking her metformin as she states that she was not explained properly so she  decided not to take any new medication. ?Patient is doing Accu-Cheks at home and is usually getting it CBGs of 1 20-1 40 fasting ?She denies any shortness of breath or chest pain or cough.  Wants to know if she can go back and exercise again ? ?Her other issues ?h/o Diverticulosis ?Still has loose Stools sometimes ?Saw Dr Henrene Pastor in GI. Did not recommend Colonoscopy ?Gave her prescription of Cipro to use of needed ?  ?Keratosis on Right Shin ?Was seen by dermatology ?Macular Degeneration ?HLD wants to know if she can take statin qod ?Arthritis ?But very active plays Golf ?  ?H/o Hematuria. Retested UA has been negative. Wants to know if we can test her again ?Has Hammer Toe in Right foot. ?  ?Past Medical History:  ?Diagnosis Date  ? Allergy   ? Arthritis   ? Back pain    ? Chronic cystitis with hematuria   ? Colon polyps   ? Cystocele, unspecified (CODE)   ? Diverticulitis   ? Dyslipidemia   ? Gallstones   ? Gastric ulcer   ? Gastritis   ? GERD (gastroesophageal reflux disease)   ? Hearing loss   ? Hemorrhoids   ? Hyperlipidemia   ? Hypertension   ? Hypertensive retinopathy   ? OU  ? Impaired fasting glucose   ? Macular degeneration   ? Dry OU  ? Melanoma in situ of neck ([place])   ? Renal insufficiency   ? ? ?Past Surgical History:  ?Procedure Laterality Date  ? APPENDECTOMY    ? CATARACT EXTRACTION Bilateral   ? Dr. Loni Dolly - [place], [place]  ? CHOLECYSTECTOMY    ? with appendectomy  ? EYE SURGERY Bilateral   ? Cat Sx - Dr. Loni Dolly - [place], [place]  ? THROAT SURGERY    ? calcified ligament  ? TONSILLECTOMY    ? ? ?Allergies  ?Allergen Reactions  ? Oysters [Shellfish Allergy] Swelling  ? Sulfa Antibiotics   ?  CANNOT REMEMBER REACTION  ? ? ?Outpatient Encounter Medications as of 02/05/2022  ?Medication Sig  ? Multiple Vitamins-Minerals (ICAPS AREDS 2 PO) Take 1 tablet by mouth 2 (two) times daily.  ? [DISCONTINUED]  blood glucose meter kit and supplies KIT Dispense based on patient and insurance preference. Use up to four times daily as directed.  ? [DISCONTINUED] lidocaine (LIDODERM) 5 % SMARTSIG:1 Patch(s) Topical As Directed  ? [DISCONTINUED] metFORMIN (GLUCOPHAGE) 500 MG tablet Take 1 tablet (500 mg total) by mouth daily with breakfast.  ? [DISCONTINUED] ReliOn Ultra Thin Lancets 30G MISC USE UP TO 4 TIMES DAILY AS DIRECTED  ? ?No facility-administered encounter medications on file as of 02/05/2022.  ? ? ?Review of Systems:  ?Review of Systems  ?Constitutional:  Negative for activity change and appetite change.  ?HENT: Negative.    ?Respiratory:  Negative for cough and shortness of breath.   ?Cardiovascular:  Negative for leg swelling.  ?Gastrointestinal:  Negative for constipation.  ?Genitourinary: Negative.   ?Musculoskeletal:  Negative for arthralgias, gait problem and myalgias.   ?Skin: Negative.   ?Neurological:  Negative for dizziness and weakness.  ?Psychiatric/Behavioral:  Negative for confusion, dysphoric mood and sleep disturbance.   ? ?Health Maintenance  ?Topic Date Due  ? TETANUS/TDAP  Never done  ? Zoster Vaccines- Shingrix (1 of 2) Never done  ? Pneumonia Vaccine 83+ Years old (1 - PCV) Never done  ? DEXA SCAN  Never done  ? COVID-19 Vaccine (5 - Booster for Moderna series) 06/01/2021  ? INFLUENZA VACCINE  05/27/2022  ? HPV VACCINES  Aged Out  ? ? ?Physical Exam: ?Vitals:  ? 02/05/22 0905  ?BP: (!) 144/76  ?Pulse: 96  ?Temp: (!) 97.5 ?F (36.4 ?C)  ?SpO2: 97%  ?Weight: 136 lb (61.7 kg)  ?Height: 5' 1"  (1.549 m)  ? ?Body mass index is 25.7 kg/m?Marland Kitchen ?Physical Exam ?Vitals reviewed.  ?Constitutional:   ?   Appearance: Normal appearance.  ?HENT:  ?   Head: Normocephalic.  ?   Nose: Nose normal.  ?   Mouth/Throat:  ?   Mouth: Mucous membranes are moist.  ?   Pharynx: Oropharynx is clear.  ?Eyes:  ?   Pupils: Pupils are equal, round, and reactive to light.  ?Cardiovascular:  ?   Rate and Rhythm: Normal rate and regular rhythm.  ?   Pulses: Normal pulses.  ?   Heart sounds: Normal heart sounds. No murmur heard. ?Pulmonary:  ?   Effort: Pulmonary effort is normal.  ?   Breath sounds: Normal breath sounds.  ?Abdominal:  ?   General: Abdomen is flat. Bowel sounds are normal.  ?   Palpations: Abdomen is soft.  ?Musculoskeletal:     ?   General: No swelling.  ?   Cervical back: Neck supple.  ?Skin: ?   General: Skin is warm.  ?Neurological:  ?   General: No focal deficit present.  ?   Mental Status: She is alert and oriented to person, place, and time.  ?Psychiatric:     ?   Mood and Affect: Mood normal.     ?   Thought Content: Thought content normal.  ? ? ?Labs reviewed: ?Basic Metabolic Panel: ?Recent Labs  ?  03/21/21 ?0000 01/13/22 ?1033 01/13/22 ?1053 01/14/22 ?0446 01/15/22 ?7341 01/16/22 ?0439 01/19/22 ?0455 01/20/22 ?0430 01/28/22 ?0000 02/03/22 ?1019  ?NA 141   < >  --  132* 135   < >  133* 134* 142 140  ?K 4.2   < >  --  3.9 4.1   < > 3.8 4.1 4.6 4.2  ?CL 105   < >  --  101 104   < > 103 103 105 104  ?  CO2 24*   < >  --  21* 22   < > 23 24 25* 25  ?GLUCOSE  --    < >  --  311* 173*   < > 205* 107*  --  159*  ?BUN 13   < >  --  13 17   < > 20 20 10 12   ?CREATININE 0.7   < >  --  0.77 0.71   < > 0.67 0.69 0.6 0.83  ?CALCIUM 9.1   < >  --  8.4* 8.8*   < > 7.8* 8.1* 8.9 9.2  ?MG  --   --   --  2.0 2.0  --   --   --   --   --   ?PHOS  --   --   --  3.3 3.5  --   --   --   --   --   ?TSH 2.90  --  1.427  --   --   --   --   --   --   --   ? < > = values in this interval not displayed.  ? ?Liver Function Tests: ?Recent Labs  ?  01/13/22 ?1033 01/14/22 ?0446 01/15/22 ?2229 01/16/22 ?1509  ?AST 21 21 25   --   ?ALT 28 28 30   --   ?ALKPHOS 65 66 58  --   ?BILITOT 0.6 0.4 0.1*  --   ?PROT 7.0 6.8 6.4* 6.6  ?ALBUMIN 3.2* 2.8* 2.6*  --   ? ?No results for input(s): LIPASE, AMYLASE in the last 8760 hours. ?No results for input(s): AMMONIA in the last 8760 hours. ?CBC: ?Recent Labs  ?  01/13/22 ?1019 01/14/22 ?0446 01/15/22 ?7989 01/16/22 ?0439 01/19/22 ?0455 01/20/22 ?0430 01/28/22 ?0000 02/03/22 ?1019  ?WBC 13.9* 10.5 20.9*   < > 10.6* 9.7 6.4 6.4  ?NEUTROABS 9.6* 8.7* 18.3*  --   --   --   --   --   ?HGB 11.7* 11.8* 10.8*   < > 10.6* 10.7* 11.4* 11.9*  ?HCT 35.6* 36.7 32.6*   < > 32.1* 32.1* 35* 35.7*  ?MCV 85.4 88.4 86.5   < > 86.5 84.7  --  85.2  ?PLT 365 316 363   < > 357 448* 343 384.0  ? < > = values in this interval not displayed.  ? ?Lipid Panel: ?Recent Labs  ?  03/21/21 ?0000  ?CHOL 150  ?HDL 44  ?[place] 70  ?TRIG 182*  ? ?Lab Results  ?Component Value Date  ? HGBA1C 6.7 (H) 01/14/2022  ? ? ?Procedures since last visit: ?DG Chest 1 View ? ?Result Date: 01/16/2022 ?CLINICAL DATA:  Pneumonia EXAM: CHEST  1 VIEW COMPARISON:  01/16/2022 at 11:51 a.m. FINDINGS: Single frontal view of the chest demonstrates interval placement of a pigtail drainage catheter overlying the medial right lung base. Decreased  right pleural effusion. Persistent areas of underlying consolidation at the right lung base compatible with given history of pneumonia. Patchy retrocardiac consolidation felt to be hypoventilatory. No pneu

## 2022-02-05 NOTE — Patient Instructions (Addendum)
Check Blood sugar once a week fasting in the morning ?Should be less then 120-130  ?Can take Tylenol as needed for pain in the back ?Restart taking Prevastatin ?

## 2022-02-14 ENCOUNTER — Other Ambulatory Visit: Payer: Self-pay | Admitting: *Deleted

## 2022-02-14 IMAGING — US US EXTREM LOW*R* LIMITED
1 series · 14 of 24 positions shown · non-contrast
Comparison: None.

CLINICAL DATA: Acute right posterior calf pain.

EXAM:
ULTRASOUND right LOWER EXTREMITY LIMITED
TECHNIQUE: Ultrasound examination of the lower extremity soft tissues was
performed in the area of clinical concern.

[Series 1: us extrem low*right* limited · 0.06mm/px · 14 of 24 slices shown]
[im 1/24]
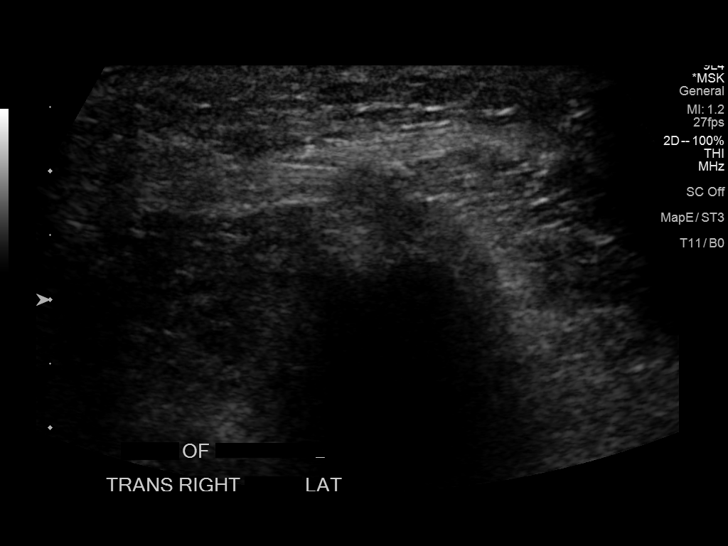
[im 3/24]
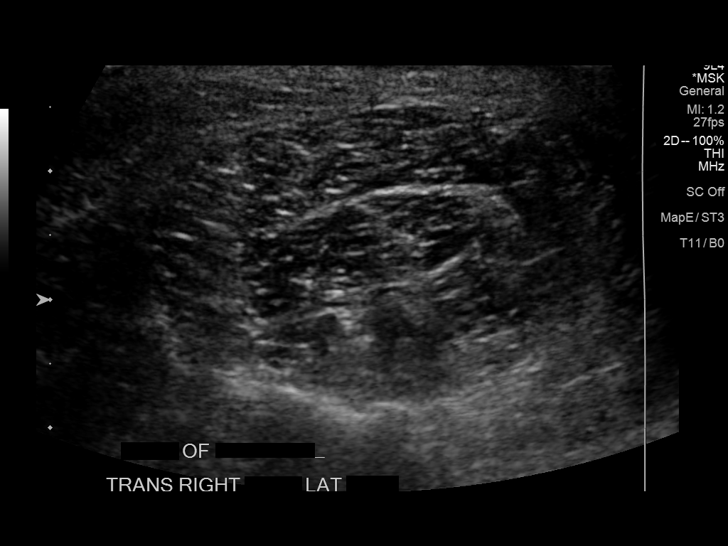
[im 5/24]
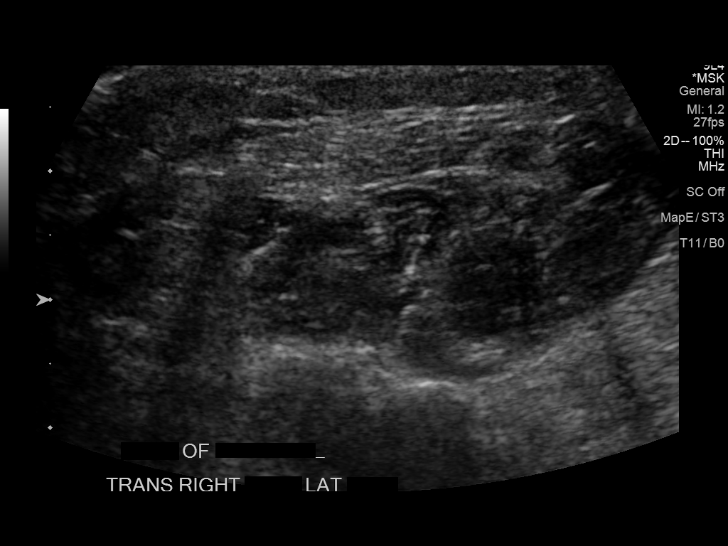
[im 7/24]
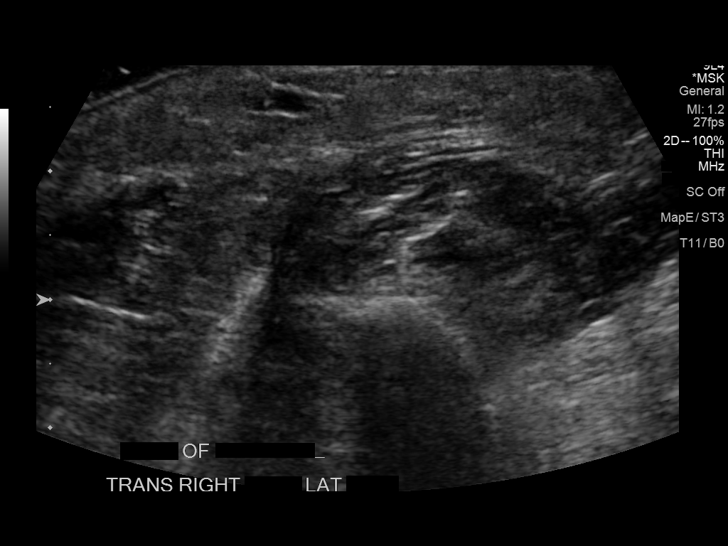
[im 8/24]
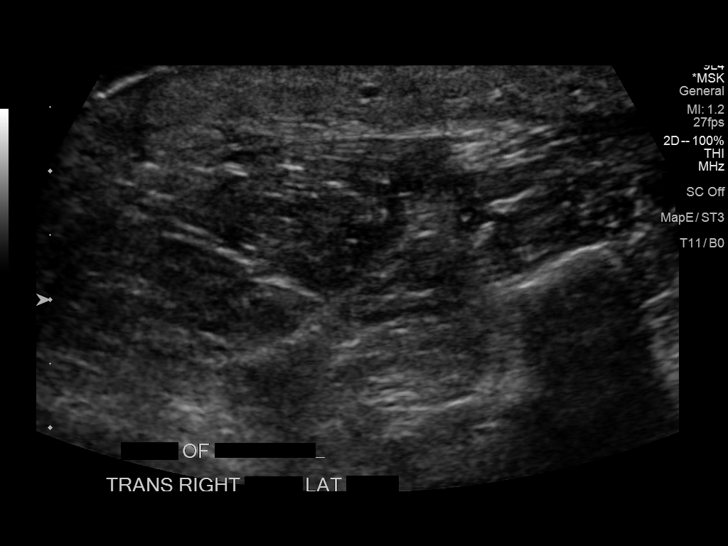
[im 10/24]
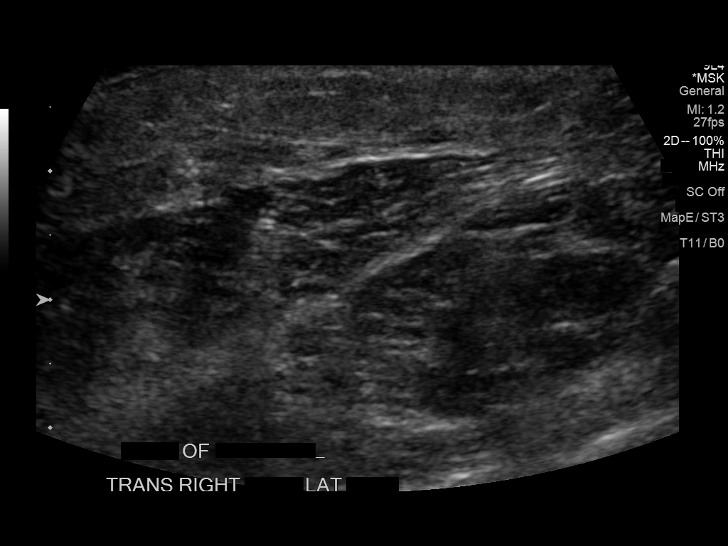
[im 12/24]
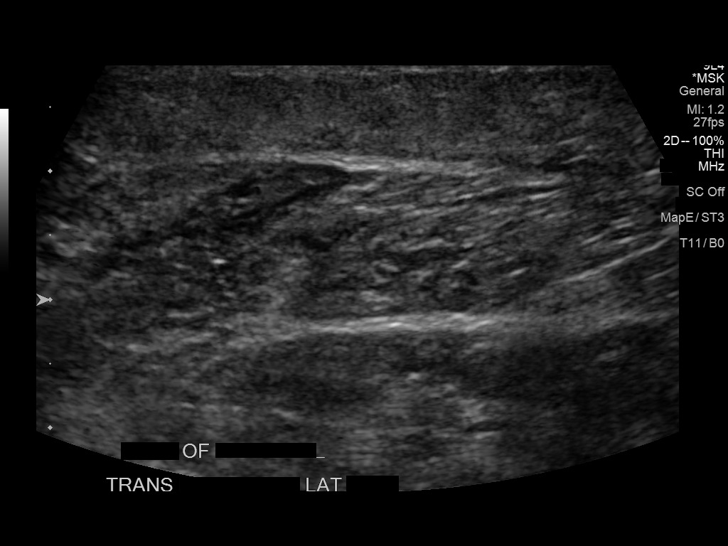
[im 13/24]
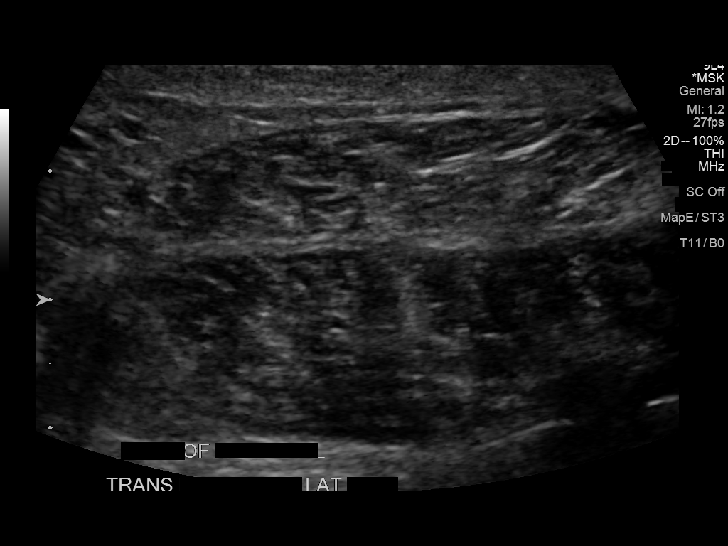
[im 15/24]
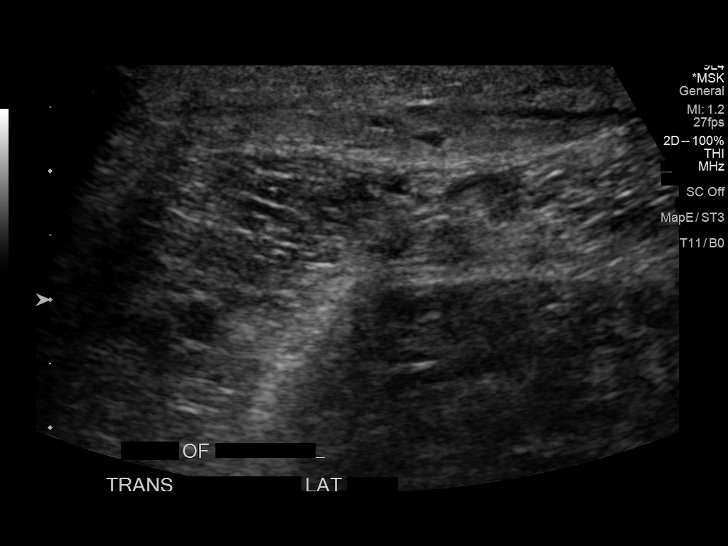
[im 17/24]
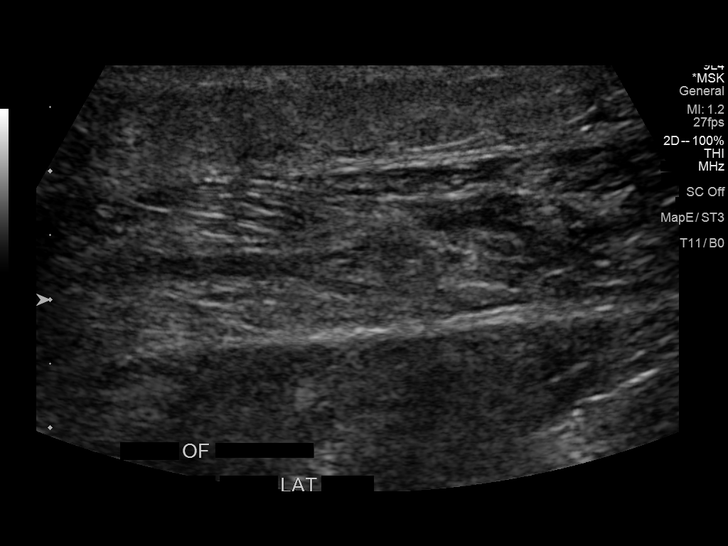
[im 19/24]
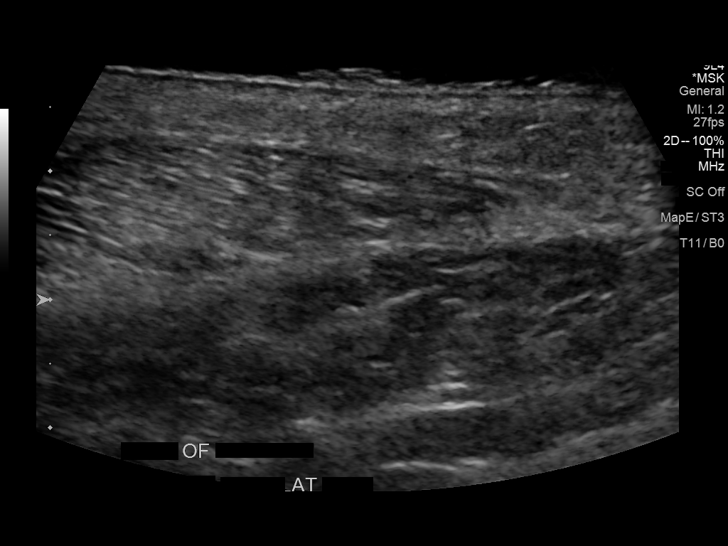
[im 20/24]
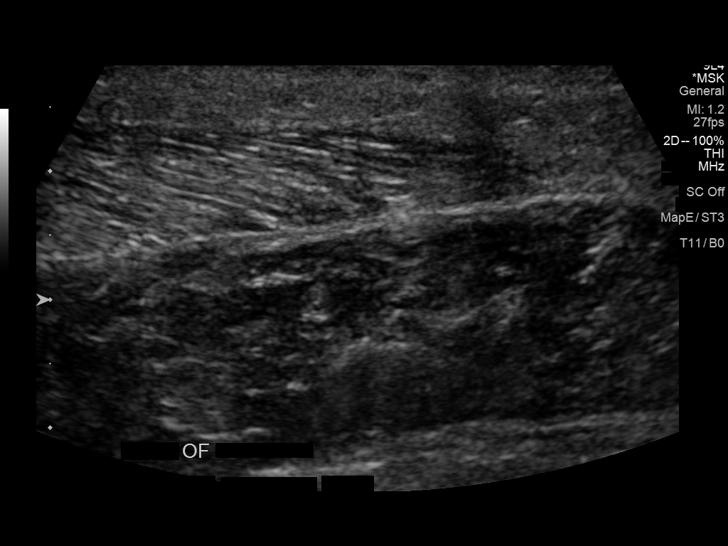
[im 22/24]
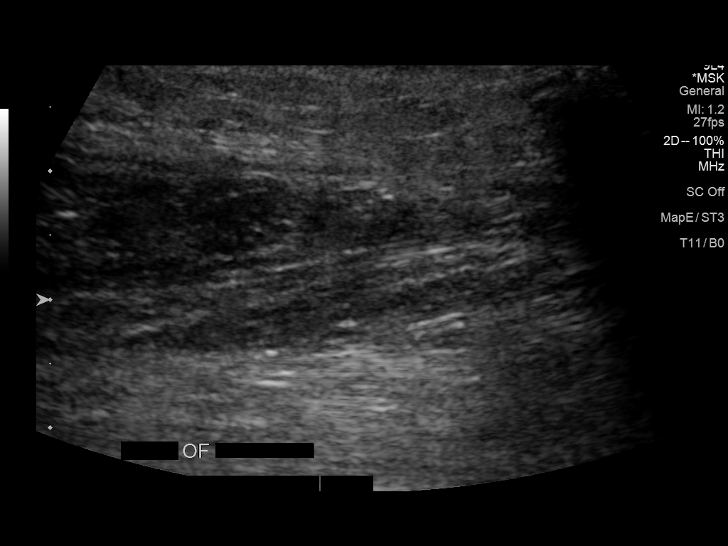
[im 24/24]
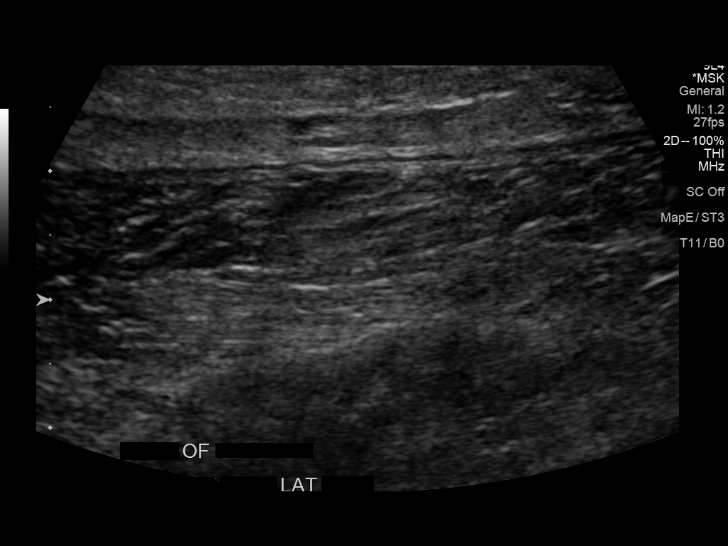

[14 of 24 positions shown; findings below may reference images not displayed]

FINDINGS: Limited sonographic evaluation was performed in the area of the
right posterior calf which demonstrated no evidence of mass, cyst or
fluid collection.
IMPRESSION: No sonographic abnormality seen in the area of concern in the right
calf.

## 2022-02-14 MED ORDER — RELION PREMIER TEST VI STRP: ORAL_STRIP | 3 refills | Status: DC

## 2022-02-14 NOTE — Telephone Encounter (Signed)
[facility] requested refill.  ?

## 2022-03-05 ENCOUNTER — Encounter: Payer: Self-pay | Admitting: Primary Care

## 2022-03-05 ENCOUNTER — Ambulatory Visit (INDEPENDENT_AMBULATORY_CARE_PROVIDER_SITE_OTHER): Payer: Medicare Other

## 2022-03-05 ENCOUNTER — Ambulatory Visit (INDEPENDENT_AMBULATORY_CARE_PROVIDER_SITE_OTHER): Payer: Medicare Other | Admitting: Primary Care

## 2022-03-05 DIAGNOSIS — J189 Pneumonia, unspecified organism: Secondary | ICD-10-CM | POA: Diagnosis not present

## 2022-03-05 DIAGNOSIS — J90 Pleural effusion, not elsewhere classified: Secondary | ICD-10-CM | POA: Diagnosis not present

## 2022-03-05 NOTE — Progress Notes (Signed)
? ?'@Patient'$  ID: Yolanda Burke, female    DOB: 09-07-1932, 88 y.o.   MRN: [id number] ? ?Chief Complaint  ?Patient presents with  ? Follow-up  ?  1 mo f/u for PNA. States she has been doing well since last visit.   ? ? ?Referring provider: ?Virgie Dad, MD ? ?HPI: ?88 year old female, former smoker quit 1986.  Past medical history significant for multifocal pneumonia, loculated pleural effusion, GERD, macular degeneration of both eyes, diverticulitis, chronic back pain, cholelithiasis, hypercholesterolemia, vocal tremor. ? ?Previous [facility] pulmonary encounter:  ?02/03/2022 ?Patient was admitted from 01/13/2022 - 01/20/2022 for community-acquired pneumonia.  Treated with Rocephin and azithromycin.  CT scan showed loculated pleural effusion, status post chest tube placed by pulmonary on 01/16/22. She received lytics to chest tube.  Cytology nondiagnostic, pleural fluid LDH 174.  Chest x-ray showed pleural effusion resolved.  Possible right upper lobe pulmonary nodule on CT scan, will need outpatient follow-up. ? ?She presents today for hospital follow-up. She lives at [facility]. She is feeling great, states that she can now take a deep breath. No residual shortness of breath or cough. She has some right posterior back pain similar to when she was in the hospital. Pain will cause muscle spasm. Repeat CXR today showed small volume RIGHT pleural effusion and linear opacities at the RIGHT lung base, favor atelectasis.She has been using IS and flutter valve several times a day. She had labs at [facility] after hospitalization, hgb was 11.4 and glucose was 118. When she tests her blood sugar at home it has been running <150.  ? ?Multifocal pneumonia ?- Resolved; Hospitalized for CAP with parapneumonic effusion in late March 2023, treated with Rocephin and Azithromycin. S/p chest tube with fibrinolytics. No malignant cells on cytology. No residual respiratory symptoms or cough. Repeat CXR showed linear opacities at right lung base  favoring atelectasis. Basic labs were normal.  Encourage deep breathing exercise.  ?  ?Loculated pleural effusion ?- Clinically improved; No residual shortness of breath. Mild right sided pleuritic pain similar to previous. Repeat CXR today showed small volume RIGHT pleural effusion. FU in 4 weeks with repeat CXR. If not resolved needs CT chest.  ? ? ?03/05/2022 - Interim hx  ?Patient presents today for 1 month follow-up. Patient was admitted in March 2023 for CAP with parapneumonic effusion. Treated with Rocephin and Azithromycin. S/p chest tube with fibrinolytic's. No malignant cells on cytology. Respiratory symptoms resolved. Repeat CXR showed linear opacities at right lung base favoring atelectasis, small volume right pleural effusion. Follow-up in 4 weeks, if not resolved needs CT chest.  ? ?She is doing well today. She has no residual respiratory symptoms. She is able to take deeper breaths. Continues using incentive spirometer daily as instructed. Denies shortness of breath, wheezing, cough, chest tightness or pain. Lower right back pain has improved, she is able to get in and out of bed without trouble.  ? ?Imaging: ?01/13/22 CTA >> No evidence of pulmonary embolism. Moderate multiloculated right pleural effusion. Right middle and lower lobe atelectasis versus infiltrate. No evidence of centrally obstructing mass or lymphadenopathy. ? ?Allergies  ?Allergen Reactions  ? Oysters [Shellfish Allergy] Swelling  ? Sulfa Antibiotics   ?  CANNOT REMEMBER REACTION  ? ? ?Immunization History  ?Administered Date(s) Administered  ? Fluad Quad(high Dose 65+) 08/30/2021  ? Influenza, High Dose Seasonal PF 08/05/2019, 08/31/2020  ? Influenza-Unspecified 10/27/2017  ? Moderna Sars-Covid-2 Vaccination 11/06/2019, 12/06/2019, 09/11/2020, 04/06/2021  ? ? ?Past Medical History:  ?Diagnosis Date  ? Allergy   ?  Arthritis   ? Back pain   ? Chronic cystitis with hematuria   ? Colon polyps   ? Cystocele, unspecified (CODE)   ?  Diverticulitis   ? Dyslipidemia   ? Gallstones   ? Gastric ulcer   ? Gastritis   ? GERD (gastroesophageal reflux disease)   ? Hearing loss   ? Hemorrhoids   ? Hyperlipidemia   ? Hypertension   ? Hypertensive retinopathy   ? OU  ? Impaired fasting glucose   ? Macular degeneration   ? Dry OU  ? Melanoma in situ of neck ([place])   ? Renal insufficiency   ? ? ?Tobacco History: ?Social History  ? ?Tobacco Use  ?Smoking Status Former  ? Types: Cigarettes  ? Quit date: 03/14/1985  ? Years since quitting: 37.0  ?Smokeless Tobacco Never  ?Tobacco Comments  ? Smoked until age 39  ? ?Counseling given: Not Answered ?Tobacco comments: Smoked until age 51 ? ? ?Outpatient Medications Prior to Visit  ?Medication Sig Dispense Refill  ? glucose blood (RELION PREMIER TEST) test strip Use to test blood sugar up to 3 times daily. Dx: E11.65 300 each 3  ? Multiple Vitamins-Minerals (ICAPS AREDS 2 PO) Take 1 tablet by mouth 2 (two) times daily.    ? pravastatin (PRAVACHOL) 20 MG tablet Take 1 tablet (20 mg total) by mouth daily. 30 tablet 3  ? ?No facility-administered medications prior to visit.  ? ? ? ? ?Review of Systems ? ?Review of Systems  ?Constitutional: Negative.   ?HENT: Negative.    ?Respiratory:  Negative for cough, chest tightness, shortness of breath and wheezing.   ?Cardiovascular: Negative.   ? ? ?Physical Exam ? ?BP 118/70   Pulse 63   Ht '5\' 1"'$  (1.549 m)   Wt 133 lb 9.6 oz (60.6 kg)   SpO2 95% Comment: on RA  BMI 25.24 kg/m?  ?Physical Exam ?Constitutional:   ?   Appearance: Normal appearance.  ?HENT:  ?   Head: Normocephalic and atraumatic.  ?   Mouth/Throat:  ?   Mouth: Mucous membranes are moist.  ?   Pharynx: Oropharynx is clear.  ?Cardiovascular:  ?   Rate and Rhythm: Normal rate and regular rhythm.  ?Pulmonary:  ?   Effort: Pulmonary effort is normal.  ?   Breath sounds: Normal breath sounds.  ?Skin: ?   General: Skin is warm and dry.  ?Neurological:  ?   General: No focal deficit present.  ?   Mental Status: She  is alert and oriented to person, place, and time. Mental status is at baseline.  ?Psychiatric:     ?   Mood and Affect: Mood normal.     ?   Behavior: Behavior normal.     ?   Thought Content: Thought content normal.     ?   Judgment: Judgment normal.  ?  ? ?Lab Results: ? ?CBC ?   ?Component Value Date/Time  ? WBC 6.4 02/03/2022 1019  ? RBC 4.19 02/03/2022 1019  ? HGB 11.9 (L) 02/03/2022 1019  ? HCT 35.7 (L) 02/03/2022 1019  ? PLT 384.0 02/03/2022 1019  ? MCV 85.2 02/03/2022 1019  ? MCH 28.2 01/20/2022 0430  ? MCHC 33.5 02/03/2022 1019  ? RDW 14.0 02/03/2022 1019  ? LYMPHSABS 0.8 01/15/2022 0512  ? MONOABS 0.7 01/15/2022 0512  ? EOSABS 0.0 01/15/2022 0512  ? BASOSABS 0.1 01/15/2022 0512  ? ? ?BMET ?   ?Component Value Date/Time  ? NA 140 02/03/2022  1019  ? NA 142 01/28/2022 0000  ? K 4.2 02/03/2022 1019  ? CL 104 02/03/2022 1019  ? CO2 25 02/03/2022 1019  ? GLUCOSE 159 (H) 02/03/2022 1019  ? BUN 12 02/03/2022 1019  ? BUN 10 01/28/2022 0000  ? CREATININE 0.83 02/03/2022 1019  ? CALCIUM 9.2 02/03/2022 1019  ? GFRNONAA >60 01/20/2022 0430  ? GFRAA >60 01/19/2018 1150  ? ? ?BNP ?   ?Component Value Date/Time  ? BNP 159.1 (H) 01/13/2022 1111  ? ? ?ProBNP ?No results found for: PROBNP ? ?Imaging: ?No results found. ? ? ?Assessment & Plan:  ? ?Multifocal pneumonia ?- Resolved; Hospitalized in March 2023 for CAP with parapneumonic effusion. Treated with Rocephin and Azithromycin. No residual respiratory symptoms.  ? ?Loculated pleural effusion ?- S/p chest tube with fibrinolytics. No malignant cells on cytology ?- Repeat CXR today  ? ? ?Martyn Ehrich, NP ?03/05/2022 ? ?

## 2022-03-05 NOTE — Patient Instructions (Addendum)
We will call you this afternoon with CXR results ?Notify office if shortness of breath worsens  ? ?Follow-up: ?4 months with Dr. Chase Caller (15 min slot ok)  ?

## 2022-03-05 NOTE — Assessment & Plan Note (Addendum)
-   Resolved; Hospitalized in March 2023 for CAP with parapneumonic effusion. Treated with Rocephin and Azithromycin. No residual respiratory symptoms.  ?

## 2022-03-05 NOTE — Assessment & Plan Note (Addendum)
-   S/p chest tube with fibrinolytics. No malignant cells on cytology ?- Repeat CXR today  ?

## 2022-03-07 NOTE — Progress Notes (Signed)
Please let patient know CXR was similar to previous, trace pleural effusion/scarring right lower lobe. Please order CT chest wo contrast re: right pleural effusion

## 2022-03-11 LAB — CBC: RBC: 4.78 (ref 3.87–5.11)

## 2022-03-11 LAB — COMPREHENSIVE METABOLIC PANEL: Calcium: 9.3 (ref 8.7–10.7)

## 2022-03-11 LAB — CBC AND DIFFERENTIAL
HCT: 40 (ref 36–46)
Hemoglobin: 13.3 (ref 12.0–16.0)
Platelets: 241 10*3/uL (ref 150–400)
WBC: 5

## 2022-03-11 LAB — LIPID PANEL
Cholesterol: 185 (ref 0–200)
HDL: 53 (ref 35–70)
LDL Cholesterol: 93
LDl/HDL Ratio: 3.5
Triglycerides: 194 — AB (ref 40–160)

## 2022-03-11 LAB — BASIC METABOLIC PANEL
BUN: 12 (ref 4–21)
CO2: 23 — AB (ref 13–22)
Chloride: 106 (ref 99–108)
Creatinine: 0.6 (ref 0.5–1.1)
Glucose: 116
Potassium: 4.4 mEq/L (ref 3.5–5.1)
Sodium: 143 (ref 137–147)

## 2022-03-17 ENCOUNTER — Other Ambulatory Visit: Payer: Self-pay | Admitting: *Deleted

## 2022-03-17 DIAGNOSIS — J90 Pleural effusion, not elsewhere classified: Secondary | ICD-10-CM

## 2022-03-18 ENCOUNTER — Encounter: Payer: Self-pay | Admitting: Internal Medicine

## 2022-03-19 ENCOUNTER — Non-Acute Institutional Stay: Payer: Medicare Other | Admitting: Internal Medicine

## 2022-03-19 ENCOUNTER — Encounter: Payer: Self-pay | Admitting: Internal Medicine

## 2022-03-19 VITALS — BP 146/86 | HR 79 | Temp 97.9°F | Ht 63.0 in | Wt 135.0 lb

## 2022-03-19 DIAGNOSIS — Z8701 Personal history of pneumonia (recurrent): Secondary | ICD-10-CM | POA: Diagnosis not present

## 2022-03-19 DIAGNOSIS — K5790 Diverticulosis of intestine, part unspecified, without perforation or abscess without bleeding: Secondary | ICD-10-CM

## 2022-03-19 DIAGNOSIS — E7800 Pure hypercholesterolemia, unspecified: Secondary | ICD-10-CM

## 2022-03-19 DIAGNOSIS — E1165 Type 2 diabetes mellitus with hyperglycemia: Secondary | ICD-10-CM | POA: Diagnosis not present

## 2022-03-19 NOTE — Patient Instructions (Signed)
Pneumonia PCV 20 and Covid Booster together Shingrix after 2 weeks

## 2022-03-20 NOTE — Progress Notes (Signed)
Location:  [place] of Service:  Clinic (12)  Provider:   Code Status:  Goals of Care:     03/18/2022    3:31 PM  Advanced Directives  Does Patient Have a Medical Advance Directive? Yes  Type of Paramedic of [place];Living will;Out of facility DNR (pink MOST or yellow form)  Does patient want to make changes to medical advance directive? No - Patient declined  Copy of [place] in Chart? Yes - validated most recent copy scanned in chart (See row information)  Pre-existing out of facility DNR order (yellow form or pink MOST form) Yellow form placed in chart (order not valid for inpatient use)     Chief Complaint  Patient presents with   Medical Management of Chronic Issues    Patient returns to the clinic for her 6 month follow up. Blood sugar in clinic 143.   Quality Metric Gaps    Verified NCIR and matrix patient is due for urine microalbumin, TDAP, Shingrix, PCV and dexa scan.    HPI: Patient is a 88 y.o. female seen today for medical management of chronic diseases.    Came for follow up  Patient was admitted in the hospital from 3/20 to 3/27 for community-acquired pneumonia Followed by Chest tube for Pleural effusion Sees Pulmonary Plan for repeat CT scan of Chest per Pulmonary She is doing well  No cough or SOB Is able to walk better now Did have some pain in her Lower Thoracic area. Resolves with Tylenol  She also was diagnosed with new onset diabetes and was started on metformin A1c was 6.7.   CBGs was more than 150 at that time she also gets got some Solu-Medrol  She never took Metformin Home CBS are 120-150. It was 143 in Clinic today Wants to continue with Diet control HLD Stopped her Statin as she says her body aches due to statin Her other issues h/o Diverticulosis Still has loose Stools sometimes Saw Dr Henrene Pastor in GI. Did not recommend Colonoscopy Gave her prescription of Cipro  to use of needed   Keratosis on Right Vernard Gambles Was seen by dermatology Macular Degeneration Arthritis H/o Hematuria. Hammer Toe in Right foot.  Past Medical History:  Diagnosis Date   Allergy    Arthritis    Back pain    Chronic cystitis with hematuria    Colon polyps    Cystocele, unspecified (CODE)    Diverticulitis    Dyslipidemia    Gallstones    Gastric ulcer    Gastritis    GERD (gastroesophageal reflux disease)    Hearing loss    Hemorrhoids    Hyperlipidemia    Hypertension    Hypertensive retinopathy    OU   Impaired fasting glucose    Macular degeneration    Dry OU   Melanoma in situ of neck ([place])    Renal insufficiency     Past Surgical History:  Procedure Laterality Date   APPENDECTOMY     CATARACT EXTRACTION Bilateral    Dr. Loni Dolly - [place], [place]     with appendectomy   EYE SURGERY Bilateral    Cat Sx - Dr. Loni Dolly - [place], [place]   THROAT SURGERY     calcified ligament   TONSILLECTOMY      Allergies  Allergen Reactions   Oysters [Shellfish Allergy] Swelling   Sulfa Antibiotics     CANNOT REMEMBER REACTION    Outpatient  Encounter Medications as of 03/19/2022  Medication Sig   glucose blood (RELION PREMIER TEST) test strip Use to test blood sugar up to 3 times daily. Dx: E11.65   Multiple Vitamins-Minerals (ICAPS AREDS 2 PO) Take 1 tablet by mouth 2 (two) times daily.   pravastatin (PRAVACHOL) 20 MG tablet Take 1 tablet (20 mg total) by mouth daily.   No facility-administered encounter medications on file as of 03/19/2022.    Review of Systems:  Review of Systems  Constitutional:  Negative for activity change and appetite change.  HENT: Negative.    Respiratory:  Negative for cough and shortness of breath.   Cardiovascular:  Negative for leg swelling.  Gastrointestinal:  Negative for constipation.  Genitourinary: Negative.   Musculoskeletal:  Positive for back pain. Negative for arthralgias, gait problem and myalgias.   Skin: Negative.   Neurological:  Negative for dizziness and weakness.  Psychiatric/Behavioral:  Negative for confusion, dysphoric mood and sleep disturbance.    Health Maintenance  Topic Date Due   URINE MICROALBUMIN  Never done   TETANUS/TDAP  Never done   Zoster Vaccines- Shingrix (1 of 2) Never done   Pneumonia Vaccine 34+ Years old (1 - PCV) Never done   DEXA SCAN  Never done   COVID-19 Vaccine (5 - Booster for Moderna series) 09/23/2022 (Originally 10/04/2021)   INFLUENZA VACCINE  05/27/2022   HPV VACCINES  Aged Out    Physical Exam: Vitals:   03/19/22 1002  BP: (!) 146/86  Pulse: 79  Temp: 97.9 F (36.6 C)  SpO2: 95%  Weight: 135 lb (61.2 kg)  Height: '5\' 3"'$  (1.6 m)   Body mass index is 23.91 kg/m. Physical Exam Vitals reviewed.  Constitutional:      Appearance: Normal appearance.  HENT:     Head: Normocephalic.     Nose: Nose normal.     Mouth/Throat:     Mouth: Mucous membranes are moist.     Pharynx: Oropharynx is clear.  Eyes:     Pupils: Pupils are equal, round, and reactive to light.  Cardiovascular:     Rate and Rhythm: Normal rate and regular rhythm.     Pulses: Normal pulses.     Heart sounds: Normal heart sounds. No murmur heard. Pulmonary:     Effort: Pulmonary effort is normal.     Breath sounds: Normal breath sounds.  Abdominal:     General: Abdomen is flat. Bowel sounds are normal.     Palpations: Abdomen is soft.  Musculoskeletal:        General: No swelling.     Cervical back: Neck supple.     Comments: Hammer toe of Right leg Mild Swelling bilateral  Skin:    General: Skin is warm.  Neurological:     General: No focal deficit present.     Mental Status: She is alert and oriented to person, place, and time.  Psychiatric:        Mood and Affect: Mood normal.        Thought Content: Thought content normal.    Labs reviewed: Basic Metabolic Panel: Recent Labs    03/21/21 0000 01/13/22 1033 01/13/22 1053 01/14/22 0446  01/15/22 0512 01/16/22 0439 01/19/22 0455 01/20/22 0430 01/28/22 0000 02/03/22 1019 03/11/22 0000  NA 141   < >  --  132* 135   < > 133* 134* 142 140 143  K 4.2   < >  --  3.9 4.1   < > 3.8 4.1 4.6 4.2 4.4  CL 105   < >  --  101 104   < > 103 103 105 104 106  CO2 24*   < >  --  21* 22   < > 23 24 25* 25 23*  GLUCOSE  --    < >  --  311* 173*   < > 205* 107*  --  159*  --   BUN 13   < >  --  13 17   < > '20 20 10 12 12  '$ CREATININE 0.7   < >  --  0.77 0.71   < > 0.67 0.69 0.6 0.83 0.6  CALCIUM 9.1   < >  --  8.4* 8.8*   < > 7.8* 8.1* 8.9 9.2 9.3  MG  --   --   --  2.0 2.0  --   --   --   --   --   --   PHOS  --   --   --  3.3 3.5  --   --   --   --   --   --   TSH 2.90  --  1.427  --   --   --   --   --   --   --   --    < > = values in this interval not displayed.   Liver Function Tests: Recent Labs    01/13/22 1033 01/14/22 0446 01/15/22 0512 01/16/22 1509  AST '21 21 25  '$ --   ALT '28 28 30  '$ --   ALKPHOS 65 66 58  --   BILITOT 0.6 0.4 0.1*  --   PROT 7.0 6.8 6.4* 6.6  ALBUMIN 3.2* 2.8* 2.6*  --    No results for input(s): LIPASE, AMYLASE in the last 8760 hours. No results for input(s): AMMONIA in the last 8760 hours. CBC: Recent Labs    01/13/22 1019 01/14/22 0446 01/15/22 0512 01/16/22 0439 01/19/22 0455 01/20/22 0430 01/28/22 0000 02/03/22 1019 03/11/22 0000  WBC 13.9* 10.5 20.9*   < > 10.6* 9.7 6.4 6.4 5.0  NEUTROABS 9.6* 8.7* 18.3*  --   --   --   --   --   --   HGB 11.7* 11.8* 10.8*   < > 10.6* 10.7* 11.4* 11.9* 13.3  HCT 35.6* 36.7 32.6*   < > 32.1* 32.1* 35* 35.7* 40  MCV 85.4 88.4 86.5   < > 86.5 84.7  --  85.2  --   PLT 365 316 363   < > 357 448* 343 384.0 241   < > = values in this interval not displayed.   Lipid Panel: Recent Labs    03/21/21 0000 03/11/22 0000  CHOL 150 185  HDL 44 53  LDLCALC 70 93  TRIG 182* 194*   Lab Results  Component Value Date   HGBA1C 6.7 (H) 01/14/2022    Procedures since last visit: DG Chest 2 View  Result  Date: 03/06/2022 CLINICAL DATA:  88 year old female with right pleural effusion EXAM: CHEST - 2 VIEW COMPARISON:  02/03/2022 FINDINGS: Cardiomediastinal silhouette unchanged in size and contour. Linear opacities in the right lung persist with no significant change. Blunting at the right costophrenic angle with small meniscus in the costophrenic sulcus on the lateral view. Thickening of the fissures on the lateral view is unchanged from prior. No interlobular septal thickening. No pneumothorax. No new confluent airspace disease. Degenerative changes of the spine. IMPRESSION: Similar appearance of the chest  x-ray, with trace right pleural fluid/scarring and associated right lower lung atelectasis/consolidation. Electronically Signed   By: Corrie Mckusick D.O.   On: 03/06/2022 13:15    Assessment/Plan 1. Type 2 diabetes mellitus with hyperglycemia, without long-term current use of insulin (HCC) Diet controlled CBGs less then 150 Repeat A1C next visit  2. H/O: pneumonia CT scan per Pulmonary pending  3. Diverticulosis Metamucil  4. Pure hypercholesterolemia Wants to try statin 3/week  Will take PCV 20 and Covid booster Will get shingrix later  Labs/tests ordered:  * No order type specified * Next appt:  07/21/2022

## 2022-04-09 ENCOUNTER — Other Ambulatory Visit: Payer: Medicare Other

## 2022-04-14 ENCOUNTER — Ambulatory Visit
Admission: RE | Admit: 2022-04-14 | Discharge: 2022-04-14 | Disposition: A | Discharge status: Home / Self Care | Payer: Medicare Other | Source: Ambulatory Visit | Attending: Primary Care | Admitting: Primary Care

## 2022-04-14 DIAGNOSIS — J90 Pleural effusion, not elsewhere classified: Secondary | ICD-10-CM

## 2022-05-09 ENCOUNTER — Encounter: Payer: Self-pay | Admitting: *Deleted

## 2022-06-10 NOTE — Progress Notes (Signed)
[facility] Note  06/18/2022    CHIEF COMPLAINT Patient presents for Retina Follow Up   HISTORY OF PRESENT ILLNESS: Yolanda Burke is a 88 y.o. female who presents to the clinic today for:   HPI     Retina Follow Up   Patient presents with  Dry AMD.  In both eyes.  Severity is moderate.  Duration of 6 months.  Since onset it is gradually worsening.  I, the attending physician,  performed the HPI with the patient and updated documentation appropriately.        Comments   Pt here for 6 mo ret f/u for non exu ARMD OU. Pt states VA has worsened gradually over the last 6 months. She did go to her annual eye exam, specs werent changed.       Last edited by Bernarda Caffey, MD on 06/18/2022 12:13 PM.     Referring physician: Virgie Dad, MD [place],  [place] [id number]  HISTORICAL INFORMATION:   Selected notes from the [place] Former pt at [facility] [facility], pt wants to switch retina specialists LEE:  Ocular Hx- PMH-    CURRENT MEDICATIONS: Current Outpatient Medications (Ophthalmic Drugs)  Medication Sig   prednisoLONE acetate (PRED FORTE) 1 % ophthalmic suspension Place 1 drop into the left eye 4 (four) times daily for 7 days.   No current facility-administered medications for this visit. (Ophthalmic Drugs)   Current Outpatient Medications (Other)  Medication Sig   glucose blood (RELION PREMIER TEST) test strip Use to test blood sugar up to 3 times daily. Dx: E11.65   Multiple Vitamins-Minerals (ICAPS AREDS 2 PO) Take 1 tablet by mouth 2 (two) times daily.   pravastatin (PRAVACHOL) 20 MG tablet Take 1 tablet (20 mg total) by mouth daily. (Patient not taking: Reported on 06/18/2022)   No current facility-administered medications for this visit. (Other)   REVIEW OF SYSTEMS: ROS   Positive for: Gastrointestinal, Skin, Genitourinary, Musculoskeletal, HENT, Eyes Negative for: Constitutional, Neurological, Endocrine,  Cardiovascular, Respiratory, Psychiatric, Allergic/Imm, Heme/Lymph Last edited by Kingsley Spittle, COT on 06/18/2022  9:07 AM.     ALLERGIES Allergies  Allergen Reactions   Oysters [Shellfish Allergy] Swelling   Sulfa Antibiotics     CANNOT REMEMBER REACTION   PAST MEDICAL HISTORY Past Medical History:  Diagnosis Date   Allergy    Arthritis    Back pain    Chronic cystitis with hematuria    Colon polyps    Cystocele, unspecified (CODE)    Diverticulitis    Dyslipidemia    Gallstones    Gastric ulcer    Gastritis    GERD (gastroesophageal reflux disease)    Hearing loss    Hemorrhoids    Hyperlipidemia    Hypertension    Hypertensive retinopathy    OU   Impaired fasting glucose    Macular degeneration    Dry OU   Melanoma in situ of neck ([place])    Renal insufficiency    Past Surgical History:  Procedure Laterality Date   APPENDECTOMY     CATARACT EXTRACTION Bilateral    Dr. Loni Dolly - [place], [place]     with appendectomy   EYE SURGERY Bilateral    Cat Sx - Dr. Loni Dolly - [place], [place]   THROAT SURGERY     calcified ligament   TONSILLECTOMY     FAMILY HISTORY Family History  Problem Relation Age of Onset   Heart  failure Mother    Dementia Mother    Pneumonia Father    Kidney failure Father    Heart disease Brother    Bladder Cancer Brother    SOCIAL HISTORY Social History   Tobacco Use   Smoking status: Former    Types: Cigarettes    Quit date: 03/14/1985    Years since quitting: 37.2   Smokeless tobacco: Never   Tobacco comments:    Smoked until age 44  Vaping Use   Vaping Use: Never used  Substance Use Topics   Alcohol use: Yes    Comment: social-occasional   Drug use: Never       OPHTHALMIC EXAM:  Base Eye Exam     Visual Acuity (Snellen - Linear)       Right Left   Dist cc 20/60 20/100 -1   Dist ph cc NI NI    Correction: Glasses         Tonometry (Tonopen, 9:14 AM)       Right Left   Pressure 14 13          Pupils       Dark Light Shape React APD   Right 3 2 Round Brisk None   Left 3 2 Round Brisk None         Visual Fields (Counting fingers)       Left Right    Full Full         Extraocular Movement       Right Left    Full, Ortho Full, Ortho         Neuro/Psych     Oriented x3: Yes   Mood/Affect: Normal         Dilation     Both eyes: 1.0% Mydriacyl, 2.5% Phenylephrine @ 9:14 AM           Slit Lamp and Fundus Exam     Slit Lamp Exam       Right Left   Lids/Lashes Dermatochalasis - upper lid Dermatochalasis - upper lid   Conjunctiva/Sclera White and quiet White and quiet   Cornea arcus, trace Punctate epithelial erosions, well healed cataract wound arcus, trace Punctate epithelial erosions, well healed cataract wound, tear film debris   Anterior Chamber Deep and quiet Deep and quiet   Iris Round and dilated Round and dilated   Lens Posterior chamber intraocular lens, 1+ non-central Posterior capsular opacification Posterior chamber intraocular lens, 1-2+non-central Posterior capsular opacification   Anterior Vitreous Vitreous syneresis Vitreous syneresis         Fundus Exam       Right Left   Disc Pink and Sharp, Compact Pink and Sharp, Compact   C/D Ratio 0.2 0.1   Macula Blunted foveal reflex, central GA with refractile subretinal deposits within, scattered drusen, linear heme IN mac -- resolved Flat, blunted foveal reflex, central GA with pigment ring, scattered drusen, no heme or edema   Vessels attenuated, Tortuous attenuated, Tortuous   Periphery Attached, no heme    Attached, no heme             Refraction     Wearing Rx       Sphere Cylinder Axis Add   Right -2.25 +2.00 015 +3.00   Left -2.25 +2.75 155 +3.00           IMAGING AND PROCEDURES  Imaging and Procedures for 06/18/2022  OCT, Retina - OU - Both Eyes       Right Eye Quality  was good. Central Foveal Thickness: 166. Progression has no prior data.  Findings include no IRF, no SRF, abnormal foveal contour, retinal drusen , subretinal hyper-reflective material, intraretinal hyper-reflective material, pigment epithelial detachment, outer retinal atrophy (Severe central thinning with SRHM / scarring ).   Left Eye Quality was good. Central Foveal Thickness: 100. Progression has no prior data. Findings include no IRF, no SRF, abnormal foveal contour, retinal drusen , pigment epithelial detachment, inner retinal atrophy, outer retinal atrophy (Severe central thinning / atrophy).   Notes *Images captured and stored on drive  Diagnosis / Impression:  non-exu ARMD OU OD: Severe central thinning with SRHM / scarring OS: Severe central thinning / atrophy  Clinical management:  See below  Abbreviations: NFP - Normal foveal profile. CME - cystoid macular edema. PED - pigment epithelial detachment. IRF - intraretinal fluid. SRF - subretinal fluid. EZ - ellipsoid zone. ERM - epiretinal membrane. ORA - outer retinal atrophy. ORT - outer retinal tubulation. SRHM - subretinal hyper-reflective material. IRHM - intraretinal hyper-reflective material      Yag Capsulotomy - OS - Left Eye       Laser Information The type of laser was yag.   Notes Procedure note: YAG Capsulotomy, Left Eye  Informed consent obtained. Pre-op dilating drops (1% Topicamide and 2.5% Phenylephrine), and topical anesthesia given. Power: 6.3 mJ Shots: 13 Posterior capsulotomy in cruciate formation performed without difficulty. Patient tolerated procedure well. No complications. Rx pred forte 4 times a day for 7 days, then stop. Pt received written and verbal post laser education. Recheck in 2 weeks w/ dilated exam.            ASSESSMENT/PLAN:    ICD-10-CM   1. Advanced atrophic nonexudative age-related macular degeneration of both eyes with subfoveal involvement  H35.3134 OCT, Retina - OU - Both Eyes    2. Essential hypertension  I10     3. Hypertensive  retinopathy of both eyes  H35.033     4. Pseudophakia of both eyes  Z96.1     5. PCO (posterior capsular opacification), left  H26.492 Yag Capsulotomy - OS - Left Eye     1. Age related macular degeneration, non-exudative, both eyes  - former pt at [facility]) and in [place]; moved from [place] in 2020)  - history of intravitreal injections with [facility] [employer], last being ~2016  - exam shows central GA OU, OD with central refractile drusen and scarring  - BCVA OD: 20/60, OS: 20/100 -- both stable  - Continue amsler grid monitoring  - f/u 6 months, DFE, OCT  2,3. Hypertensive retinopathy OU - discussed importance of tight BP control - monitor  4. Pseudophakia OU  - s/p CE/IOL OU (Dr. Loni Dolly, [place], [place])  - IOL in good position, doing well  - monitor  5. PCO OU  - visually significant OS -- pt reports subjective decrease in vision OS  - exam shows 2+ PCO OS -- now w/ central involvement  - recommend Yag Cap OS today, 08.23.23  - pt wishes to proceed with YAG laser  - RBA of procedure discussed, questions answered - informed consent obtained and signed - see procedure note - f/u 2 weeks, DFE, OCT  Ophthalmic Meds Ordered this visit:  Meds ordered this encounter  Medications   prednisoLONE acetate (PRED FORTE) 1 % ophthalmic suspension    Sig: Place 1 drop into the left eye 4 (four) times daily for 7 days.    Dispense:  10 mL  Refill:  0     Return in about 6 months (around 12/19/2022) for f/u non-exu ARMD OU, DFE, OCT.  There are no Patient Instructions on file for this visit.   Explained the diagnoses, plan, and follow up with the patient and they expressed understanding.  Patient expressed understanding of the importance of proper follow up care.   This document serves as a record of services personally performed by Gardiner Sleeper, MD, PhD. It was created on their behalf by Orvan Falconer, an ophthalmic technician. The creation of this record is the  provider's dictation and/or activities during the visit.    Electronically signed by: Orvan Falconer, OA, 06/18/22  12:15 PM  This document serves as a record of services personally performed by Gardiner Sleeper, MD, PhD. It was created on their behalf by San Jetty. Owens Shark, OA an ophthalmic technician. The creation of this record is the provider's dictation and/or activities during the visit.    Electronically signed by: San Jetty. Owens Shark, [place] 08.23.2023 12:15 PM  Gardiner Sleeper, M.D., Ph.D. Diseases & Surgery of the Retina and Vitreous [facility]  I have reviewed the above documentation for accuracy and completeness, and I agree with the above. Gardiner Sleeper, M.D., Ph.D. 06/18/22 12:17 PM   Abbreviations: M myopia (nearsighted); A astigmatism; H hyperopia (farsighted); P presbyopia; Mrx spectacle prescription;  CTL contact lenses; OD right eye; OS left eye; OU both eyes  XT exotropia; ET esotropia; PEK punctate epithelial keratitis; PEE punctate epithelial erosions; DES dry eye syndrome; MGD meibomian gland dysfunction; ATs artificial tears; PFAT's preservative free artificial tears; [place] nuclear sclerotic cataract; PSC posterior subcapsular cataract; ERM epi-retinal membrane; PVD posterior vitreous detachment; RD retinal detachment; DM diabetes mellitus; DR diabetic retinopathy; NPDR non-proliferative diabetic retinopathy; PDR proliferative diabetic retinopathy; CSME clinically significant macular edema; DME diabetic macular edema; dbh dot blot hemorrhages; CWS cotton wool spot; POAG primary open angle glaucoma; C/D cup-to-disc ratio; HVF humphrey visual field; GVF goldmann visual field; OCT optical coherence tomography; IOP intraocular pressure; BRVO Branch retinal vein occlusion; CRVO central retinal vein occlusion; CRAO central retinal artery occlusion; BRAO branch retinal artery occlusion; RT retinal tear; SB scleral buckle; PPV pars plana vitrectomy; VH Vitreous  hemorrhage; PRP panretinal laser photocoagulation; IVK intravitreal kenalog; VMT vitreomacular traction; MH Macular hole;  NVD neovascularization of the disc; NVE neovascularization elsewhere; AREDS age related eye disease study; ARMD age related macular degeneration; POAG primary open angle glaucoma; EBMD epithelial/anterior basement membrane dystrophy; ACIOL anterior chamber intraocular lens; IOL intraocular lens; PCIOL posterior chamber intraocular lens; Phaco/IOL phacoemulsification with intraocular lens placement; [place] photorefractive keratectomy; LASIK laser assisted in situ keratomileusis; HTN hypertension; DM diabetes mellitus; COPD chronic obstructive pulmonary disease

## 2022-06-18 ENCOUNTER — Encounter (INDEPENDENT_AMBULATORY_CARE_PROVIDER_SITE_OTHER): Payer: Self-pay | Admitting: Ophthalmology

## 2022-06-18 ENCOUNTER — Ambulatory Visit (INDEPENDENT_AMBULATORY_CARE_PROVIDER_SITE_OTHER): Payer: Medicare Other | Admitting: Ophthalmology

## 2022-06-18 DIAGNOSIS — H26492 Other secondary cataract, left eye: Secondary | ICD-10-CM | POA: Diagnosis not present

## 2022-06-18 DIAGNOSIS — Z961 Presence of intraocular lens: Secondary | ICD-10-CM | POA: Diagnosis not present

## 2022-06-18 DIAGNOSIS — H35033 Hypertensive retinopathy, bilateral: Secondary | ICD-10-CM

## 2022-06-18 DIAGNOSIS — H353134 Nonexudative age-related macular degeneration, bilateral, advanced atrophic with subfoveal involvement: Secondary | ICD-10-CM | POA: Diagnosis not present

## 2022-06-18 DIAGNOSIS — I10 Essential (primary) hypertension: Secondary | ICD-10-CM | POA: Diagnosis not present

## 2022-06-18 MED ORDER — PREDNISOLONE ACETATE 1 % OP SUSP: 1.0000 [drp] | Freq: Four times a day (QID) | OPHTHALMIC | 0 refills | Status: AC

## 2022-06-26 NOTE — Progress Notes (Signed)
[facility] Note  07/08/2022    CHIEF COMPLAINT Patient presents for Retina Follow Up   HISTORY OF PRESENT ILLNESS: Yolanda Burke is a 88 y.o. female who presents to the clinic today for:   HPI     Retina Follow Up   Patient presents with  Other.  In left eye.  Severity is moderate.  Duration of 3 weeks.  Since onset it is stable.  I, the attending physician,  performed the HPI with the patient and updated documentation appropriately.        Comments   Pt here for 3 wk ret f/u s/p YAG OS 8.23.23. Pt states no changes in [place]. Pt is happy w/ post YAG VA.       Last edited by Bernarda Caffey, MD on 07/08/2022 12:37 PM.      Referring physician: Virgie Dad, MD [place],  [place] [address]  HISTORICAL INFORMATION:   Selected notes from the MEDICAL RECORD NUMBER Former pt at [facility], pt wants to switch retina specialists LEE:  Ocular Hx- PMH-    CURRENT MEDICATIONS: No current outpatient medications on file. (Ophthalmic Drugs)   No current facility-administered medications for this visit. (Ophthalmic Drugs)   Current Outpatient Medications (Other)  Medication Sig   glucose blood (RELION PREMIER TEST) test strip Use to test blood sugar up to 3 times daily. Dx: E11.65   Multiple Vitamins-Minerals (ICAPS AREDS 2 PO) Take 1 tablet by mouth 2 (two) times daily.   pravastatin (PRAVACHOL) 20 MG tablet Take 1 tablet (20 mg total) by mouth daily. (Patient not taking: Reported on 06/18/2022)   No current facility-administered medications for this visit. (Other)   REVIEW OF SYSTEMS: ROS   Positive for: Gastrointestinal, Skin, Genitourinary, Musculoskeletal, HENT, Eyes Negative for: Constitutional, Neurological, Endocrine, Cardiovascular, Respiratory, Psychiatric, Allergic/Imm, Heme/Lymph Last edited by Kingsley Spittle, COT on 07/08/2022  9:09 AM.      ALLERGIES Allergies  Allergen Reactions   Oysters [Shellfish  Allergy] Swelling   Sulfa Antibiotics     CANNOT REMEMBER REACTION   PAST MEDICAL HISTORY Past Medical History:  Diagnosis Date   Allergy    Arthritis    Back pain    Chronic cystitis with hematuria    Colon polyps    Cystocele, unspecified (CODE)    Diverticulitis    Dyslipidemia    Gallstones    Gastric ulcer    Gastritis    GERD (gastroesophageal reflux disease)    Hearing loss    Hemorrhoids    Hyperlipidemia    Hypertension    Hypertensive retinopathy    OU   Impaired fasting glucose    Macular degeneration    Dry OU   Melanoma in situ of neck ([place])    Renal insufficiency    Past Surgical History:  Procedure Laterality Date   APPENDECTOMY     CATARACT EXTRACTION Bilateral    Dr. Loni Dolly - Evette Doffing, [place]     with appendectomy   EYE SURGERY Bilateral    Cat Sx - Dr. Loni Dolly - [facility]ette Doffing, [place]     calcified ligament   TONSILLECTOMY     FAMILY HISTORY Family History  Problem Relation Age of Onset   Heart failure Mother    Dementia Mother    Pneumonia Father    Kidney failure Father    Heart disease Brother    Bladder Cancer Brother    SOCIAL  HISTORY Social History   Tobacco Use   Smoking status: Former    Types: Cigarettes    Quit date: 03/14/1985    Years since quitting: 37.3   Smokeless tobacco: Never   Tobacco comments:    Smoked until age 23  Vaping Use   Vaping Use: Never used  Substance Use Topics   Alcohol use: Yes    Comment: social-occasional   Drug use: Never       OPHTHALMIC EXAM:  Base Eye Exam     Visual Acuity (Snellen - Linear)       Right Left   Dist cc 20/60 -2 20/80 -1   Dist ph cc NI NI    Correction: Glasses         Tonometry (Tonopen, 9:17 AM)       Right Left   Pressure 13 12         Pupils       Dark Light Shape React APD   Right 3 2 Round Brisk None   Left 3 2 Round Brisk None         Visual Fields (Counting fingers)       Left Right    Full Full          Extraocular Movement       Right Left    Full, Ortho Full, Ortho         Neuro/Psych     Oriented x3: Yes   Mood/Affect: Normal         Dilation     Both eyes: 1.0% Mydriacyl, 2.5% Phenylephrine @ 9:18 AM           Slit Lamp and Fundus Exam     Slit Lamp Exam       Right Left   Lids/Lashes Dermatochalasis - upper lid Dermatochalasis - upper lid   Conjunctiva/Sclera White and quiet White and quiet   Cornea arcus, trace Punctate epithelial erosions, well healed cataract wound arcus, trace Punctate epithelial erosions, well healed cataract wound, tear film debris   Anterior Chamber Deep and quiet Deep and quiet   Iris Round and dilated Round and dilated   Lens Posterior chamber intraocular lens, 1+ non-central Posterior capsular opacification Posterior chamber intraocular lens, open PC   Anterior Vitreous Vitreous syneresis Vitreous syneresis         Fundus Exam       Right Left   Disc Pink and Sharp, Compact Pink and Sharp, Compact   C/D Ratio 0.2 0.1   Macula Blunted foveal reflex, central GA with refractile subretinal deposits within, scattered drusen, linear heme IN mac -- resolved Flat, blunted foveal reflex, central GA with pigment ring, scattered drusen, no heme or edema   Vessels attenuated, Tortuous attenuated, Tortuous   Periphery Attached, no heme    Attached, no heme             Refraction     Wearing Rx       Sphere Cylinder Axis Add   Right -2.25 +2.00 015 +3.00   Left -2.25 +2.75 155 +3.00           IMAGING AND PROCEDURES  Imaging and Procedures for 07/08/2022  OCT, Retina - OU - Both Eyes       Right Eye Quality was good. Central Foveal Thickness: 168. Progression has been stable. Findings include no IRF, no SRF, abnormal foveal contour, retinal drusen , subretinal hyper-reflective material, intraretinal hyper-reflective material, pigment epithelial detachment, outer retinal atrophy (Severe  central thinning with SRHM /  scarring ).   Left Eye Quality was good. Central Foveal Thickness: 131. Progression has been stable. Findings include no IRF, no SRF, abnormal foveal contour, retinal drusen , pigment epithelial detachment, inner retinal atrophy, outer retinal atrophy (Severe central thinning / atrophy).   Notes *Images captured and stored on drive  Diagnosis / Impression:  non-exu ARMD OU OD: Severe central thinning with SRHM / scarring OS: Severe central thinning / atrophy  Clinical management:  See below  Abbreviations: NFP - Normal foveal profile. CME - cystoid macular edema. PED - pigment epithelial detachment. IRF - intraretinal fluid. SRF - subretinal fluid. EZ - ellipsoid zone. ERM - epiretinal membrane. ORA - outer retinal atrophy. ORT - outer retinal tubulation. SRHM - subretinal hyper-reflective material. IRHM - intraretinal hyper-reflective material      Yag Capsulotomy - OD - Right Eye       Time Out Confirmed correct patient, procedure, site, and patient consented.   Anesthesia Topical anesthesia was used.   Notes Procedure note: YAG Capsulotomy, RIGHT Eye  Informed consent obtained. Pre-op dilating drops (1% Topicamide and 2.5% Phenylephrine), and topical anesthesia given. Power: 6.5 mJ Shots: 14 Posterior capsulotomy in cruciate formation performed without difficulty. Patient tolerated procedure well. No complications. Rx pred forte 4 times a day for 7 days, then stop. Pt received written and verbal post laser education. Recheck in 2-3 weeks w/ dilated exam.            ASSESSMENT/PLAN:    ICD-10-CM   1. Advanced atrophic nonexudative age-related macular degeneration of both eyes with subfoveal involvement  H35.3134 OCT, Retina - OU - Both Eyes    2. Essential hypertension  I10     3. Hypertensive retinopathy of both eyes  H35.033     4. Pseudophakia of both eyes  Z96.1     5. PCO (posterior capsular opacification), bilateral  H26.493 Yag Capsulotomy - OD -  Right Eye     1. Age related macular degeneration, non-exudative, both eyes  - former pt at [facility]) and in [place]; moved from [place] in 2020)  - history of intravitreal injections with [facility] [employer], last being ~2016  - exam shows central GA OU, OD with central refractile drusen and scarring  - BCVA OD: 20/60, OS: 20/80 -- both stable  - Continue amsler grid monitoring  - f/u 6 months, DFE, OCT  2,3. Hypertensive retinopathy OU - discussed importance of tight BP control -  continue to monitor  4. Pseudophakia OU  - s/p CE/IOL OU (Dr. Loni Dolly, [place], [place])  - IOL in good position, doing well  - continue to monitor  5. PCO OU  - s/p Yag Cap OS today, 08.23.23 -- BCVA improved to 20/80 from 20/100  - recommend Yag Cap OD today, 09.12.23  - pt wishes to proceed with YAG laser in the right eye  - RBA of procedure discussed, questions answered - informed consent obtained and signed - see procedure note - f/u 2-3 weeks, DFE, OCT  Ophthalmic Meds Ordered this visit:  No orders of the defined types were placed in this encounter.    Return in about 2 weeks (around 07/22/2022) for 2-3 weeks s/p YAG OD. Marland Kitchen  There are no Patient Instructions on file for this visit.   Explained the diagnoses, plan, and follow up with the patient and they expressed understanding.  Patient expressed understanding of the importance of proper follow up care.   This document serves as  a record of services personally performed by Gardiner Sleeper, MD, PhD. It was created on their behalf by Renaldo Reel, [place] an ophthalmic technician. The creation of this record is the provider's dictation and/or activities during the visit.    Electronically signed by:  Renaldo Reel, COT  07/08/22  12:39 PM  Gardiner Sleeper, M.D., Ph.D. Diseases & Surgery of the Retina and Vitreous [facility]  I have reviewed the above documentation for accuracy and completeness, and I  agree with the above. Gardiner Sleeper, M.D., Ph.D. 07/08/22 12:42 PM   Abbreviations: M myopia (nearsighted); A astigmatism; H hyperopia (farsighted); P presbyopia; Mrx spectacle prescription;  CTL contact lenses; OD right eye; OS left eye; OU both eyes  XT exotropia; ET esotropia; PEK punctate epithelial keratitis; PEE punctate epithelial erosions; DES dry eye syndrome; MGD meibomian gland dysfunction; ATs artificial tears; PFAT's preservative free artificial tears; [place] nuclear sclerotic cataract; PSC posterior subcapsular cataract; ERM epi-retinal membrane; PVD posterior vitreous detachment; RD retinal detachment; DM diabetes mellitus; DR diabetic retinopathy; NPDR non-proliferative diabetic retinopathy; PDR proliferative diabetic retinopathy; CSME clinically significant macular edema; DME diabetic macular edema; dbh dot blot hemorrhages; CWS cotton wool spot; POAG primary open angle glaucoma; C/D cup-to-disc ratio; HVF humphrey visual field; GVF goldmann visual field; OCT optical coherence tomography; IOP intraocular pressure; BRVO Branch retinal vein occlusion; CRVO central retinal vein occlusion; CRAO central retinal artery occlusion; BRAO branch retinal artery occlusion; RT retinal tear; SB scleral buckle; PPV pars plana vitrectomy; VH Vitreous hemorrhage; PRP panretinal laser photocoagulation; IVK intravitreal kenalog; VMT vitreomacular traction; MH Macular hole;  NVD neovascularization of the disc; NVE neovascularization elsewhere; AREDS age related eye disease study; ARMD age related macular degeneration; POAG primary open angle glaucoma; EBMD epithelial/anterior basement membrane dystrophy; ACIOL anterior chamber intraocular lens; IOL intraocular lens; PCIOL posterior chamber intraocular lens; Phaco/IOL phacoemulsification with intraocular lens placement; [place] photorefractive keratectomy; LASIK laser assisted in situ keratomileusis; HTN hypertension; DM diabetes mellitus; COPD chronic obstructive  pulmonary disease

## 2022-07-08 ENCOUNTER — Ambulatory Visit (INDEPENDENT_AMBULATORY_CARE_PROVIDER_SITE_OTHER): Payer: Medicare Other | Admitting: Ophthalmology

## 2022-07-08 ENCOUNTER — Encounter (INDEPENDENT_AMBULATORY_CARE_PROVIDER_SITE_OTHER): Payer: Self-pay | Admitting: Ophthalmology

## 2022-07-08 DIAGNOSIS — I10 Essential (primary) hypertension: Secondary | ICD-10-CM | POA: Diagnosis not present

## 2022-07-08 DIAGNOSIS — H26493 Other secondary cataract, bilateral: Secondary | ICD-10-CM

## 2022-07-08 DIAGNOSIS — H35033 Hypertensive retinopathy, bilateral: Secondary | ICD-10-CM | POA: Diagnosis not present

## 2022-07-08 DIAGNOSIS — Z961 Presence of intraocular lens: Secondary | ICD-10-CM | POA: Diagnosis not present

## 2022-07-08 DIAGNOSIS — H353134 Nonexudative age-related macular degeneration, bilateral, advanced atrophic with subfoveal involvement: Secondary | ICD-10-CM

## 2022-07-08 DIAGNOSIS — H26492 Other secondary cataract, left eye: Secondary | ICD-10-CM

## 2022-07-15 LAB — LIPID PANEL
Cholesterol: 220 — AB (ref 0–200)
HDL: 57 (ref 35–70)
LDL Cholesterol: 131
Triglycerides: 156 (ref 40–160)

## 2022-07-15 LAB — BASIC METABOLIC PANEL
BUN: 14 (ref 4–21)
CO2: 23 — AB (ref 13–22)
Chloride: 105 (ref 99–108)
Creatinine: 0.7 (ref 0.5–1.1)
Glucose: 116
Potassium: 4.2 mEq/L (ref 3.5–5.1)
Sodium: 141 (ref 137–147)

## 2022-07-15 LAB — HEMOGLOBIN A1C: Hemoglobin A1C: 6.2

## 2022-07-15 LAB — COMPREHENSIVE METABOLIC PANEL: Calcium: 9 (ref 8.7–10.7)

## 2022-07-17 NOTE — Progress Notes (Signed)
[facility] Note  07/29/2022    CHIEF COMPLAINT Patient presents for Retina Follow Up   HISTORY OF PRESENT ILLNESS: Yolanda Burke is a 88 y.o. female who presents to the clinic today for:   HPI     Retina Follow Up   Patient presents with  Other.  In right eye.  This started 2 weeks ago.  I, the attending physician,  performed the HPI with the patient and updated documentation appropriately.        Comments   Patient here for 2 weeks retina follow up for s/p Yag OD. Patient states vision doing well. No eye pain. Has eye itch. Not all the time. Uses gtts.       Last edited by Bernarda Caffey, MD on 07/29/2022 12:47 PM.    Pt states vision has improved since Yag, no fol or floaters   Referring physician: Virgie Dad, MD [place],  [place] [address]  HISTORICAL INFORMATION:   Selected notes from the MEDICAL RECORD NUMBER Former pt at [facility], pt wants to switch retina specialists LEE:  Ocular Hx- PMH-    CURRENT MEDICATIONS: No current outpatient medications on file. (Ophthalmic Drugs)   No current facility-administered medications for this visit. (Ophthalmic Drugs)   Current Outpatient Medications (Other)  Medication Sig   glucose blood (RELION PREMIER TEST) test strip Use to test blood sugar up to 3 times daily. Dx: E11.65   Multiple Vitamins-Minerals (ICAPS AREDS 2 PO) Take 1 tablet by mouth 2 (two) times daily.   No current facility-administered medications for this visit. (Other)   REVIEW OF SYSTEMS: ROS   Positive for: Gastrointestinal, Skin, Genitourinary, Musculoskeletal, HENT, Eyes Negative for: Constitutional, Neurological, Endocrine, Cardiovascular, Respiratory, Psychiatric, Allergic/Imm, Heme/Lymph Last edited by Theodore Demark, COA on 07/29/2022  9:08 AM.     ALLERGIES Allergies  Allergen Reactions   Oysters [Shellfish Allergy] Swelling   Sulfa Antibiotics     CANNOT REMEMBER REACTION    PAST MEDICAL HISTORY Past Medical History:  Diagnosis Date   Allergy    Arthritis    Back pain    Chronic cystitis with hematuria    Colon polyps    Cystocele, unspecified (CODE)    Diverticulitis    Dyslipidemia    Gallstones    Gastric ulcer    Gastritis    GERD (gastroesophageal reflux disease)    Hearing loss    Hemorrhoids    Hyperlipidemia    Hypertension    Hypertensive retinopathy    OU   Impaired fasting glucose    Macular degeneration    Dry OU   Melanoma in situ of neck ([place])    Renal insufficiency    Past Surgical History:  Procedure Laterality Date   APPENDECTOMY     CATARACT EXTRACTION Bilateral    Dr. Loni Dolly - Evette Doffing, [place]     with appendectomy   EYE SURGERY Bilateral    Cat Sx - Dr. Loni Dolly - Evette Doffing, [place]     calcified ligament   TONSILLECTOMY     FAMILY HISTORY Family History  Problem Relation Age of Onset   Heart failure Mother    Dementia Mother    Pneumonia Father    Kidney failure Father    Heart disease Brother    Bladder Cancer Brother    SOCIAL HISTORY Social History   Tobacco Use   Smoking status: Former    Types:  Cigarettes    Quit date: 03/14/1985    Years since quitting: 37.4   Smokeless tobacco: Never   Tobacco comments:    Smoked until age 56  Vaping Use   Vaping Use: Never used  Substance Use Topics   Alcohol use: Yes    Comment: social-occasional   Drug use: Never       OPHTHALMIC EXAM:  Base Eye Exam     Visual Acuity (Snellen - Linear)       Right Left   Dist cc 20/50 20/70 +1   Dist ph cc NI 20/60 -1    Correction: Glasses         Tonometry (Tonopen, 9:05 AM)       Right Left   Pressure 14 12         Pupils       Dark Light Shape React APD   Right 3 2 Round Brisk None   Left 3 2 Round Brisk None         Visual Fields (Counting fingers)       Left Right    Full Full         Extraocular Movement       Right Left    Full, Ortho Full,  Ortho         Neuro/Psych     Oriented x3: Yes   Mood/Affect: Normal         Dilation     Both eyes: 1.0% Mydriacyl, 2.5% Phenylephrine @ 9:05 AM           Slit Lamp and Fundus Exam     Slit Lamp Exam       Right Left   Lids/Lashes Dermatochalasis - upper lid Dermatochalasis - upper lid   Conjunctiva/Sclera White and quiet White and quiet   Cornea arcus, trace Punctate epithelial erosions, well healed cataract wound arcus, trace Punctate epithelial erosions, well healed cataract wound   Anterior Chamber Deep and quiet Deep and quiet   Iris Round and dilated Round and dilated   Lens PC IOL in good position with open PC PC IOL in good position with open PC   Anterior Vitreous Vitreous syneresis Vitreous syneresis, silicone micro bubbles         Fundus Exam       Right Left   Disc mild Pallor, Sharp rim, Compact Pink and Sharp, Compact   C/D Ratio 0.2 0.1   Macula Blunted foveal reflex, central GA with refractile subretinal deposits within, scattered drusen, no heme Flat, blunted foveal reflex, central GA with pigment ring, scattered drusen, no heme or edema   Vessels attenuated, Tortuous attenuated, Tortuous   Periphery Attached, no heme, No RT/RD Attached, no heme             Refraction     Wearing Rx       Sphere Cylinder Axis Add   Right -2.25 +2.00 015 +3.00   Left -2.25 +2.75 155 +3.00           IMAGING AND PROCEDURES  Imaging and Procedures for 07/29/2022  OCT, Retina - OU - Both Eyes       Right Eye Quality was good. Central Foveal Thickness: 169. Progression has been stable. Findings include no IRF, no SRF, abnormal foveal contour, retinal drusen , subretinal hyper-reflective material, intraretinal hyper-reflective material, pigment epithelial detachment, outer retinal atrophy (Severe central thinning with SRHM / scarring ).   Left Eye Quality was good. Central Foveal Thickness: 91. Progression  has been stable. Findings include no IRF,  no SRF, abnormal foveal contour, retinal drusen , pigment epithelial detachment, inner retinal atrophy, outer retinal atrophy (Severe central thinning / atrophy).   Notes *Images captured and stored on drive  Diagnosis / Impression:  non-exu ARMD OU OD: Severe central thinning with SRHM / scarring OS: Severe central thinning / atrophy  Clinical management:  See below  Abbreviations: NFP - Normal foveal profile. CME - cystoid macular edema. PED - pigment epithelial detachment. IRF - intraretinal fluid. SRF - subretinal fluid. EZ - ellipsoid zone. ERM - epiretinal membrane. ORA - outer retinal atrophy. ORT - outer retinal tubulation. SRHM - subretinal hyper-reflective material. IRHM - intraretinal hyper-reflective material            ASSESSMENT/PLAN:    ICD-10-CM   1. Advanced atrophic nonexudative age-related macular degeneration of both eyes with subfoveal involvement  H35.3134 OCT, Retina - OU - Both Eyes    2. Essential hypertension  I10     3. Hypertensive retinopathy of both eyes  H35.033     4. Pseudophakia of both eyes  Z96.1     5. PCO (posterior capsular opacification), left  H26.492      1. Age related macular degeneration, non-exudative, both eyes  - former pt at [facility]) and in [place]; moved from [place] in 2020)  - history of intravitreal injections with [facility], last being ~2016  - exam shows central GA OU, OD with central refractile drusen and scarring  - BCVA OD: 20/50, OS: 20/60 -- both improved (post YAG)  - Continue amsler grid monitoring  - f/u 6 months, DFE, OCT  2,3. Hypertensive retinopathy OU - discussed importance of tight BP control -  continue to monitor  4. Pseudophakia OU  - s/p CE/IOL OU (Dr. Loni Dolly, [place], [place])  - IOL in good position, doing well  - continue to monitor  5. PCO OU  - s/p Yag Cap OS (08.23.23) -- BCVA improved to 20/60 from 20/100  - s/p Yag Cap OD (09.12.23) -- BCVA improved to 20/50 from  20/60 - monitor  Ophthalmic Meds Ordered this visit:  No orders of the defined types were placed in this encounter.    Return in about 6 months (around 01/28/2023) for f/u non-exu ARMD OU, DFE, OCT.  There are no Patient Instructions on file for this visit.   Explained the diagnoses, plan, and follow up with the patient and they expressed understanding.  Patient expressed understanding of the importance of proper follow up care.   This document serves as a record of services personally performed by Gardiner Sleeper, MD, PhD. It was created on their behalf by Renaldo Reel, [place] an ophthalmic technician. The creation of this record is the provider's dictation and/or activities during the visit.    Electronically signed by:  Renaldo Reel, COT  07/17/22  12:48 PM  This document serves as a record of services personally performed by Gardiner Sleeper, MD, PhD. It was created on their behalf by San Jetty. Owens Shark, OA an ophthalmic technician. The creation of this record is the provider's dictation and/or activities during the visit.    Electronically signed by: San Jetty. Owens Shark, [place] 10.03.2023 12:48 PM   Gardiner Sleeper, M.D., Ph.D. Diseases & Surgery of the Retina and Vitreous [facility]  I have reviewed the above documentation for accuracy and completeness, and I agree with the above. Gardiner Sleeper, M.D., Ph.D. 07/29/22 12:53 PM  Abbreviations: M myopia (nearsighted); A astigmatism; H hyperopia (farsighted); P presbyopia; Mrx spectacle prescription;  CTL contact lenses; OD right eye; OS left eye; OU both eyes  XT exotropia; ET esotropia; PEK punctate epithelial keratitis; PEE punctate epithelial erosions; DES dry eye syndrome; MGD meibomian gland dysfunction; ATs artificial tears; PFAT's preservative free artificial tears; [place] nuclear sclerotic cataract; PSC posterior subcapsular cataract; ERM epi-retinal membrane; PVD posterior vitreous detachment; RD retinal  detachment; DM diabetes mellitus; DR diabetic retinopathy; NPDR non-proliferative diabetic retinopathy; PDR proliferative diabetic retinopathy; CSME clinically significant macular edema; DME diabetic macular edema; dbh dot blot hemorrhages; CWS cotton wool spot; POAG primary open angle glaucoma; C/D cup-to-disc ratio; HVF humphrey visual field; GVF goldmann visual field; OCT optical coherence tomography; IOP intraocular pressure; BRVO Branch retinal vein occlusion; CRVO central retinal vein occlusion; CRAO central retinal artery occlusion; BRAO branch retinal artery occlusion; RT retinal tear; SB scleral buckle; PPV pars plana vitrectomy; VH Vitreous hemorrhage; PRP panretinal laser photocoagulation; IVK intravitreal kenalog; VMT vitreomacular traction; MH Macular hole;  NVD neovascularization of the disc; NVE neovascularization elsewhere; AREDS age related eye disease study; ARMD age related macular degeneration; POAG primary open angle glaucoma; EBMD epithelial/anterior basement membrane dystrophy; ACIOL anterior chamber intraocular lens; IOL intraocular lens; PCIOL posterior chamber intraocular lens; Phaco/IOL phacoemulsification with intraocular lens placement; [place] photorefractive keratectomy; LASIK laser assisted in situ keratomileusis; HTN hypertension; DM diabetes mellitus; COPD chronic obstructive pulmonary disease

## 2022-07-21 ENCOUNTER — Non-Acute Institutional Stay: Payer: Medicare Other | Admitting: Adult Health

## 2022-07-21 ENCOUNTER — Encounter: Payer: Self-pay | Admitting: Adult Health

## 2022-07-21 VITALS — BP 118/78 | HR 77 | Temp 97.6°F | Ht 63.0 in | Wt 137.6 lb

## 2022-07-21 DIAGNOSIS — E1165 Type 2 diabetes mellitus with hyperglycemia: Secondary | ICD-10-CM

## 2022-07-21 DIAGNOSIS — R498 Other voice and resonance disorders: Secondary | ICD-10-CM

## 2022-07-21 DIAGNOSIS — E7800 Pure hypercholesterolemia, unspecified: Secondary | ICD-10-CM

## 2022-07-21 DIAGNOSIS — I872 Venous insufficiency (chronic) (peripheral): Secondary | ICD-10-CM | POA: Diagnosis not present

## 2022-07-21 DIAGNOSIS — Z87448 Personal history of other diseases of urinary system: Secondary | ICD-10-CM

## 2022-07-21 DIAGNOSIS — H3530 Unspecified macular degeneration: Secondary | ICD-10-CM

## 2022-07-21 DIAGNOSIS — N3946 Mixed incontinence: Secondary | ICD-10-CM

## 2022-07-21 NOTE — Progress Notes (Unsigned)
Location [facility]  POS: Clinic   Provider:   Cindi Carbon, [place] [phone]   Virgie Dad, MD  Patient Care Team: Virgie Dad, MD as PCP - General (Internal Medicine)  Extended Emergency Contact Information Primary Emergency Contact: Dignan,Wilmot III Address: [address]          [place], [place] [address] Johnnette Litter of [place] Phone: [phone] Mobile Phone: [phone] Relation: Son Secondary Emergency Contact: [place] Phone: [phone] Mobile Phone: [phone] Relation: Son  Code Status:   Goals of care: Advanced Directive information    03/18/2022    3:31 PM  Advanced Directives  Does Patient Have a Medical Advance Directive? Yes  Type of Paramedic of [place];Living will;Out of facility DNR (pink MOST or yellow form)  Does patient want to make changes to medical advance directive? No - Patient declined  Copy of [place] in Chart? Yes - validated most recent copy scanned in chart (See row information)  Pre-existing out of facility DNR order (yellow form or pink MOST form) Yellow form placed in chart (order not valid for inpatient use)     Chief Complaint  Patient presents with   Medical Management of Chronic Issues    4 Month Follow up and labs. To discuss need for tdap,Dexascan and Flu or postpone if patient refuses. NCIR Verified.     HPI:  Pt is a 88 y.o. female seen today for medical management of chronic diseases.   PMH significant for diverticulosis, loose stools, HLD, macular degeneration, HLD, arthritis, hammer toe, chronic cystitis, gastric ulcer, gallstones, HTN, GERD, hearing loss .  LDL 70 5/22, triglycerides 182, statin changed to QOD per pt request in May of 2022. She was having some joint pain and that has improved.   She has chronic edema in the RLE which has not changed. Ultra sound of RLE in Jan of 2022 showed no acute process.    Sees urology once a year due to prior hx of hematuria. No dysuria or bladder pain. Has some leakage.  .  Walks 25 miles this past week Plays golf.  Has not had any issues with diverticulitis. No need to take antibiotics.   Had UA done a few months ago but its not in epic.   Hx of diverticulitis   Past Medical History:  Diagnosis Date   Allergy    Arthritis    Back pain    Chronic cystitis with hematuria    Colon polyps    Cystocele, unspecified (CODE)    Diverticulitis    Dyslipidemia    Gallstones    Gastric ulcer    Gastritis    GERD (gastroesophageal reflux disease)    Hearing loss    Hemorrhoids    Hyperlipidemia    Hypertension    Hypertensive retinopathy    OU   Impaired fasting glucose    Macular degeneration    Dry OU   Melanoma in situ of neck ([place])    Renal insufficiency    Past Surgical History:  Procedure Laterality Date   APPENDECTOMY     CATARACT EXTRACTION Bilateral    Dr. Loni Dolly - [place], [place]     with appendectomy   EYE SURGERY Bilateral    Cat Sx - Dr. Loni Dolly - [place], [place]     calcified ligament   TONSILLECTOMY      Allergies  Allergen Reactions   Oysters [  Shellfish Allergy] Swelling   Sulfa Antibiotics     CANNOT REMEMBER REACTION    Outpatient Encounter Medications as of 07/21/2022  Medication Sig   glucose blood (RELION PREMIER TEST) test strip Use to test blood sugar up to 3 times daily. Dx: E11.65   Multiple Vitamins-Minerals (ICAPS AREDS 2 PO) Take 1 tablet by mouth 2 (two) times daily.   [DISCONTINUED] pravastatin (PRAVACHOL) 20 MG tablet Take 1 tablet (20 mg total) by mouth daily. (Patient not taking: Reported on 06/18/2022)   No facility-administered encounter medications on file as of 07/21/2022.    Review of Systems  Constitutional:  Negative for activity change, appetite change, chills, diaphoresis, fatigue, fever and unexpected weight change.  HENT:  Negative for congestion.    Respiratory:  Negative for cough, shortness of breath and wheezing.   Cardiovascular:  Positive for leg swelling (slight on right which is chronic). Negative for chest pain and palpitations.  Gastrointestinal:  Negative for abdominal distention, abdominal pain, constipation and diarrhea.  Genitourinary:  Negative for difficulty urinating and dysuria.  Musculoskeletal:  Negative for arthralgias, back pain, gait problem, joint swelling and myalgias.  Neurological:  Negative for dizziness, tremors, seizures, syncope, facial asymmetry, speech difficulty, weakness, light-headedness, numbness and headaches.  Psychiatric/Behavioral:  Negative for agitation, behavioral problems and confusion.     Immunization History  Administered Date(s) Administered   Fluad Quad(high Dose 65+) 08/30/2021   Influenza, High Dose Seasonal PF 08/05/2019, 08/31/2020   Influenza-Unspecified 10/27/2017   Moderna SARS-COV2 Booster Vaccination 09/11/2020, 08/09/2021   Moderna Sars-Covid-2 Vaccination 11/06/2019, 12/06/2019, 09/11/2020, 04/06/2021, 04/06/2021   PNEUMOCOCCAL CONJUGATE-20 06/07/2022   Zoster Recombinat (Shingrix) 06/07/2022   Pertinent  Health Maintenance Due  Topic Date Due   INFLUENZA VACCINE  05/27/2022   DEXA SCAN  07/22/2023 (Originally 11/04/1998)      01/17/2022    8:36 PM 01/18/2022   11:00 PM 01/19/2022   10:00 PM 01/20/2022    7:36 AM 03/18/2022    3:31 PM  Fall Risk  Falls in the past year?     0  Was there an injury with Fall?     0  Fall Risk Category Calculator     0  Fall Risk Category     Low  Patient Fall Risk Level Moderate fall risk Moderate fall risk Moderate fall risk Moderate fall risk Low fall risk  Patient at Risk for Falls Due to     No Fall Risks  Fall risk Follow up     Falls evaluation completed   Functional Status Survey:    Vitals:   07/21/22 1347  BP: 118/78  Pulse: 77  Temp: 97.6 F (36.4 C)  SpO2: 97%  Weight: 137 lb 9.6 oz (62.4 kg)  Height: '5\' 3"'$  (1.6 m)    Body mass index is 24.37 kg/m.  Physical Exam Vitals and nursing note reviewed.  Constitutional:      General: She is not in acute distress.    Appearance: She is not diaphoretic.  HENT:     Head: Normocephalic and atraumatic.     Right Ear: Tympanic membrane and external ear normal.     Left Ear: Tympanic membrane and external ear normal.     Nose: No rhinorrhea.     Mouth/Throat:     Mouth: Mucous membranes are moist.     Pharynx: Oropharynx is clear.  Eyes:     Conjunctiva/sclera: Conjunctivae normal.     Pupils: Pupils are equal, round, and reactive to light.  Neck:  Vascular: No JVD.  Cardiovascular:     Rate and Rhythm: Normal rate and regular rhythm.     Heart sounds: No murmur heard. Pulmonary:     Effort: Pulmonary effort is normal. No respiratory distress.     Breath sounds: Normal breath sounds. No wheezing.  Abdominal:     General: Bowel sounds are normal. There is no distension.     Palpations: Abdomen is soft.  Musculoskeletal:     Cervical back: No rigidity or tenderness.     Right lower leg: Edema (trace) present.     Left lower leg: No edema.  Lymphadenopathy:     Cervical: No cervical adenopathy.  Skin:    General: Skin is warm and dry.  Neurological:     Mental Status: She is alert and oriented to person, place, and time.  Psychiatric:        Mood and Affect: Mood normal.     Labs reviewed: Recent Labs    01/14/22 0446 01/15/22 0512 01/16/22 0439 01/19/22 0455 01/20/22 0430 01/28/22 0000 02/03/22 1019 03/11/22 0000 07/15/22 0000  NA 132* 135   < > 133* 134*   < > 140 143 141  K 3.9 4.1   < > 3.8 4.1   < > 4.2 4.4 4.2  CL 101 104   < > 103 103   < > 104 106 105  CO2 21* 22   < > 23 24   < > 25 23* 23*  GLUCOSE 311* 173*   < > 205* 107*  --  159*  --   --   BUN 13 17   < > 20 20   < > '12 12 14  '$ CREATININE 0.77 0.71   < > 0.67 0.69   < > 0.83 0.6 0.7  CALCIUM 8.4* 8.8*   < > 7.8* 8.1*   < > 9.2 9.3 9.0  MG 2.0 2.0  --   --   --    --   --   --   --   PHOS 3.3 3.5  --   --   --   --   --   --   --    < > = values in this interval not displayed.   Recent Labs    01/13/22 1033 01/14/22 0446 01/15/22 0512 01/16/22 1509  AST '21 21 25  '$ --   ALT '28 28 30  '$ --   ALKPHOS 65 66 58  --   BILITOT 0.6 0.4 0.1*  --   PROT 7.0 6.8 6.4* 6.6  ALBUMIN 3.2* 2.8* 2.6*  --    Recent Labs    01/13/22 1019 01/14/22 0446 01/15/22 0512 01/16/22 0439 01/19/22 0455 01/20/22 0430 01/28/22 0000 02/03/22 1019 03/11/22 0000  WBC 13.9* 10.5 20.9*   < > 10.6* 9.7 6.4 6.4 5.0  NEUTROABS 9.6* 8.7* 18.3*  --   --   --   --   --   --   HGB 11.7* 11.8* 10.8*   < > 10.6* 10.7* 11.4* 11.9* 13.3  HCT 35.6* 36.7 32.6*   < > 32.1* 32.1* 35* 35.7* 40  MCV 85.4 88.4 86.5   < > 86.5 84.7  --  85.2  --   PLT 365 316 363   < > 357 448* 343 384.0 241   < > = values in this interval not displayed.   Lab Results  Component Value Date   TSH 1.427 01/13/2022   Lab Results  Component  Value Date   HGBA1C 6.2 07/15/2022   Lab Results  Component Value Date   CHOL 220 (A) 07/15/2022   HDL 57 07/15/2022   LDLCALC 131 07/15/2022   TRIG 156 07/15/2022    Significant Diagnostic Results in last 30 days:  Yag Capsulotomy - OD - Right Eye  Result Date: 07/08/2022 Time Out Confirmed correct patient, procedure, site, and patient consented. Anesthesia Topical anesthesia was used. Notes Procedure note: YAG Capsulotomy, RIGHT Eye Informed consent obtained. Pre-op dilating drops (1% Topicamide and 2.5% Phenylephrine), and topical anesthesia given. Power: 6.5 mJ Shots: 14 Posterior capsulotomy in cruciate formation performed without difficulty. Patient tolerated procedure well. No complications. Rx pred forte 4 times a day for 7 days, then stop. Pt received written and verbal post laser education. Recheck in 2-3 weeks w/ dilated exam.   OCT, Retina - OU - Both Eyes  Result Date: 07/08/2022 Right Eye Quality was good. Central Foveal Thickness: 168.  Progression has been stable. Findings include no IRF, no SRF, abnormal foveal contour, retinal drusen , subretinal hyper-reflective material, intraretinal hyper-reflective material, pigment epithelial detachment, outer retinal atrophy (Severe central thinning with SRHM / scarring ). Left Eye Quality was good. Central Foveal Thickness: 131. Progression has been stable. Findings include no IRF, no SRF, abnormal foveal contour, retinal drusen , pigment epithelial detachment, inner retinal atrophy, outer retinal atrophy (Severe central thinning / atrophy). Notes *Images captured and stored on drive Diagnosis / Impression: non-exu ARMD OU OD: Severe central thinning with SRHM / scarring OS: Severe central thinning / atrophy Clinical management: See below Abbreviations: NFP - Normal foveal profile. CME - cystoid macular edema. PED - pigment epithelial detachment. IRF - intraretinal fluid. SRF - subretinal fluid. EZ - ellipsoid zone. ERM - epiretinal membrane. ORA - outer retinal atrophy. ORT - outer retinal tubulation. SRHM - subretinal hyper-reflective material. IRHM - intraretinal hyper-reflective material    Assessment/Plan     Total time 61mn:  time greater than 50% of total time spent doing pt counseling and coordination of care       Declined dexa scan and tdap vaccine  F/U in 6 months with Dr GLyndel Safe   Labs/tests ordered:  CBC BMP A1C in 6 months

## 2022-07-29 ENCOUNTER — Encounter (INDEPENDENT_AMBULATORY_CARE_PROVIDER_SITE_OTHER): Payer: Self-pay | Admitting: Ophthalmology

## 2022-07-29 ENCOUNTER — Other Ambulatory Visit (HOSPITAL_BASED_OUTPATIENT_CLINIC_OR_DEPARTMENT_OTHER): Payer: Self-pay

## 2022-07-29 ENCOUNTER — Ambulatory Visit (INDEPENDENT_AMBULATORY_CARE_PROVIDER_SITE_OTHER): Payer: Medicare Other | Admitting: Ophthalmology

## 2022-07-29 DIAGNOSIS — Z961 Presence of intraocular lens: Secondary | ICD-10-CM

## 2022-07-29 DIAGNOSIS — I10 Essential (primary) hypertension: Secondary | ICD-10-CM | POA: Diagnosis not present

## 2022-07-29 DIAGNOSIS — H353134 Nonexudative age-related macular degeneration, bilateral, advanced atrophic with subfoveal involvement: Secondary | ICD-10-CM

## 2022-07-29 DIAGNOSIS — H35033 Hypertensive retinopathy, bilateral: Secondary | ICD-10-CM

## 2022-07-29 DIAGNOSIS — H26492 Other secondary cataract, left eye: Secondary | ICD-10-CM

## 2022-07-29 MED ORDER — FLUAD QUADRIVALENT 0.5 ML IM PRSY
PREFILLED_SYRINGE | INTRAMUSCULAR | 0 refills | Status: AC
  Filled 2022-07-29: qty 0.5, 1d supply, fill #0

## 2023-01-15 LAB — CBC AND DIFFERENTIAL
HCT: 41 (ref 36–46)
Hemoglobin: 13.8 (ref 12.0–16.0)
Platelets: 210 10*3/uL (ref 150–400)
WBC: 6.4

## 2023-01-15 LAB — HEMOGLOBIN A1C: Hemoglobin A1C: 6.3

## 2023-01-15 LAB — COMPREHENSIVE METABOLIC PANEL
Calcium: 9.1 (ref 8.7–10.7)
eGFR: 80

## 2023-01-15 LAB — BASIC METABOLIC PANEL
BUN: 19 (ref 4–21)
CO2: 23 — AB (ref 13–22)
Chloride: 106 (ref 99–108)
Creatinine: 0.7 (ref 0.5–1.1)
Glucose: 105
Potassium: 4.3 mEq/L (ref 3.5–5.1)
Sodium: 140 (ref 137–147)

## 2023-01-15 LAB — CBC: RBC: 4.77 (ref 3.87–5.11)

## 2023-01-20 ENCOUNTER — Encounter: Payer: Self-pay | Admitting: Internal Medicine

## 2023-01-20 ENCOUNTER — Non-Acute Institutional Stay: Payer: Medicare Other | Admitting: Internal Medicine

## 2023-01-20 VITALS — BP 124/64 | HR 77 | Temp 97.6°F | Resp 17 | Ht 63.0 in | Wt 138.2 lb

## 2023-01-20 DIAGNOSIS — Z87448 Personal history of other diseases of urinary system: Secondary | ICD-10-CM

## 2023-01-20 DIAGNOSIS — E7800 Pure hypercholesterolemia, unspecified: Secondary | ICD-10-CM | POA: Diagnosis not present

## 2023-01-20 DIAGNOSIS — H3530 Unspecified macular degeneration: Secondary | ICD-10-CM

## 2023-01-20 DIAGNOSIS — R7303 Prediabetes: Secondary | ICD-10-CM

## 2023-01-20 DIAGNOSIS — M25552 Pain in left hip: Secondary | ICD-10-CM

## 2023-01-20 NOTE — Progress Notes (Signed)
Location:  [place] of Service:  Clinic (12)  Provider:   Code Status: *** Goals of Care:     01/20/2023    1:21 PM  Advanced Directives  Does Patient Have a Medical Advance Directive? Yes  Type of Paramedic of [place];Living will;Out of facility DNR (pink MOST or yellow form)     Chief Complaint  Patient presents with  . Medical Management of Chronic Issues    6 month follow up with some labs    HPI: Patient is a 89 y.o. female seen today for medical management of chronic diseases.     Past Medical History:  Diagnosis Date  . Allergy   . Arthritis   . Back pain   . Chronic cystitis with hematuria   . Colon polyps   . Cystocele, unspecified (CODE)   . Diverticulitis   . Dyslipidemia   . Gallstones   . Gastric ulcer   . Gastritis   . GERD (gastroesophageal reflux disease)   . Hearing loss   . Hemorrhoids   . Hyperlipidemia   . Hypertension   . Hypertensive retinopathy    OU  . Impaired fasting glucose   . Macular degeneration    Dry OU  . Melanoma in situ of neck ([place])   . Renal insufficiency     Past Surgical History:  Procedure Laterality Date  . APPENDECTOMY    . CATARACT EXTRACTION Bilateral    Dr. Loni Dolly - [place], [place]     with appendectomy  . EYE SURGERY Bilateral    Cat Sx - Dr. Loni Dolly - [place], [place]  . THROAT SURGERY     calcified ligament  . TONSILLECTOMY      Allergies  Allergen Reactions  . Oysters [Shellfish Allergy] Swelling  . Sulfa Antibiotics     CANNOT REMEMBER REACTION    Outpatient Encounter Medications as of 01/20/2023  Medication Sig  . glucose blood (RELION PREMIER TEST) test strip Use to test blood sugar up to 3 times daily. Dx: E11.65  . influenza vaccine adjuvanted (FLUAD QUADRIVALENT) 0.5 ML injection Inject into the muscle.  . Multiple Vitamins-Minerals (ICAPS AREDS 2 PO) Take 1 tablet by mouth 2 (two) times daily.  . psyllium  (METAMUCIL) 58.6 % packet Take 1 packet by mouth daily.   No facility-administered encounter medications on file as of 01/20/2023.    Review of Systems:  Review of Systems  Health Maintenance  Topic Date Due  . Medicare Annual Wellness (AWV)  08/17/2020  . COVID-19 Vaccine (8 - 2023-24 season) 02/05/2023 (Originally 06/27/2022)  . DEXA SCAN  07/22/2023 (Originally 11/04/1998)  . OPHTHALMOLOGY EXAM  07/09/2023  . HEMOGLOBIN A1C  07/18/2023  . FOOT EXAM  07/23/2023  . Pneumonia Vaccine 40+ Years old  Completed  . INFLUENZA VACCINE  Completed  . Zoster Vaccines- Shingrix  Completed  . HPV VACCINES  Aged Out  . DTaP/Tdap/Td  Discontinued    Physical Exam: Vitals:   01/20/23 1323  BP: 124/64  Pulse: 77  Resp: 17  Temp: 97.6 F (36.4 C)  TempSrc: Temporal  SpO2: 97%  Weight: 138 lb 3.2 oz (62.7 kg)  Height: 5\' 3"  (1.6 m)   Body mass index is 24.48 kg/m. Physical Exam  Labs reviewed: Basic Metabolic Panel: Recent Labs    02/03/22 1019 03/11/22 0000 07/15/22 0000 01/15/23 0000  NA 140 143 141 140  K 4.2 4.4 4.2 4.3  CL 104 106 105  106  CO2 25 23* 23* 23*  GLUCOSE 159*  --   --   --   BUN 12 12 14 19   CREATININE 0.83 0.6 0.7 0.7  CALCIUM 9.2 9.3 9.0 9.1   Liver Function Tests: No results for input(s): "AST", "ALT", "ALKPHOS", "BILITOT", "PROT", "ALBUMIN" in the last 8760 hours. No results for input(s): "LIPASE", "AMYLASE" in the last 8760 hours. No results for input(s): "AMMONIA" in the last 8760 hours. CBC: Recent Labs    02/03/22 1019 03/11/22 0000 01/15/23 0000  WBC 6.4 5.0 6.4  HGB 11.9* 13.3 13.8  HCT 35.7* 40 41  MCV 85.2  --   --   PLT 384.0 241 210   Lipid Panel: Recent Labs    03/11/22 0000 07/15/22 0000  CHOL 185 220*  HDL 53 57  LDLCALC 93 131  TRIG 194* 156   Lab Results  Component Value Date   HGBA1C 6.3 01/15/2023    Procedures since last visit: No results found.  Assessment/Plan There are no diagnoses linked to this  encounter.   Labs/tests ordered:  * No order type specified * Next appt:  Visit date not found

## 2023-01-28 NOTE — Progress Notes (Signed)
[facility] - Clinic Note  01/30/2023     CHIEF COMPLAINT Patient presents for Retina Follow Up   HISTORY OF PRESENT ILLNESS: Yolanda Burke is a 89 y.o. female who presents to the clinic today for:   HPI     Retina Follow Up   Patient presents with  Dry AMD.  In both eyes.  Severity is moderate.  Duration of 6 months.  Since onset it is stable.  I, the attending physician,  performed the HPI with the patient and updated documentation appropriately.        Comments   Pt here for 6 mo ret f/u non exu ARMD OU. Pt states VA has maybe gotten a bit worse. She's still able to read but its gotten more difficult. No wavy lines.       Last edited by Rennis ChrisZamora, Teegan Brandis, MD on 01/30/2023 12:03 PM.    Pt states feels like her vision is "bad" she wears reading glasses over her regular glasses, she saw Dr. Joseph ArtWoods at [place] and he said her eyes were very dry and she needs to use drops, she has been using Refresh BID OU  Referring physician: Mahlon GammonGupta, Anjali L, MD [address] [place] [id number]  HISTORICAL INFORMATION:   Selected notes from the MEDICAL RECORD NUMBER [facility], pt wants to switch retina specialists LEE:  Ocular Hx- PMH-    CURRENT MEDICATIONS: No current outpatient medications on file. (Ophthalmic Drugs)   No current facility-administered medications for this visit. (Ophthalmic Drugs)   Current Outpatient Medications (Other)  Medication Sig   glucose blood (RELION PREMIER TEST) test strip Use to test blood sugar up to 3 times daily. Dx: E11.65   influenza vaccine adjuvanted (FLUAD QUADRIVALENT) 0.5 ML injection Inject into the muscle.   Multiple Vitamins-Minerals (ICAPS AREDS 2 PO) Take 1 tablet by mouth 2 (two) times daily.   psyllium (METAMUCIL) 58.6 % packet Take 1 packet by mouth daily.   No current facility-administered medications for this visit. (Other)   REVIEW OF SYSTEMS: ROS   Positive for: Gastrointestinal, Skin,  Genitourinary, Musculoskeletal, HENT, Eyes Negative for: Constitutional, Neurological, Endocrine, Cardiovascular, Respiratory, Psychiatric, Allergic/Imm, Heme/Lymph Last edited by Thompson GrayerSimpson, Makenzie E, COT on 01/30/2023  9:46 AM.      ALLERGIES Allergies  Allergen Reactions   Oysters [Shellfish Allergy] Swelling   Sulfa Antibiotics     CANNOT REMEMBER REACTION   PAST MEDICAL HISTORY Past Medical History:  Diagnosis Date   Allergy    Arthritis    Back pain    Chronic cystitis with hematuria    Colon polyps    Cystocele, unspecified (CODE)    Diverticulitis    Dyslipidemia    Gallstones    Gastric ulcer    Gastritis    GERD (gastroesophageal reflux disease)    Hearing loss    Hemorrhoids    Hyperlipidemia    Hypertension    Hypertensive retinopathy    OU   Impaired fasting glucose    Macular degeneration    Dry OU   Melanoma in situ of neck    Renal insufficiency    Past Surgical History:  Procedure Laterality Date   APPENDECTOMY     CATARACT EXTRACTION Bilateral    Dr. Alferd [place]mond [place]   CHOLECYSTECTOMY     with appendectomy   EYE SURGERY Bilateral    Cat Sx - [place]   THROAT SURGERY  calcified ligament   TONSILLECTOMY     FAMILY HISTORY Family History  Problem Relation Age of Onset   Heart failure Mother    Dementia Mother    Pneumonia Father    Kidney failure Father    Heart disease Brother    Bladder Cancer Brother    SOCIAL HISTORY Social History   Tobacco Use   Smoking status: Former    Types: Cigarettes    Quit date: 03/14/1985    Years since quitting: 37.9   Smokeless tobacco: Never   Tobacco comments:    Smoked until age 67  Vaping Use   Vaping Use: Never used  Substance Use Topics   Alcohol use: Yes    Comment: social-occasional   Drug use: Never       OPHTHALMIC EXAM:  Base Eye Exam     Visual Acuity (Snellen - Linear)       Right Left   Dist cc 20/50 -2 20/70   Dist ph cc NI 20/60 -3     Correction: Glasses         Tonometry (Tonopen, 9:53 AM)       Right Left   Pressure 12 11         Pupils       Pupils Dark Light Shape React APD   Right PERRL 3 2 Round Brisk None   Left PERRL 3 2 Round Brisk None         Visual Fields (Counting fingers)       Left Right    Full Full         Extraocular Movement       Right Left    Full, Ortho Full, Ortho         Neuro/Psych     Oriented x3: Yes   Mood/Affect: Normal         Dilation     Both eyes: 1.0% Mydriacyl, 2.5% Phenylephrine @ 9:53 AM           Slit Lamp and Fundus Exam     Slit Lamp Exam       Right Left   Lids/Lashes Dermatochalasis - upper lid Dermatochalasis - upper lid   Conjunctiva/Sclera White and quiet White and quiet   Cornea arcus, 1-2+Punctate epithelial erosions, well healed cataract wound arcus, 2+fine Punctate epithelial erosions, well healed cataract wound   Anterior Chamber deep and clear deep and clear   Iris Round and dilated Round and dilated   Lens PC IOL in good position with open PC PC IOL in good position with open PC   Anterior Vitreous Vitreous syneresis Vitreous syneresis, silicone micro bubbles         Fundus Exam       Right Left   Disc mild Pallor, Sharp rim, Compact Pink and Sharp, Compact   C/D Ratio 0.2 0.1   Macula Blunted foveal reflex, central GA with refractile subretinal deposits within, scattered drusen, no heme Flat, blunted foveal reflex, central GA with pigment ring, scattered drusen, no heme or edema   Vessels attenuated, Tortuous attenuated, Tortuous   Periphery Attached, no heme, No RT/RD Attached, no heme             Refraction     Wearing Rx       Sphere Cylinder Axis Add   Right -2.25 +2.00 015 +3.00   Left -2.25 +2.75 155 +3.00           IMAGING AND PROCEDURES  Imaging and  Procedures for 01/30/2023  OCT, Retina - OU - Both Eyes       Right Eye Quality was good. Central Foveal Thickness: 167. Progression has  been stable. Findings include no IRF, no SRF, abnormal foveal contour, retinal drusen , subretinal hyper-reflective material, intraretinal hyper-reflective material, pigment epithelial detachment, outer retinal atrophy (Severe central thinning with SRHM / scarring and ORA).   Left Eye Quality was good. Central Foveal Thickness: 91. Progression has been stable. Findings include no IRF, no SRF, abnormal foveal contour, retinal drusen , pigment epithelial detachment, inner retinal atrophy, outer retinal atrophy (Severe central thinning / atrophy).   Notes *Images captured and stored on drive  Diagnosis / Impression:  non-exu ARMD OU OD: Severe central thinning with SRHM / scarring and ORA OS: Severe central thinning / atrophy  Clinical management:  See below  Abbreviations: NFP - Normal foveal profile. CME - cystoid macular edema. PED - pigment epithelial detachment. IRF - intraretinal fluid. SRF - subretinal fluid. EZ - ellipsoid zone. ERM - epiretinal membrane. ORA - outer retinal atrophy. ORT - outer retinal tubulation. SRHM - subretinal hyper-reflective material. IRHM - intraretinal hyper-reflective material             ASSESSMENT/PLAN:    ICD-10-CM   1. Advanced atrophic nonexudative age-related macular degeneration of both eyes with subfoveal involvement  H35.3134 OCT, Retina - OU - Both Eyes    2. Essential hypertension  I10     3. Hypertensive retinopathy of both eyes  H35.033     4. Pseudophakia of both eyes  Z96.1      1. Age related macular degeneration, non-exudative, both eyes  - former pt at [facility] and in [place]; moved from [place] in 2020)  - history of intravitreal injections with Reymundo Poll, last being ~2016  - exam shows central GA OU, OD with central refractile drusen and scarring  - BCVA 20/50 OD, 20/60 OS -- stable OU  - discussed natural progression of non-ex ARMD  - Continue AREDS 2 supplements and amsler grid monitoring  - f/u 6  months, DFE, OCT  2,3. Hypertensive retinopathy OU - discussed importance of tight BP control - monitor  4. Pseudophakia OU  - s/p CE/IOL OU (Dr. Alferd Patee, [place], [place])  - IOL in good position, doing well  - monitor  Ophthalmic Meds Ordered this visit:  No orders of the defined types were placed in this encounter.    Return in about 6 months (around 08/01/2023) for f/u non-exu ARMD OU, DFE, OCT.  There are no Patient Instructions on file for this visit.  This document serves as a record of services personally performed by Karie Chimera, MD, PhD. It was created on their behalf by Berlin Hun COT, an ophthalmic technician. The creation of this record is the provider's dictation and/or activities during the visit.    Electronically signed by: Berlin Hun COT 04.03.2024 12:04 PM  This document serves as a record of services personally performed by Karie Chimera, MD, PhD. It was created on their behalf by Glee Arvin. Manson Passey, OA an ophthalmic technician. The creation of this record is the provider's dictation and/or activities during the visit.    Electronically signed by: Glee Arvin. Manson Passey, [place] 04.05.2024 12:04 PM  Karie Chimera, M.D., Ph.D. Diseases & Surgery of the Retina and Vitreous [facility] 01/30/2023   I have reviewed the above documentation for accuracy and completeness, and I agree with the above. Karie Chimera,  M.D., Ph.D. 01/30/23 12:06 PM   Abbreviations: M myopia (nearsighted); A astigmatism; H hyperopia (farsighted); P presbyopia; Mrx spectacle prescription;  CTL contact lenses; OD right eye; OS left eye; OU both eyes  XT exotropia; ET esotropia; PEK punctate epithelial keratitis; PEE punctate epithelial erosions; DES dry eye syndrome; MGD meibomian gland dysfunction; ATs artificial tears; PFAT's preservative free artificial tears; NSC nuclear sclerotic cataract; PSC posterior subcapsular cataract; ERM epi-retinal membrane; PVD  posterior vitreous detachment; RD retinal detachment; DM diabetes mellitus; DR diabetic retinopathy; NPDR non-proliferative diabetic retinopathy; PDR proliferative diabetic retinopathy; CSME clinically significant macular edema; DME diabetic macular edema; dbh dot blot hemorrhages; CWS cotton wool spot; POAG primary open angle glaucoma; C/D cup-to-disc ratio; HVF humphrey visual field; GVF goldmann visual field; OCT optical coherence tomography; IOP intraocular pressure; BRVO Branch retinal vein occlusion; CRVO central retinal vein occlusion; CRAO central retinal artery occlusion; BRAO branch retinal artery occlusion; RT retinal tear; SB scleral buckle; PPV pars plana vitrectomy; VH Vitreous hemorrhage; PRP panretinal laser photocoagulation; IVK intravitreal kenalog; VMT vitreomacular traction; MH Macular hole;  NVD neovascularization of the disc; NVE neovascularization elsewhere; AREDS age related eye disease study; ARMD age related macular degeneration; POAG primary open angle glaucoma; EBMD epithelial/anterior basement membrane dystrophy; ACIOL anterior chamber intraocular lens; IOL intraocular lens; PCIOL posterior chamber intraocular lens; Phaco/IOL phacoemulsification with intraocular lens placement; PRK photorefractive keratectomy; LASIK laser assisted in situ keratomileusis; HTN hypertension; DM diabetes mellitus; COPD chronic obstructive pulmonary disease

## 2023-01-30 ENCOUNTER — Ambulatory Visit (INDEPENDENT_AMBULATORY_CARE_PROVIDER_SITE_OTHER): Payer: Medicare Other | Admitting: Ophthalmology

## 2023-01-30 ENCOUNTER — Encounter (INDEPENDENT_AMBULATORY_CARE_PROVIDER_SITE_OTHER): Payer: Self-pay | Admitting: Ophthalmology

## 2023-01-30 DIAGNOSIS — I10 Essential (primary) hypertension: Secondary | ICD-10-CM | POA: Diagnosis not present

## 2023-01-30 DIAGNOSIS — H353134 Nonexudative age-related macular degeneration, bilateral, advanced atrophic with subfoveal involvement: Secondary | ICD-10-CM | POA: Diagnosis not present

## 2023-01-30 DIAGNOSIS — Z961 Presence of intraocular lens: Secondary | ICD-10-CM | POA: Diagnosis not present

## 2023-01-30 DIAGNOSIS — H35033 Hypertensive retinopathy, bilateral: Secondary | ICD-10-CM

## 2023-05-05 IMAGING — DX DG CHEST 1V PORT
1 series · 1 of 1 positions shown · non-contrast
Comparison: Multiple exams, including 01/20/2022

CLINICAL DATA: Right chest tube removal, empyema

EXAM:
PORTABLE CHEST 1 VIEW

[chest ap]
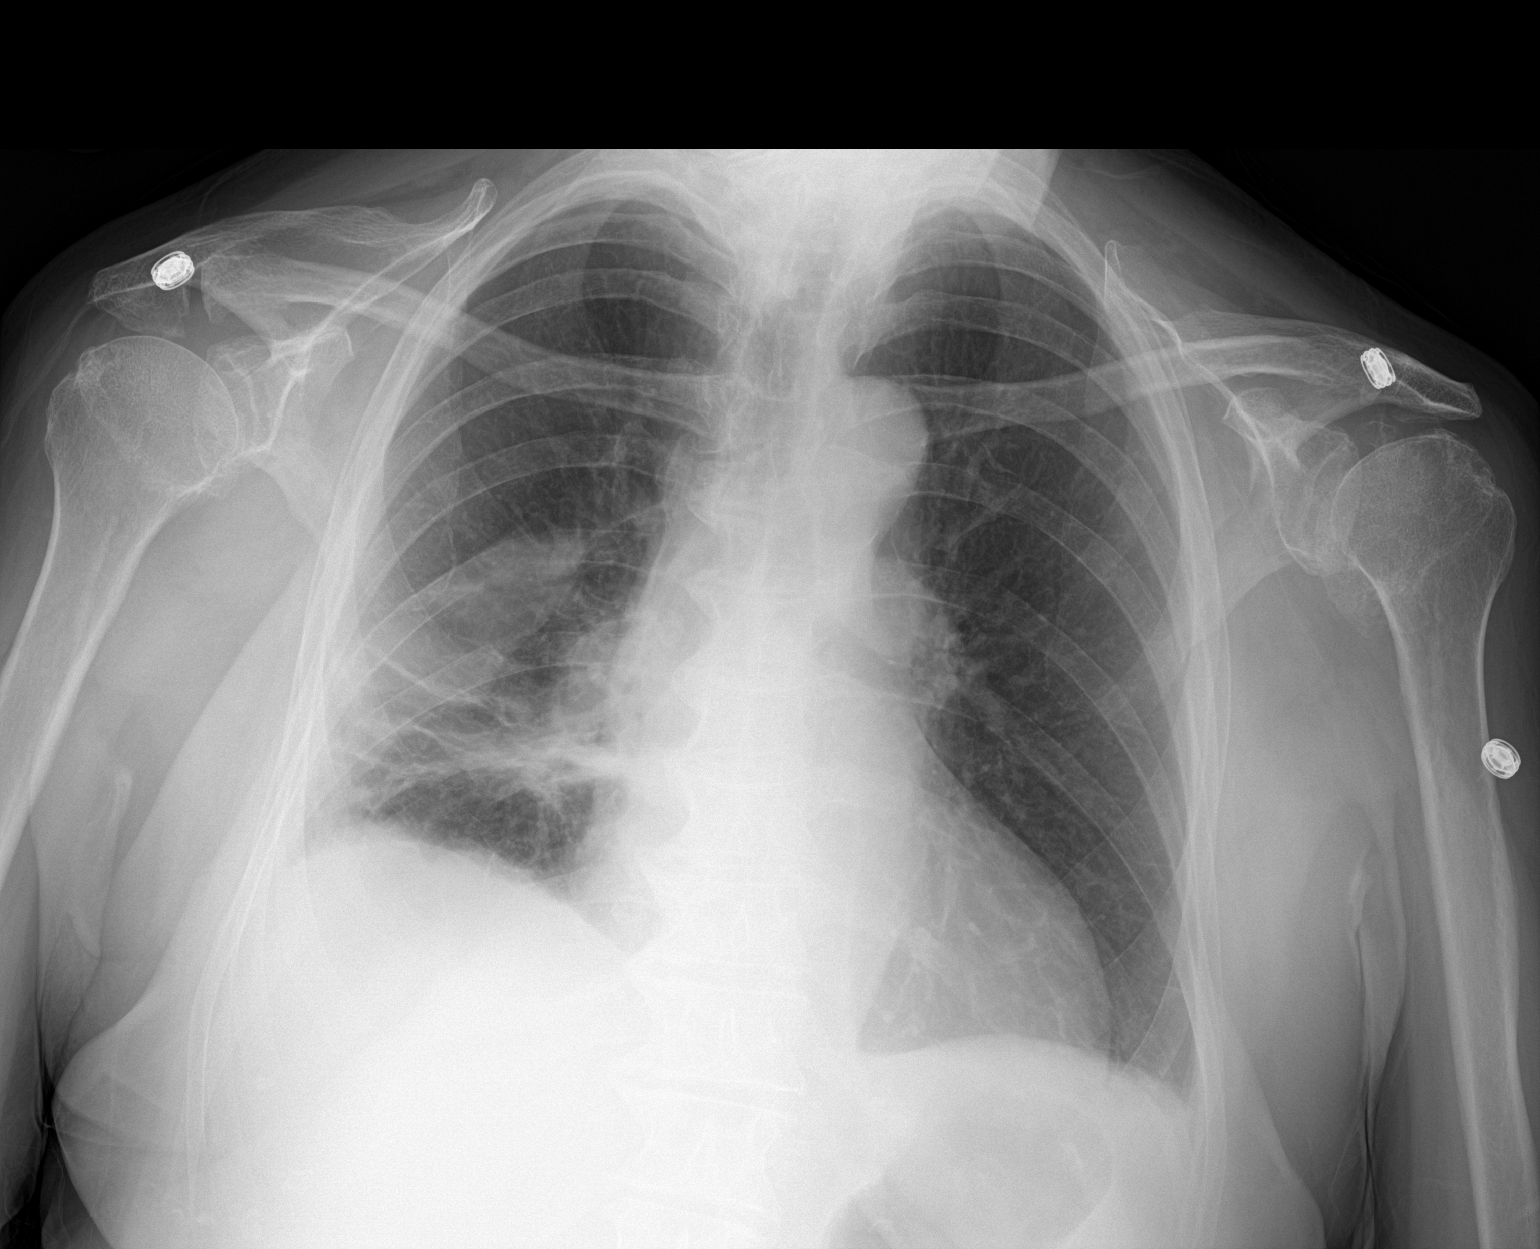

[1 of 1 positions shown; findings below may reference images not displayed]

FINDINGS: The pigtail right pleural drainage catheter has been removed. No
appreciable pneumothorax. Confluent bandlike densities in the right
middle lobe and/or right lower lobe primarily from atelectasis, a
component of residual pneumonia is difficult to exclude. Amorphous
but lobular density in the right mid lung likely representing
loculations of pleural fluid along the major fissure shown on prior
CT. Mild blunting of the right lateral costophrenic angle.

Atherosclerotic calcification of the aortic arch. Thoracic
spondylosis. Upper normal heart size.

Extensive spurring of the left humeral head with mild to moderate
spurring of the right humeral head. Spurring of the bilateral
glenoid.
IMPRESSION: 1. The right pleural drainage catheter is been removed. No
substantial pneumothorax.
2. Bandlike confluent opacity primarily in the right middle lobe
possibly with some right lower lobe involvement.
3. Lobular opacities in the right mid lung probably represent
loculated pleural effusion components along the major fissure or
posterior pleural margin.
4.  Aortic Atherosclerosis ([id number]).
5. Thoracic spondylosis.
6. Degenerative glenohumeral arthropathy, left greater than right.

## 2023-05-05 IMAGING — DX DG CHEST 1V PORT
1 series · 1 of 1 positions shown · non-contrast
Comparison: January 18, 2022

CLINICAL DATA: Chest tube positioning.

EXAM:
PORTABLE CHEST 1 VIEW

[chest ap]
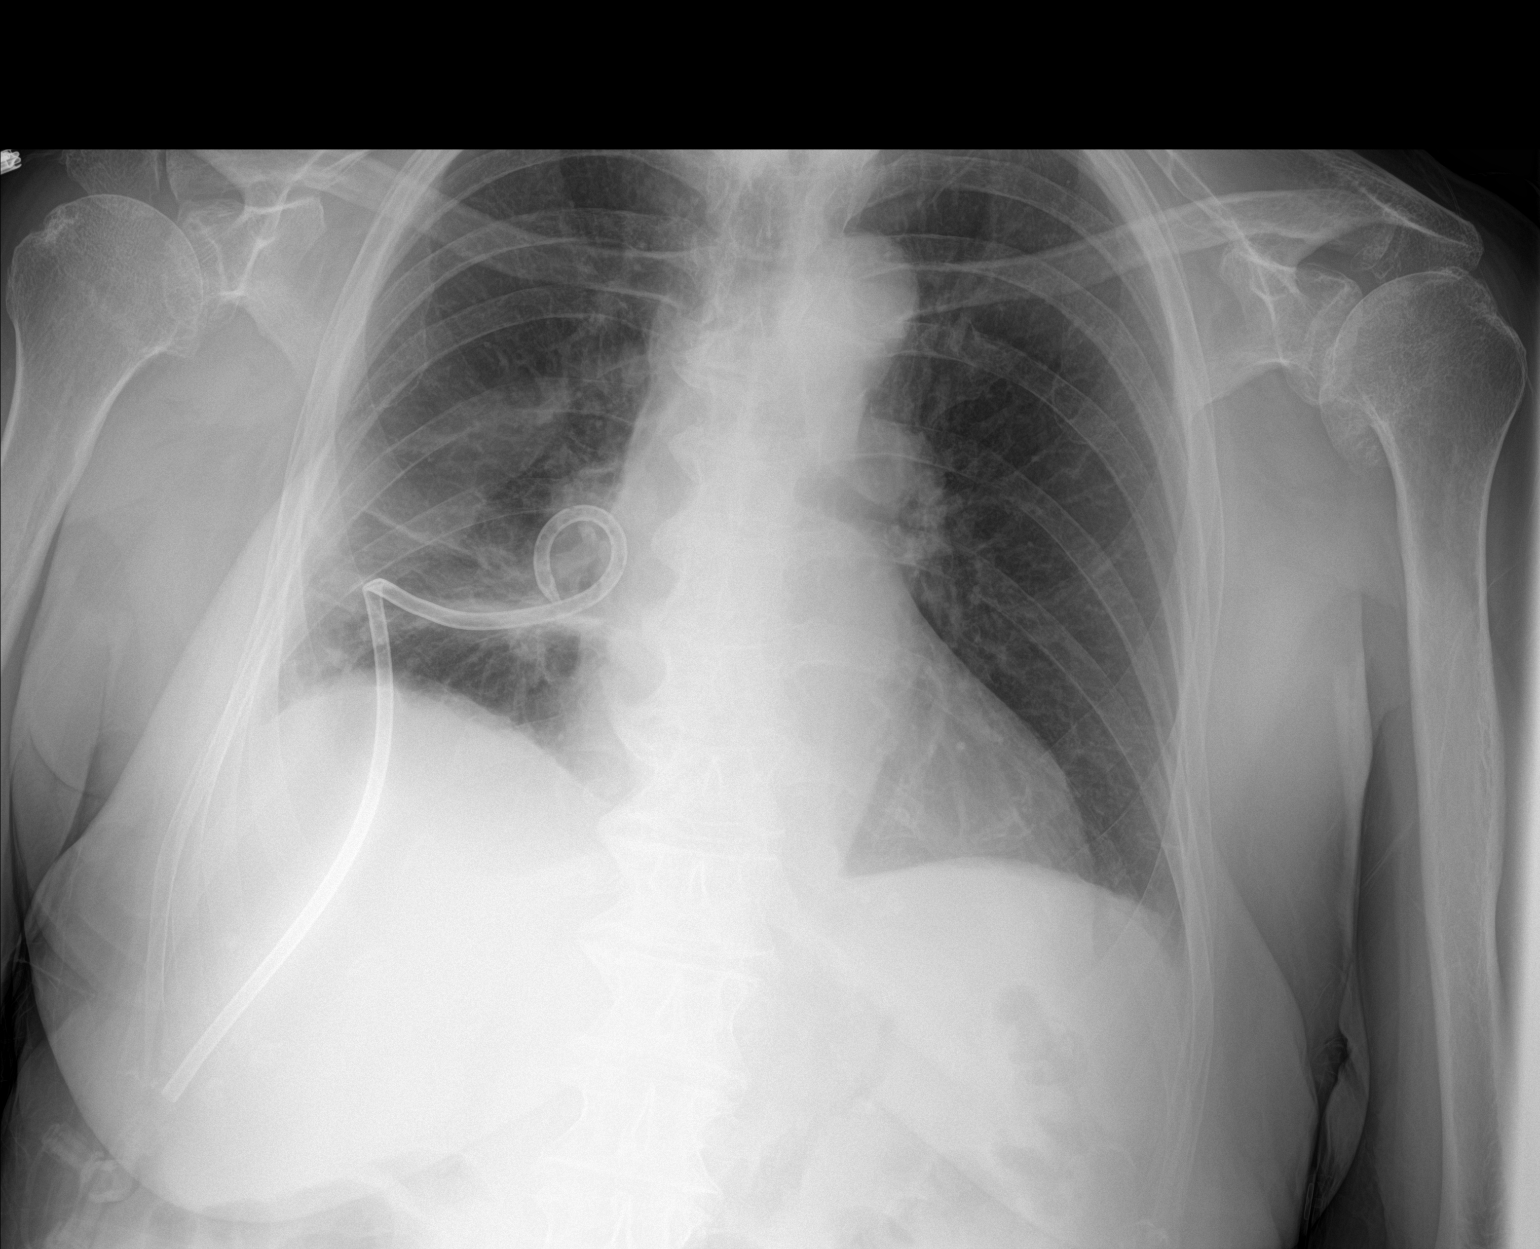

[1 of 1 positions shown; findings below may reference images not displayed]

FINDINGS: Stable right-sided chest tube positioning is seen, with its distal
tip overlying the infrahilar region on the right. Mild, stable right
basilar linear atelectasis is noted. There is no evidence of a
pleural effusion or pneumothorax. The heart size and mediastinal
contours are within normal limits. Multilevel degenerative changes
are seen throughout the thoracic spine.
IMPRESSION: 1. Stable right-sided chest tube positioning without pneumothorax or
pleural effusion.
2. Mild, stable right basilar linear atelectasis.

## 2023-05-19 IMAGING — DX DG CHEST 2V
2 series · 2 of 2 positions shown · non-contrast
Comparison: Chest XR, most recently 01/20/2022. CT chest,
01/13/2022.

CLINICAL DATA: Pneumonia.

EXAM:
CHEST - 2 VIEW

[chest pa]
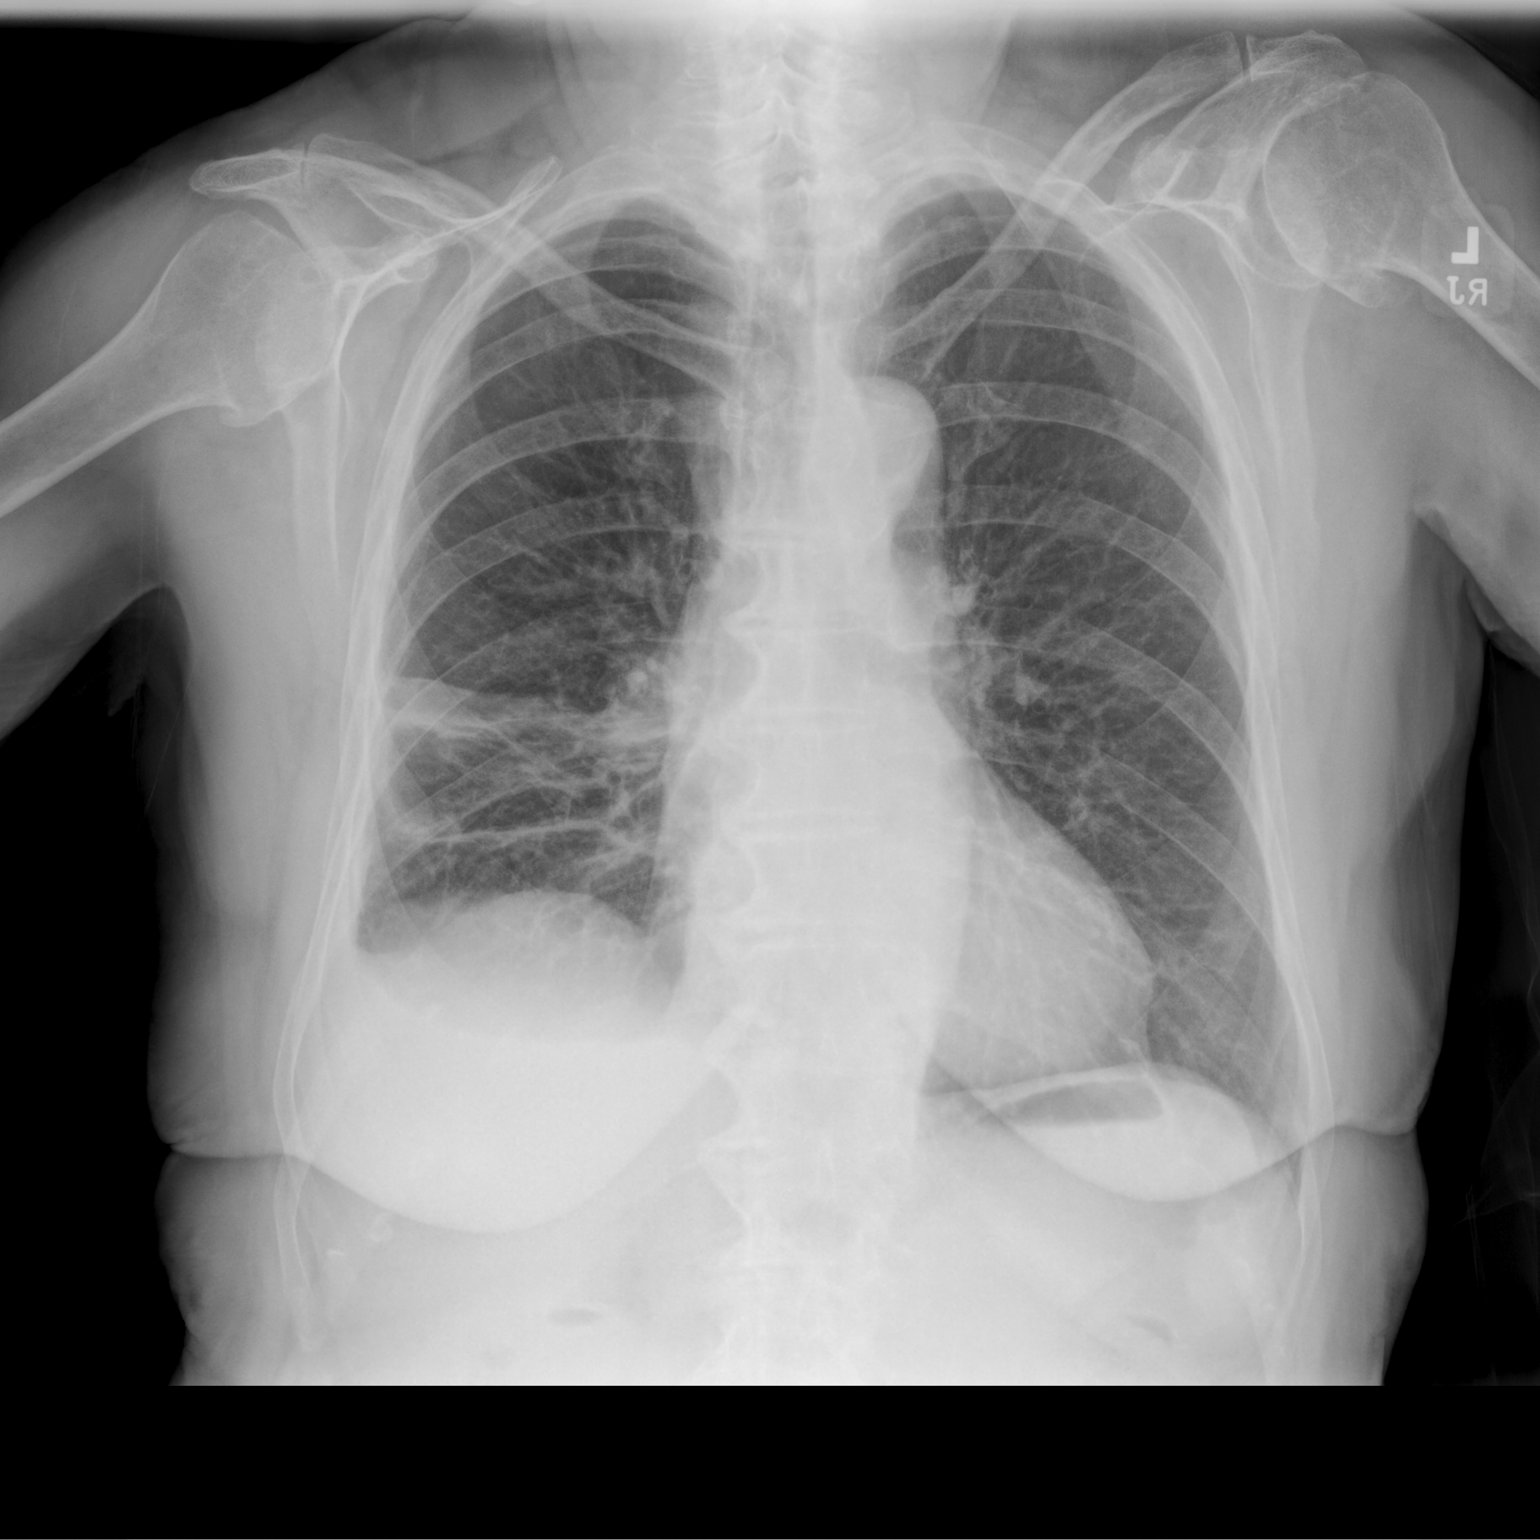

[chest lat]
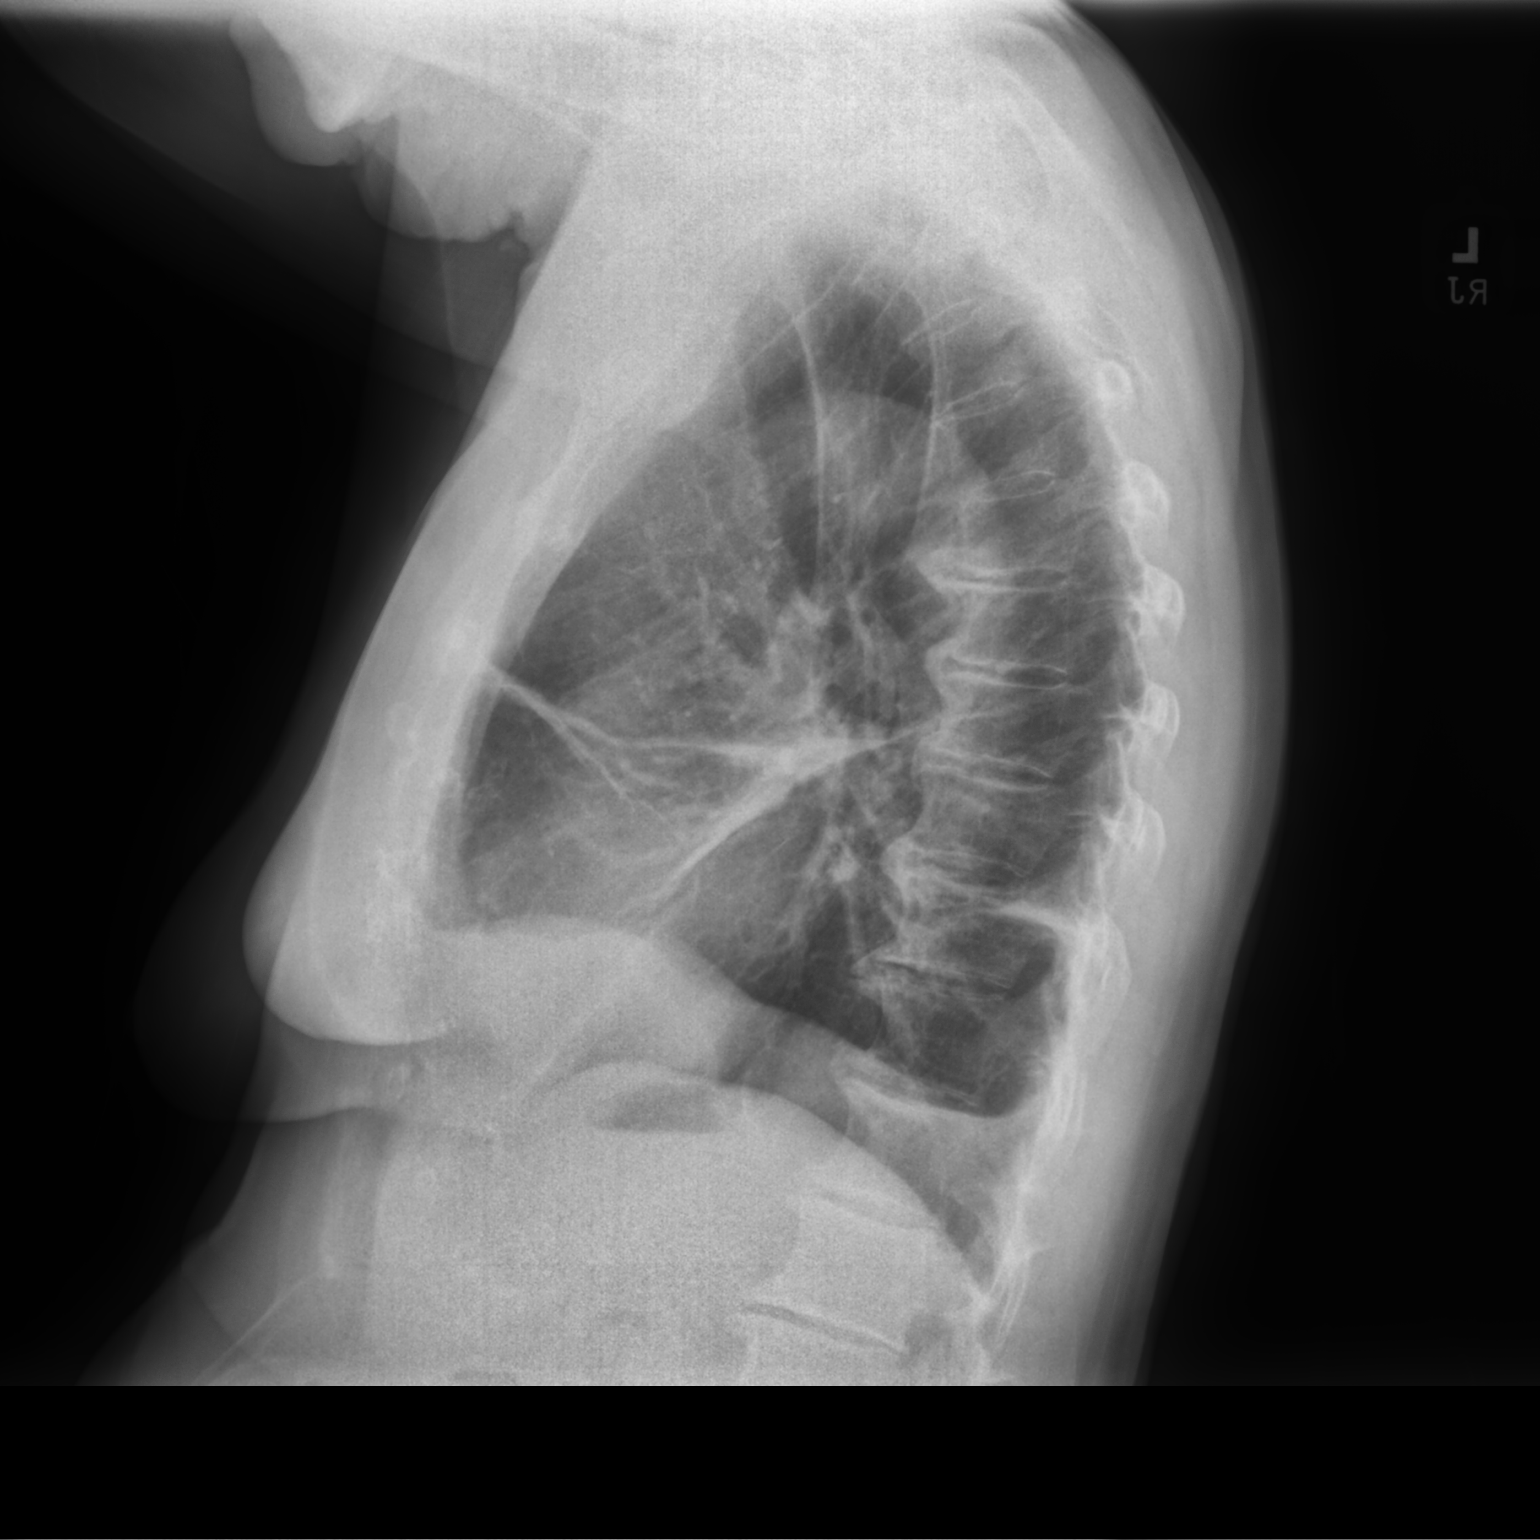

[2 of 2 positions shown; findings below may reference images not displayed]

FINDINGS: Cardiomediastinal silhouette is within normal limits. Lungs are well
inflated. Persistent linear streaky opacities within the middle and
RIGHT lower lobe, and trace fluid layering along the minor fissure.
Small volume of RIGHT pleural effusion. No pneumothorax. Thoracic
spine and bilateral glenohumeral degenerative change. No acute
osseous abnormality.
IMPRESSION: Small volume RIGHT pleural effusion and linear opacities at the
RIGHT lung base, favor atelectasis.

## 2023-07-14 LAB — COMPREHENSIVE METABOLIC PANEL WITH GFR
Albumin: 3.8 (ref 3.5–5.0)
Calcium: 8.7 (ref 8.7–10.7)
Globulin: 2.2
eGFR: 83

## 2023-07-14 LAB — LIPID PANEL
Cholesterol: 146 (ref 0–200)
HDL: 43 (ref 35–70)
LDL Cholesterol: 73
LDl/HDL Ratio: 3.4
Triglycerides: 150 (ref 40–160)

## 2023-07-14 LAB — BASIC METABOLIC PANEL WITH GFR
BUN: 13 (ref 4–21)
CO2: 24 — AB (ref 13–22)
Chloride: 108 (ref 99–108)
Creatinine: 0.7 (ref 0.5–1.1)
Glucose: 112
Potassium: 4.2 meq/L (ref 3.5–5.1)
Sodium: 146 (ref 137–147)

## 2023-07-14 LAB — TSH: TSH: 2.49 (ref 0.41–5.90)

## 2023-07-14 LAB — HEPATIC FUNCTION PANEL
ALT: 13 U/L (ref 7–35)
AST: 21 (ref 13–35)
Alkaline Phosphatase: 115 (ref 25–125)
Bilirubin, Total: 0.3

## 2023-07-14 LAB — CBC: RBC: 4.39 (ref 3.87–5.11)

## 2023-07-14 LAB — CBC AND DIFFERENTIAL
HCT: 38 (ref 36–46)
Hemoglobin: 12.6 (ref 12.0–16.0)
Platelets: 229 10*3/uL (ref 150–400)
WBC: 5.5

## 2023-07-20 ENCOUNTER — Non-Acute Institutional Stay: Payer: Medicare Other | Admitting: Adult Health

## 2023-07-20 ENCOUNTER — Encounter: Payer: Self-pay | Admitting: Adult Health

## 2023-07-20 VITALS — BP 128/74 | HR 77 | Temp 97.8°F | Resp 17 | Ht 63.0 in | Wt 139.0 lb

## 2023-07-20 DIAGNOSIS — N3946 Mixed incontinence: Secondary | ICD-10-CM | POA: Diagnosis not present

## 2023-07-20 DIAGNOSIS — M25512 Pain in left shoulder: Secondary | ICD-10-CM

## 2023-07-20 DIAGNOSIS — E1165 Type 2 diabetes mellitus with hyperglycemia: Secondary | ICD-10-CM

## 2023-07-20 DIAGNOSIS — E7800 Pure hypercholesterolemia, unspecified: Secondary | ICD-10-CM

## 2023-07-20 DIAGNOSIS — I872 Venous insufficiency (chronic) (peripheral): Secondary | ICD-10-CM

## 2023-07-20 MED ORDER — DICLOFENAC SODIUM 1 % EX GEL: 2.0000 g | Freq: Four times a day (QID) | CUTANEOUS | Status: DC

## 2023-07-20 NOTE — Progress Notes (Signed)
Location: Oncologist   POS: Clinic   Provider:   Peggye Ley, ANP [facility] [phone]   Mahlon Gammon, MD  Patient Care Team: Mahlon Gammon, MD as PCP - General (Internal Medicine)  Extended Emergency Contact Information Primary Emergency Contact: Sandeen,Wilmot III Address: [address]          [place], [place] [address] [place] Home Phone: [phone] Mobile Phone: [phone] Relation: Son Secondary Emergency Contact: Dorene Ar Home Phone: [phone] Mobile Phone: [phone] Relation: Son  Code Status:   Goals of care: Advanced Directive information    01/20/2023    1:21 PM  Advanced Directives  Does Patient Have a Medical Advance Directive? Yes  Type of Estate agent of [place];Living will;Out of facility DNR (pink MOST or yellow form)     Chief Complaint  Patient presents with   Medical Management of Chronic Issues    Patient is being seen for a 3 month follow up with labs    Immunizations    Discussed the need for flu and covid vaccine    Health Maintenance    Patient is due for eye exam and AWV    HPI:  Pt is a 89 y.o. female seen today for medical management of chronic diseases.    PMH significant for diverticulosis, loose stools, HLD, macular degeneration, arthritis, hammer toe, chronic cystitis, gastric ulcer, gallstones, HTN, GERD, hearing loss .  Reports left shoulder pain, saw ortho and received a injection around early July which by her account it did not help. She has been doing exercises which has helped. She was told if it did not improve she would need surgery. She knits all the time and thinks this may be causing an issue. She reports taking aleve for pain prn. Does seem to help. She says due to her age she does not want to have surgery and would like a therapy eval here at [facility] for an exercise routine   Reports more bladder leakage over  times, uses pads.   Has right hallux valgus  07/14/23 LDL 73 TC 246 Tri 150 Pt off statin due to muscle aches  She has chronic edema in the RLE which has not changed. Ultra sound of RLE in Jan of 2022 showed no acute process.   Had prior hx of hematuria no current issues  Walks at least 1 mile per day Plays golf twice a week  Has not had any issues with diverticulitis and ever since then has loose stools if she doesn't take metamucil  Sees Dr Vanessa Barbara for macular degeneration   A1C 6.2 07/15/22, was diagnosed with DM after a hospitalization for pna and took steroids. Fasting glucose 112 07/14/23  Mammogram:aged out Dexa: declined  Past Medical History:  Diagnosis Date   Allergy    Arthritis    Back pain    Chronic cystitis with hematuria    Colon polyps    Cystocele, unspecified (CODE)    Diverticulitis    Dyslipidemia    Gallstones    Gastric ulcer    Gastritis    GERD (gastroesophageal reflux disease)    Hearing loss    Hemorrhoids    Hyperlipidemia    Hypertension    Hypertensive retinopathy    OU   Impaired fasting glucose    Macular degeneration    Dry OU   Melanoma in situ of neck (HCC)    Renal insufficiency    Past Surgical History:  Procedure Laterality Date   APPENDECTOMY     CATARACT EXTRACTION Bilateral    Dr. Alferd Patee - [place], [place]   CHOLECYSTECTOMY     with appendectomy   EYE SURGERY Bilateral    Cat Sx - Dr. Alferd Patee - [place], [place]   THROAT SURGERY     calcified ligament   TONSILLECTOMY      Allergies  Allergen Reactions   Oysters [Shellfish Allergy] Swelling   Sulfa Antibiotics     CANNOT REMEMBER REACTION    Outpatient Encounter Medications as of 07/20/2023  Medication Sig   glucose blood (RELION PREMIER TEST) test strip Use to test blood sugar up to 3 times daily. Dx: E11.65   influenza vaccine adjuvanted (FLUAD QUADRIVALENT) 0.5 ML injection Inject into the muscle.   Multiple Vitamins-Minerals (ICAPS AREDS 2 PO) Take 1 tablet by  mouth 2 (two) times daily.   psyllium (METAMUCIL) 58.6 % packet Take 1 packet by mouth daily.   No facility-administered encounter medications on file as of 07/20/2023.    Review of Systems  Constitutional:  Negative for activity change, appetite change, chills, diaphoresis, fatigue, fever and unexpected weight change.  HENT:  Negative for congestion.   Respiratory:  Negative for cough, shortness of breath and wheezing.   Cardiovascular:  Positive for leg swelling (slight on right which is chronic). Negative for chest pain and palpitations.  Gastrointestinal:  Negative for abdominal distention, abdominal pain, constipation and diarrhea.  Genitourinary:  Negative for difficulty urinating and dysuria.       Incontinence  Musculoskeletal:  Positive for arthralgias (left shoulder). Negative for back pain, gait problem, joint swelling and myalgias.  Neurological:  Negative for dizziness, tremors, seizures, syncope, facial asymmetry, speech difficulty, weakness, light-headedness, numbness and headaches.  Psychiatric/Behavioral:  Negative for agitation, behavioral problems and confusion.     Immunization History  Administered Date(s) Administered   Fluad Quad(high Dose 65+) 08/30/2021, 07/29/2022   Influenza, High Dose Seasonal PF 08/05/2019, 08/31/2020   Influenza-Unspecified 10/27/2017   Moderna SARS-COV2 Booster Vaccination 09/11/2020, 08/09/2021   Moderna Sars-Covid-2 Vaccination 11/06/2019, 12/06/2019, 09/11/2020, 04/06/2021, 04/06/2021   PNEUMOCOCCAL CONJUGATE-20 06/07/2022   Zoster Recombinant(Shingrix) 06/07/2022, 08/07/2022   Pertinent  Health Maintenance Due  Topic Date Due   INFLUENZA VACCINE  05/28/2023   OPHTHALMOLOGY EXAM  07/09/2023   HEMOGLOBIN A1C  07/18/2023   DEXA SCAN  07/22/2023 (Originally 11/04/1998)   FOOT EXAM  07/23/2023      01/18/2022   11:00 PM 01/19/2022   10:00 PM 01/20/2022    7:36 AM 03/18/2022    3:31 PM 01/20/2023    1:20 PM  Fall Risk  Falls in the  past year?    0 0  Was there an injury with Fall?    0 0  Fall Risk Category Calculator    0 0  Fall Risk Category (Retired)    Low   (RETIRED) Patient Fall Risk Level Moderate fall risk Moderate fall risk Moderate fall risk Low fall risk   Patient at Risk for Falls Due to    No Fall Risks No Fall Risks  Fall risk Follow up    Falls evaluation completed Falls evaluation completed   Functional Status Survey:    There were no vitals filed for this visit.  There is no height or weight on file to calculate BMI.  Physical Exam Vitals and nursing note reviewed.  Constitutional:      General: She is not in acute distress.    Appearance: She is not diaphoretic.  HENT:     Head: Normocephalic and atraumatic.     Right Ear: Tympanic membrane and external ear normal.     Left Ear: Tympanic membrane and external ear normal.     Nose: Nose normal. No rhinorrhea.     Mouth/Throat:     Mouth: Mucous membranes are moist.     Pharynx: Oropharynx is clear.  Eyes:     Conjunctiva/sclera: Conjunctivae normal.     Pupils: Pupils are equal, round, and reactive to light.  Neck:     Vascular: No JVD.  Cardiovascular:     Rate and Rhythm: Normal rate and regular rhythm.     Heart sounds: No murmur heard. Pulmonary:     Effort: Pulmonary effort is normal. No respiratory distress.     Breath sounds: Normal breath sounds. No wheezing.  Abdominal:     General: Bowel sounds are normal. There is no distension.     Palpations: Abdomen is soft.  Musculoskeletal:     Left shoulder: Crepitus present. No swelling, deformity, effusion, laceration, tenderness or bony tenderness. Decreased range of motion. Decreased strength. Normal pulse.     Cervical back: No rigidity or tenderness.     Right lower leg: Edema (trace) present.     Left lower leg: No edema.  Lymphadenopathy:     Cervical: No cervical adenopathy.  Skin:    General: Skin is warm and dry.  Neurological:     Mental Status: She is alert and  oriented to person, place, and time.  Psychiatric:        Mood and Affect: Mood normal.     Labs reviewed: Recent Labs    01/15/23 0000 07/14/23 0000  NA 140 146  K 4.3 4.2  CL 106 108  CO2 23* 24*  BUN 19 13  CREATININE 0.7 0.7  CALCIUM 9.1 8.7   Recent Labs    07/14/23 0000  AST 21  ALT 13  ALKPHOS 115  ALBUMIN 3.8   Recent Labs    01/15/23 0000 07/14/23 0000  WBC 6.4 5.5  HGB 13.8 12.6  HCT 41 38  PLT 210 229   Lab Results  Component Value Date   TSH 2.49 07/14/2023   Lab Results  Component Value Date   HGBA1C 6.3 01/15/2023   Lab Results  Component Value Date   CHOL 146 07/14/2023   HDL 43 07/14/2023   LDLCALC 73 07/14/2023   TRIG 150 07/14/2023    Significant Diagnostic Results in last 30 days:  No results found.  Assessment/Plan  1. Acute pain of left shoulder  - diclofenac Sodium (VOLTAREN) 1 % GEL; Apply 2 g topically 4 (four) times daily.  OT eval and tx  2. Mixed stress and urge urinary incontinence Worsening, discussed urology referral She wants to hold off for now  3. Pure hypercholesterolemia Improved  4. Type 2 diabetes mellitus with hyperglycemia, without long-term current use of insulin (HCC) Fasting glucose 112 Not on meds Was steroid induced after a hospitalization  5. Venous insufficiency Mild Elevation Low sodium diet  Discussed bone density study which she previously declined, she will think about and discuss more in the AWV that is scheduled.   F/U in 6 months with Dr Chales Abrahams.   Labs/tests ordered:  CBC BMP A1C in 6 months    Total time :  time greater than 50% of total time spent doing pt counseling and coordination of care

## 2023-07-20 NOTE — Patient Instructions (Signed)
Recommend flu and covid vaccine

## 2023-08-06 NOTE — Progress Notes (Signed)
[facility] - Clinic Note  08/13/2023     CHIEF COMPLAINT Patient presents for Retina Follow Up   HISTORY OF PRESENT ILLNESS: Yolanda Burke is a 89 y.o. female who presents to the clinic today for:   HPI     Retina Follow Up   Patient presents with  Dry AMD.  In both eyes.  This started 6 months ago.  I, the attending physician,  performed the HPI with the patient and updated documentation appropriately.        Comments   Patient here for 6 months retina follow up for non exu ARMD OU. Patient states vision not doing very well. Has reading glasses over regular glasses to to read. Can still read but getting harder. No eye pain. This summer OD was feeling funny when touched. Uses Refresh prn.       Last edited by Rennis Chris, MD on 08/16/2023  1:28 AM.    Pt states "there is something going on with my eyes", she feels like her vision has gotten worse in the last month of 2, she is having a hard time reading, she states her right eye waters a lot, and "tingles" when she presses on the upper lid, she uses Refresh   Referring physician: Mahlon Gammon, MD [address] [place],  [place] [address]  HISTORICAL INFORMATION:   Selected notes from the MEDICAL RECORD NUMBER Former pt at [facility], pt wants to switch retina specialists LEE:  Ocular Hx- PMH-    CURRENT MEDICATIONS: No current outpatient medications on file. (Ophthalmic Drugs)   No current facility-administered medications for this visit. (Ophthalmic Drugs)   Current Outpatient Medications (Other)  Medication Sig   diclofenac Sodium (VOLTAREN) 1 % GEL Apply 2 g topically 4 (four) times daily.   influenza vaccine adjuvanted (FLUAD QUADRIVALENT) 0.5 ML injection Inject into the muscle.   Multiple Vitamins-Minerals (ICAPS AREDS 2 PO) Take 1 tablet by mouth 2 (two) times daily.   psyllium (METAMUCIL) 58.6 % packet Take 1 packet by mouth daily.   No current facility-administered  medications for this visit. (Other)   REVIEW OF SYSTEMS: ROS   Positive for: Gastrointestinal, Skin, Genitourinary, Musculoskeletal, HENT, Eyes Negative for: Constitutional, Neurological, Endocrine, Cardiovascular, Respiratory, Psychiatric, Allergic/Imm, Heme/Lymph Last edited by Laddie Aquas, COA on 08/13/2023  9:41 AM.     ALLERGIES Allergies  Allergen Reactions   Oysters [Shellfish Allergy] Swelling   Sulfa Antibiotics     CANNOT REMEMBER REACTION   PAST MEDICAL HISTORY Past Medical History:  Diagnosis Date   Allergy    Arthritis    Back pain    Chronic cystitis with hematuria    Colon polyps    Cystocele, unspecified (CODE)    Diverticulitis    Dyslipidemia    Gallstones    Gastric ulcer    Gastritis    GERD (gastroesophageal reflux disease)    Hearing loss    Hemorrhoids    Hyperlipidemia    Hypertension    Hypertensive retinopathy    OU   Impaired fasting glucose    Macular degeneration    Dry OU   Melanoma in situ of neck (HCC)    Renal insufficiency    Past Surgical History:  Procedure Laterality Date   APPENDECTOMY     CATARACT EXTRACTION Bilateral    Dr. Alferd Patee - [place], [place]   CHOLECYSTECTOMY     with appendectomy   EYE SURGERY Bilateral    Cat Sx -  Dr. Alferd Patee - [place], [place]   THROAT SURGERY     calcified ligament   TONSILLECTOMY     FAMILY HISTORY Family History  Problem Relation Age of Onset   Heart failure Mother    Dementia Mother    Pneumonia Father    Kidney failure Father    Heart disease Brother    Bladder Cancer Brother    SOCIAL HISTORY Social History   Tobacco Use   Smoking status: Former    Current packs/day: 0.00    Types: Cigarettes    Quit date: 03/14/1985    Years since quitting: 38.4   Smokeless tobacco: Never   Tobacco comments:    Smoked until age 55  Vaping Use   Vaping status: Never Used  Substance Use Topics   Alcohol use: Yes    Comment: social-occasional   Drug use: Never       OPHTHALMIC  EXAM:  Base Eye Exam     Visual Acuity (Snellen - Linear)       Right Left   Dist cc 20/50 -1 20/60   Dist ph cc NI 20/60 +2    Correction: Glasses         Tonometry (Tonopen, 9:37 AM)       Right Left   Pressure 11 11         Pupils       Dark Light Shape React APD   Right 3 2 Round Brisk None   Left 3 2 Round Brisk None         Visual Fields (Counting fingers)       Left Right    Full Full         Extraocular Movement       Right Left    Full, Ortho Full, Ortho         Neuro/Psych     Oriented x3: Yes   Mood/Affect: Normal         Dilation     Both eyes: 1.0% Mydriacyl, 2.5% Phenylephrine @ 9:37 AM           Slit Lamp and Fundus Exam     Slit Lamp Exam       Right Left   Lids/Lashes Dermatochalasis - upper lid Dermatochalasis - upper lid   Conjunctiva/Sclera White and quiet White and quiet   Cornea arcus, 1+Punctate epithelial erosions, well healed cataract wound arcus, 1-2+ Punctate epithelial erosions, well healed cataract wound, mild tear film debris   Anterior Chamber deep and clear deep and clear   Iris Round and dilated Round and dilated   Lens PC IOL in good position with open PC PC IOL in good position with open PC   Anterior Vitreous Vitreous syneresis, Posterior vitreous detachment Vitreous syneresis, silicone micro bubbles, Posterior vitreous detachment         Fundus Exam       Right Left   Disc mild Pallor, Sharp rim, Compact Pink and Sharp, Compact   C/D Ratio 0.2 0.1   Macula Blunted foveal reflex, central GA with refractile subretinal deposits within, scattered drusen, no heme Flat, blunted foveal reflex, central GA with pigment ring, scattered drusen, no heme or edema   Vessels attenuated, Tortuous attenuated, Tortuous   Periphery Attached, no heme, No RT/RD Attached, no heme           Refraction     Wearing Rx       Sphere Cylinder Axis Add   Right -2.25 +2.00 015 +  3.00   Left -2.25 +2.75 155  +3.00           IMAGING AND PROCEDURES  Imaging and Procedures for 08/13/2023  OCT, Retina - OU - Both Eyes       Right Eye Quality was good. Central Foveal Thickness: 166. Progression has been stable. Findings include no IRF, no SRF, abnormal foveal contour, retinal drusen , subretinal hyper-reflective material, intraretinal hyper-reflective material, pigment epithelial detachment, outer retinal atrophy (Severe central thinning with SRHM / scarring and ORA).   Left Eye Quality was good. Central Foveal Thickness: 107. Progression has been stable. Findings include no IRF, no SRF, abnormal foveal contour, retinal drusen , pigment epithelial detachment, inner retinal atrophy, outer retinal atrophy (Severe central thinning / atrophy -- ?mild progression on en face).   Notes *Images captured and stored on drive  Diagnosis / Impression:  non-exu ARMD OU OD: Severe central thinning with SRHM / scarring and ORA OS: Severe central thinning / atrophy -- ?mild progression on en face  Clinical management:  See below  Abbreviations: NFP - Normal foveal profile. CME - cystoid macular edema. PED - pigment epithelial detachment. IRF - intraretinal fluid. SRF - subretinal fluid. EZ - ellipsoid zone. ERM - epiretinal membrane. ORA - outer retinal atrophy. ORT - outer retinal tubulation. SRHM - subretinal hyper-reflective material. IRHM - intraretinal hyper-reflective material            ASSESSMENT/PLAN:    ICD-10-CM   1. Advanced atrophic nonexudative age-related macular degeneration of both eyes with subfoveal involvement  H35.3134 OCT, Retina - OU - Both Eyes    2. Essential hypertension  I10     3. Hypertensive retinopathy of both eyes  H35.033     4. Pseudophakia of both eyes  Z96.1     5. PCO (posterior capsular opacification), left  H26.492     6. PCO (posterior capsular opacification), bilateral  H26.493      1. Age related macular degeneration, non-exudative, both eyes  -  former pt at [facility]) and in [place]; moved from [place] in 2020)  - history of intravitreal injections with Reymundo Poll, last being ~2016  - exam shows central GA OU, OD with central refractile drusen and scarring  - BCVA 20/50 OD, 20/60 OS -- stable OU  - discussed natural progression of non-ex ARMD  - Continue AREDS 2 supplements and amsler grid monitoring  - f/u 6 months, DFE, OCT  2,3. Hypertensive retinopathy OU - discussed importance of tight BP control - monitor  4. Pseudophakia OU  - s/p CE/IOL OU (Dr. Alferd Patee, [place], [place])  - IOL in good position, doing well  - monitor  Ophthalmic Meds Ordered this visit:  No orders of the defined types were placed in this encounter.    Return in about 6 months (around 02/11/2024) for f/u non-exu ARMD OU, DFE, OCT.  There are no Patient Instructions on file for this visit.  This document serves as a record of services personally performed by Karie Chimera, MD, PhD. It was created on their behalf by Glee Arvin. Manson Passey, OA an ophthalmic technician. The creation of this record is the provider's dictation and/or activities during the visit.    Electronically signed by: Glee Arvin. Manson Passey, OA 08/16/23 1:30 AM  Karie Chimera, M.D., Ph.D. Diseases & Surgery of the Retina and Vitreous [facility] 08/13/2023   I have reviewed the above documentation for accuracy and completeness, and I agree with the above.  Karie Chimera, M.D., Ph.D. 08/16/23 1:30 AM  Abbreviations: M myopia (nearsighted); A astigmatism; H hyperopia (farsighted); P presbyopia; Mrx spectacle prescription;  CTL contact lenses; OD right eye; OS left eye; OU both eyes  XT exotropia; ET esotropia; PEK punctate epithelial keratitis; PEE punctate epithelial erosions; DES dry eye syndrome; MGD meibomian gland dysfunction; ATs artificial tears; PFAT's preservative free artificial tears; NSC nuclear sclerotic cataract; PSC posterior subcapsular  cataract; ERM epi-retinal membrane; PVD posterior vitreous detachment; RD retinal detachment; DM diabetes mellitus; DR diabetic retinopathy; NPDR non-proliferative diabetic retinopathy; PDR proliferative diabetic retinopathy; CSME clinically significant macular edema; DME diabetic macular edema; dbh dot blot hemorrhages; CWS cotton wool spot; POAG primary open angle glaucoma; C/D cup-to-disc ratio; HVF humphrey visual field; GVF goldmann visual field; OCT optical coherence tomography; IOP intraocular pressure; BRVO Branch retinal vein occlusion; CRVO central retinal vein occlusion; CRAO central retinal artery occlusion; BRAO branch retinal artery occlusion; RT retinal tear; SB scleral buckle; PPV pars plana vitrectomy; VH Vitreous hemorrhage; PRP panretinal laser photocoagulation; IVK intravitreal kenalog; VMT vitreomacular traction; MH Macular hole;  NVD neovascularization of the disc; NVE neovascularization elsewhere; AREDS age related eye disease study; ARMD age related macular degeneration; POAG primary open angle glaucoma; EBMD epithelial/anterior basement membrane dystrophy; ACIOL anterior chamber intraocular lens; IOL intraocular lens; PCIOL posterior chamber intraocular lens; Phaco/IOL phacoemulsification with intraocular lens placement; PRK photorefractive keratectomy; LASIK laser assisted in situ keratomileusis; HTN hypertension; DM diabetes mellitus; COPD chronic obstructive pulmonary disease

## 2023-08-13 ENCOUNTER — Encounter (INDEPENDENT_AMBULATORY_CARE_PROVIDER_SITE_OTHER): Payer: Self-pay | Admitting: Ophthalmology

## 2023-08-13 ENCOUNTER — Ambulatory Visit (INDEPENDENT_AMBULATORY_CARE_PROVIDER_SITE_OTHER): Payer: Medicare Other | Admitting: Ophthalmology

## 2023-08-13 DIAGNOSIS — H353134 Nonexudative age-related macular degeneration, bilateral, advanced atrophic with subfoveal involvement: Secondary | ICD-10-CM | POA: Diagnosis not present

## 2023-08-13 DIAGNOSIS — I10 Essential (primary) hypertension: Secondary | ICD-10-CM

## 2023-08-13 DIAGNOSIS — H35033 Hypertensive retinopathy, bilateral: Secondary | ICD-10-CM | POA: Diagnosis not present

## 2023-08-13 DIAGNOSIS — H26492 Other secondary cataract, left eye: Secondary | ICD-10-CM

## 2023-08-13 DIAGNOSIS — H26493 Other secondary cataract, bilateral: Secondary | ICD-10-CM

## 2023-08-13 DIAGNOSIS — Z961 Presence of intraocular lens: Secondary | ICD-10-CM | POA: Diagnosis not present

## 2023-08-16 ENCOUNTER — Encounter (INDEPENDENT_AMBULATORY_CARE_PROVIDER_SITE_OTHER): Payer: Self-pay | Admitting: Ophthalmology

## 2023-09-14 ENCOUNTER — Encounter: Payer: Medicare Other | Admitting: Adult Health

## 2023-10-12 ENCOUNTER — Emergency Department (HOSPITAL_COMMUNITY)
Admission: EM | Admit: 2023-10-12 | Discharge: 2023-10-12 | Disposition: A | Discharge status: Home / Self Care | Payer: Medicare Other | Attending: Emergency Medicine | Admitting: Emergency Medicine

## 2023-10-12 ENCOUNTER — Emergency Department (HOSPITAL_COMMUNITY): Payer: Medicare Other

## 2023-10-12 ENCOUNTER — Encounter (HOSPITAL_COMMUNITY): Payer: Self-pay | Admitting: Emergency Medicine

## 2023-10-12 ENCOUNTER — Other Ambulatory Visit: Payer: Self-pay

## 2023-10-12 DIAGNOSIS — R079 Chest pain, unspecified: Secondary | ICD-10-CM | POA: Diagnosis present

## 2023-10-12 DIAGNOSIS — R0789 Other chest pain: Secondary | ICD-10-CM | POA: Diagnosis not present

## 2023-10-12 LAB — HEPATIC FUNCTION PANEL
ALT: 14 U/L (ref 0–44)
AST: 17 U/L (ref 15–41)
Albumin: 3.3 g/dL — ABNORMAL LOW (ref 3.5–5.0)
Alkaline Phosphatase: 88 U/L (ref 38–126)
Bilirubin, Direct: 0.1 mg/dL (ref 0.0–0.2)
Indirect Bilirubin: 0.6 mg/dL (ref 0.3–0.9)
Total Bilirubin: 0.7 mg/dL (ref <1.2)
Total Protein: 6.2 g/dL — ABNORMAL LOW (ref 6.5–8.1)

## 2023-10-12 LAB — CBC
HCT: 37.3 % (ref 36.0–46.0)
Hemoglobin: 12.4 g/dL (ref 12.0–15.0)
MCH: 28.3 pg (ref 26.0–34.0)
MCHC: 33.2 g/dL (ref 30.0–36.0)
MCV: 85.2 fL (ref 80.0–100.0)
Platelets: 209 10*3/uL (ref 150–400)
RBC: 4.38 MIL/uL (ref 3.87–5.11)
RDW: 13.1 % (ref 11.5–15.5)
WBC: 8.2 10*3/uL (ref 4.0–10.5)
nRBC: 0 % (ref 0.0–0.2)

## 2023-10-12 LAB — BASIC METABOLIC PANEL
Anion gap: 12 (ref 5–15)
BUN: 11 mg/dL (ref 8–23)
CO2: 22 mmol/L (ref 22–32)
Calcium: 8.8 mg/dL — ABNORMAL LOW (ref 8.9–10.3)
Chloride: 104 mmol/L (ref 98–111)
Creatinine, Ser: 0.71 mg/dL (ref 0.44–1.00)
GFR, Estimated: >60 mL/min (ref >60)
Glucose, Bld: 127 mg/dL — ABNORMAL HIGH (ref 70–99)
Potassium: 3.8 mmol/L (ref 3.5–5.1)
Sodium: 138 mmol/L (ref 135–145)

## 2023-10-12 LAB — LIPASE, BLOOD: Lipase: 21 U/L (ref 11–51)

## 2023-10-12 LAB — TROPONIN I (HIGH SENSITIVITY)
Troponin I (High Sensitivity): 7 ng/L (ref <18)
Troponin I (High Sensitivity): 7 ng/L (ref <18)

## 2023-10-12 LAB — D-DIMER, QUANTITATIVE: D-Dimer, Quant: 0.59 ug{FEU}/mL — ABNORMAL HIGH (ref 0.00–0.50)

## 2023-10-12 NOTE — ED Triage Notes (Signed)
Pt BIB EMS from [facility] for chest pain under her left breast since 2200. Pt states that she fells a little SHOB like "she can't get her words out without having to catch her breath." Pt had cough x 2 weeks ago, had a negative covid. ASA 324mg  PTA.

## 2023-10-12 NOTE — ED Provider Notes (Signed)
[place] EMERGENCY DEPARTMENT AT [facility] Provider Note   CSN: [id number] Arrival date & time: 10/12/23  0158     History  Chief Complaint  Patient presents with   Chest Pain    Yolanda Burke is a 89 y.o. female.  The history is provided by the patient, a relative and medical records.  Chest Pain Yolanda Burke is a 89 y.o. female who presents to the Emergency Department complaining of chest pain.  She presents to the emergency department for evaluation of left anterior lower chest pain that started abruptly when she got up from dating and watching TV.  Pain was described as a sharp pain that was constant for 3 hours with difficulty breathing.  Pain is nonradiating.  She does have a history of shoulder issues on the left side and thought initially that the pain was related to that.  No fevers, nausea, vomiting, abdominal pain.  No prior similar symptoms.  She did take an aspirin prior to ED arrival. She does not take routine medications.  No known medical problems.    Home Medications Prior to Admission medications   Medication Sig Start Date End Date Taking? Authorizing Provider  diclofenac Sodium (VOLTAREN) 1 % GEL Apply 2 g topically 4 (four) times daily. 07/20/23   Fletcher Anon, NP  influenza vaccine adjuvanted (FLUAD QUADRIVALENT) 0.5 ML injection Inject into the muscle. 07/28/22   Judyann Munson, MD  Multiple Vitamins-Minerals (ICAPS AREDS 2 PO) Take 1 tablet by mouth 2 (two) times daily.    [provider]  psyllium (METAMUCIL) 58.6 % packet Take 1 packet by mouth daily.    [provider]      Allergies    Oysters [shellfish allergy] and Sulfa antibiotics    Review of Systems   Review of Systems  Cardiovascular:  Positive for chest pain.    Physical Exam Updated Vital Signs BP (!) 163/68   Pulse 66   Temp 98.6 F (37 C)   Resp 16   Ht 5\' 3"  (1.6 m)   Wt 63 kg   SpO2 98%   BMI 24.60 kg/m  Physical Exam  ED Results /  Procedures / Treatments   Labs (all labs ordered are listed, but only abnormal results are displayed) Labs Reviewed  BASIC METABOLIC PANEL - Abnormal; Notable for the following components:      Result Value   Glucose, Bld 127 (*)    Calcium 8.8 (*)    All other components within normal limits  HEPATIC FUNCTION PANEL - Abnormal; Notable for the following components:   Total Protein 6.2 (*)    Albumin 3.3 (*)    All other components within normal limits  D-DIMER, QUANTITATIVE - Abnormal; Notable for the following components:   D-Dimer, Quant 0.59 (*)    All other components within normal limits  CBC  LIPASE, BLOOD  TROPONIN I (HIGH SENSITIVITY)  TROPONIN I (HIGH SENSITIVITY)    EKG EKG Interpretation Date/Time:  Monday October 12 2023 02:12:16 EST Ventricular Rate:  75 PR Interval:  216 QRS Duration:  76 QT Interval:  382 QTC Calculation: 426 R Axis:   11  Text Interpretation: Sinus rhythm with 1st degree A-V block Otherwise normal ECG Confirmed by Tilden Fossa [phone]) on 10/12/2023 2:48:15 AM  Radiology DG Chest 2 View Result Date: 10/12/2023 CLINICAL DATA:  Chest pain EXAM: CHEST - 2 VIEW COMPARISON:  Chest x-ray 03/05/2022.  Chest CT 04/14/2022. FINDINGS: There is linear atelectasis or scarring in the right  mid lung and left lung base. The lungs are otherwise clear. There is no pleural effusion or pneumothorax. Cardiomediastinal silhouette is within normal limits. No acute fractures are seen. Degenerative changes affect the spine and shoulders. IMPRESSION: Linear atelectasis or scarring in the right mid lung and left lung base. Electronically Signed   By: Darliss Cheney M.D.   On: 10/12/2023 04:01    Procedures Procedures    Medications Ordered in ED Medications - No data to display  ED Course/ Medical Decision Making/ A&P                                 Medical Decision Making Amount and/or Complexity of Data Reviewed Labs: ordered. Radiology:  ordered.   Patient without significant medical history here for evaluation of left-sided chest pain, associated shortness of breath that started abruptly when getting up.  Pain did last for 3 hours but has completely resolved at time of ED assessment.  EKG is without acute ischemic changes and troponins are negative x 2.  D-dimer is negative based off of age adjustment, otherwise low risk.  Presentation is not consistent with PE, ACS, dissection.  On repeat assessment patient continues to be pain-free.  Discussed with patient unclear source of pain, may be musculoskeletal in nature.  Discussed outpatient follow-up and return precautions.        Final Clinical Impression(s) / ED Diagnoses Final diagnoses:  Other chest pain    Rx / DC Orders ED Discharge Orders     None         Tilden Fossa, MD 10/12/23 [phone]

## 2023-12-31 ENCOUNTER — Encounter: Payer: Self-pay | Admitting: Internal Medicine

## 2024-01-19 ENCOUNTER — Encounter: Admitting: Internal Medicine

## 2024-01-19 ENCOUNTER — Encounter: Payer: Medicare Other | Admitting: Internal Medicine

## 2024-01-19 VITALS — Ht 63.0 in

## 2024-01-22 NOTE — Progress Notes (Signed)
 A user error has taken place.

## 2024-01-26 ENCOUNTER — Non-Acute Institutional Stay: Admitting: Internal Medicine

## 2024-01-26 ENCOUNTER — Encounter: Payer: Self-pay | Admitting: Internal Medicine

## 2024-01-26 VITALS — BP 124/80 | HR 75 | Temp 97.1°F | Resp 18 | Ht 63.0 in | Wt 138.2 lb

## 2024-01-26 DIAGNOSIS — E7800 Pure hypercholesterolemia, unspecified: Secondary | ICD-10-CM

## 2024-01-26 DIAGNOSIS — H3530 Unspecified macular degeneration: Secondary | ICD-10-CM

## 2024-01-26 DIAGNOSIS — M25552 Pain in left hip: Secondary | ICD-10-CM

## 2024-01-26 DIAGNOSIS — G8929 Other chronic pain: Secondary | ICD-10-CM

## 2024-01-26 DIAGNOSIS — I872 Venous insufficiency (chronic) (peripheral): Secondary | ICD-10-CM | POA: Diagnosis not present

## 2024-01-26 DIAGNOSIS — M25512 Pain in left shoulder: Secondary | ICD-10-CM

## 2024-01-26 DIAGNOSIS — E2839 Other primary ovarian failure: Secondary | ICD-10-CM | POA: Diagnosis not present

## 2024-01-26 DIAGNOSIS — R3129 Other microscopic hematuria: Secondary | ICD-10-CM | POA: Diagnosis not present

## 2024-01-26 NOTE — Progress Notes (Unsigned)
 Location:  [facility] [employer] features editor of Service:  Clinic (12)  Provider:   Code Status: *** Goals of Care:     10/12/2023    2:03 AM  Advanced Directives  Does Patient Have a Medical Advance Directive? No     Chief Complaint  Patient presents with   Medical Management of Chronic Issues    6 month follow up. Discuss the need for AWV, Hemogobin A1C, Foot exam, Dexa scan.    HPI: Patient is a 89 y.o. female seen today for medical management of chronic diseases.     Past Medical History:  Diagnosis Date   Allergy    Arthritis    Back pain    Chronic cystitis with hematuria    Colon polyps    Cystocele, unspecified (CODE)    Diverticulitis    Dyslipidemia    Gallstones    Gastric ulcer    Gastritis    GERD (gastroesophageal reflux disease)    Hearing loss    Hemorrhoids    Hyperlipidemia    Hypertension    Hypertensive retinopathy    OU   Impaired fasting glucose    Macular degeneration    Dry OU   Melanoma in situ of neck (HCC)    Renal insufficiency     Past Surgical History:  Procedure Laterality Date   APPENDECTOMY     CATARACT EXTRACTION Bilateral    Dr. Alferd Patee - [place], [place]   CHOLECYSTECTOMY     with appendectomy   EYE SURGERY Bilateral    Cat Sx - Dr. Alferd Patee - [place], [place]   THROAT SURGERY     calcified ligament   TONSILLECTOMY      Allergies  Allergen Reactions   Oysters [Shellfish Allergy] Swelling   Sulfa Antibiotics     CANNOT REMEMBER REACTION    Outpatient Encounter Medications as of 01/26/2024  Medication Sig   influenza vaccine adjuvanted (FLUAD QUADRIVALENT) 0.5 ML injection Inject into the muscle.   Multiple Vitamins-Minerals (ICAPS AREDS 2 PO) Take 1 tablet by mouth 2 (two) times daily.   psyllium (METAMUCIL) 58.6 % packet Take 1 packet by mouth daily.   diclofenac Sodium (VOLTAREN) 1 % GEL Apply 2 g topically 4 (four) times daily. (Patient not taking: Reported on 01/26/2024)   No facility-administered  encounter medications on file as of 01/26/2024.    Review of Systems:  Review of Systems  Health Maintenance  Topic Date Due   DEXA SCAN  Never done   Medicare Annual Wellness (AWV)  08/17/2020   HEMOGLOBIN A1C  07/18/2023   FOOT EXAM  07/23/2023   COVID-19 Vaccine (9 - 2024-25 season) 02/18/2024 (Originally 01/20/2024)   OPHTHALMOLOGY EXAM  05/17/2024 (Originally 07/09/2023)   INFLUENZA VACCINE  05/27/2024   Pneumonia Vaccine 52+ Years old  Completed   Zoster Vaccines- Shingrix  Completed   HPV VACCINES  Aged Out   DTaP/Tdap/Td  Discontinued    Physical Exam: Vitals:   01/26/24 0845  BP: 124/80  Pulse: 75  Resp: 18  Temp: (!) 97.1 F (36.2 C)  SpO2: 98%  Weight: 138 lb 3.2 oz (62.7 kg)  Height: 5\' 3"  (1.6 m)   Body mass index is 24.48 kg/m. Physical Exam  Labs reviewed: Basic Metabolic Panel: Recent Labs    07/14/23 0000 10/12/23 0317  NA 146 138  K 4.2 3.8  CL 108 104  CO2 24* 22  GLUCOSE  --  127*  BUN 13 11  CREATININE 0.7 0.71  CALCIUM 8.7 8.8*  TSH 2.49  --    Liver Function Tests: Recent Labs    07/14/23 0000 10/12/23 0317  AST 21 17  ALT 13 14  ALKPHOS 115 88  BILITOT  --  0.7  PROT  --  6.2*  ALBUMIN 3.8 3.3*   Recent Labs    10/12/23 0317  LIPASE 21   No results for input(s): "AMMONIA" in the last 8760 hours. CBC: Recent Labs    07/14/23 0000 10/12/23 0222  WBC 5.5 8.2  HGB 12.6 12.4  HCT 38 37.3  MCV  --  85.2  PLT 229 209   Lipid Panel: Recent Labs    07/14/23 0000  CHOL 146  HDL 43  LDLCALC 73  TRIG 150   Lab Results  Component Value Date   HGBA1C 6.3 01/15/2023    Procedures since last visit: No results found.  Assessment/Plan There are no diagnoses linked to this encounter.   Labs/tests ordered:  * No order type specified * Next appt:  Visit date not found

## 2024-02-09 ENCOUNTER — Other Ambulatory Visit: Payer: Self-pay | Admitting: Internal Medicine

## 2024-02-09 ENCOUNTER — Ambulatory Visit (HOSPITAL_BASED_OUTPATIENT_CLINIC_OR_DEPARTMENT_OTHER)
Admission: RE | Admit: 2024-02-09 | Discharge: 2024-02-09 | Disposition: A | Discharge status: Home / Self Care | Source: Ambulatory Visit | Attending: Internal Medicine | Admitting: Internal Medicine

## 2024-02-09 DIAGNOSIS — I872 Venous insufficiency (chronic) (peripheral): Secondary | ICD-10-CM

## 2024-02-09 DIAGNOSIS — M25552 Pain in left hip: Secondary | ICD-10-CM

## 2024-02-09 DIAGNOSIS — H3530 Unspecified macular degeneration: Secondary | ICD-10-CM

## 2024-02-09 DIAGNOSIS — E2839 Other primary ovarian failure: Secondary | ICD-10-CM | POA: Insufficient documentation

## 2024-02-09 DIAGNOSIS — R3129 Other microscopic hematuria: Secondary | ICD-10-CM

## 2024-02-09 DIAGNOSIS — E7800 Pure hypercholesterolemia, unspecified: Secondary | ICD-10-CM

## 2024-02-09 DIAGNOSIS — G8929 Other chronic pain: Secondary | ICD-10-CM

## 2024-02-10 NOTE — Progress Notes (Signed)
 [facility] - Clinic Note  02/12/2024     CHIEF COMPLAINT Patient presents for Retina Follow Up   HISTORY OF PRESENT ILLNESS: Yolanda Burke is a 89 y.o. female who presents to the clinic today for:   HPI     Retina Follow Up   Patient presents with  Dry AMD.  In both eyes.  This started 6 months ago.  I, the attending physician,  performed the HPI with the patient and updated documentation appropriately.        Comments   Patient here for 6 months retina follow up for non exu ARMD OU. Patient states vision not doing very well. OD temporally has pain. Uses AT's prn.      Last edited by Valdemar Rogue, MD on 02/14/2024 12:20 AM.     Patient feels the vision is not that good, she is seeing spots.   Referring physician: Charlanne Fredia CROME, MD [address] [place],  [place] [id number]  HISTORICAL INFORMATION:   Selected notes from the MEDICAL RECORD NUMBER Former pt at [facility], pt wants to switch retina specialists LEE:  Ocular Hx- PMH-    CURRENT MEDICATIONS: No current outpatient medications on file. (Ophthalmic Drugs)   No current facility-administered medications for this visit. (Ophthalmic Drugs)   Current Outpatient Medications (Other)  Medication Sig   diclofenac  Sodium (VOLTAREN ) 1 % GEL Apply 2 g topically 4 (four) times daily.   influenza vaccine adjuvanted (FLUAD  QUADRIVALENT) 0.5 ML injection Inject into the muscle.   Multiple Vitamins-Minerals (ICAPS AREDS 2 PO) Take 1 tablet by mouth 2 (two) times daily.   psyllium (METAMUCIL) 58.6 % packet Take 1 packet by mouth daily.   No current facility-administered medications for this visit. (Other)   REVIEW OF SYSTEMS: ROS   Positive for: Gastrointestinal, Skin, Genitourinary, Musculoskeletal, HENT, Eyes Negative for: Constitutional, Neurological, Endocrine, Cardiovascular, Respiratory, Psychiatric, Allergic/Imm, Heme/Lymph Last edited by Orval Asberry RAMAN, COA on 02/12/2024  9:24 AM.      ALLERGIES Allergies  Allergen Reactions   Oysters [Shellfish Allergy] Swelling   Sulfa Antibiotics     CANNOT REMEMBER REACTION   PAST MEDICAL HISTORY Past Medical History:  Diagnosis Date   Allergy    Arthritis    Back pain    Chronic cystitis with hematuria    Colon polyps    Cystocele, unspecified (CODE)    Diverticulitis    Dyslipidemia    Gallstones    Gastric ulcer    Gastritis    GERD (gastroesophageal reflux disease)    Hearing loss    Hemorrhoids    Hyperlipidemia    Hypertension    Hypertensive retinopathy    OU   Impaired fasting glucose    Macular degeneration    Dry OU   Melanoma in situ of neck (HCC)    Renal insufficiency    Past Surgical History:  Procedure Laterality Date   APPENDECTOMY     CATARACT EXTRACTION Bilateral    Dr. Juliana - [place], [place]   CHOLECYSTECTOMY     with appendectomy   EYE SURGERY Bilateral    Cat Sx - Dr. Juliana - [place], [place]   THROAT SURGERY     calcified ligament   TONSILLECTOMY     FAMILY HISTORY Family History  Problem Relation Age of Onset   Heart failure Mother    Dementia Mother    Pneumonia Father    Kidney failure Father    Heart disease Brother  Bladder Cancer Brother    SOCIAL HISTORY Social History   Tobacco Use   Smoking status: Former    Current packs/day: 0.00    Types: Cigarettes    Quit date: 03/14/1985    Years since quitting: 38.9   Smokeless tobacco: Never   Tobacco comments:    Smoked until age 68  Vaping Use   Vaping status: Never Used  Substance Use Topics   Alcohol use: Yes    Comment: social-occasional   Drug use: Never       OPHTHALMIC EXAM:  Base Eye Exam     Visual Acuity (Snellen - Linear)       Right Left   Dist cc 20/50 -2 20/60 -2    Correction: Glasses         Tonometry (Tonopen, 9:22 AM)       Right Left   Pressure 13 13         Pupils       Dark Light Shape React APD   Right 3 2 Round Brisk None   Left 3 2 Round Brisk None          Visual Fields (Counting fingers)       Left Right    Full Full         Extraocular Movement       Right Left    Full, Ortho Full, Ortho         Neuro/Psych     Oriented x3: Yes   Mood/Affect: Normal         Dilation     Both eyes: 1.0% Mydriacyl, 2.5% Phenylephrine @ 9:22 AM           Slit Lamp and Fundus Exam     Slit Lamp Exam       Right Left   Lids/Lashes Dermatochalasis - upper lid Dermatochalasis - upper lid   Conjunctiva/Sclera White and quiet White and quiet   Cornea arcus, trace Punctate epithelial erosions, well healed cataract wound arcus, tracae Punctate epithelial erosions, well healed cataract wound, mild tear film debris   Anterior Chamber deep and clear deep and clear   Iris Round and dilated Round and dilated   Lens PC IOL in good position with open PC PC IOL in good position with open PC   Anterior Vitreous Vitreous syneresis, Posterior vitreous detachment Vitreous syneresis, silicone micro bubbles, Posterior vitreous detachment         Fundus Exam       Right Left   Disc mild Pallor, Sharp rim, Compact Pink and Sharp, Compact   C/D Ratio 0.2 0.1   Macula Blunted foveal reflex, central GA with refractile subretinal deposits within, scattered drusen, no heme, pigment clumping Flat, blunted foveal reflex, central GA with pigment ring, scattered drusen, no heme or edema   Vessels attenuated, Tortuous attenuated, Tortuous   Periphery Attached, no heme, No RT/RD Attached, no heme           Refraction     Wearing Rx       Sphere Cylinder Axis Add   Right -2.25 +2.00 015 +3.00   Left -2.25 +2.75 155 +3.00           IMAGING AND PROCEDURES  Imaging and Procedures for 02/12/2024          ASSESSMENT/PLAN:    ICD-10-CM   1. Advanced atrophic nonexudative age-related macular degeneration of both eyes with subfoveal involvement  H35.3134     2. Essential hypertension  I10     3. Hypertensive retinopathy of both eyes   H35.033     4. Pseudophakia of both eyes  Z96.1      1. Age related macular degeneration, non-exudative, both eyes - former pt at [facility]) and in [place]; moved from [place] in 2020)  - history of intravitreal injections with Tomie, last being ~2016 - exam shows central GA OU, OD with central refractile drusen and scarring  - BCVA 20/50 OD, 20/60 OS -- stable OU  - discussed natural progression of non-ex ARMD  - Continue AREDS 2 supplements and amsler grid monitoring  - f/u 6 months, DFE, OCT  2,3. Hypertensive retinopathy OU - discussed importance of tight BP control - monitor  4. Pseudophakia OU  - s/p CE/IOL OU (Dr. Juliana, [place], [place])  - IOL in good position, doing well  - monitor  Ophthalmic Meds Ordered this visit:  No orders of the defined types were placed in this encounter.    Return in about 6 months (around 08/13/2024) for f/u non Ex. AMD OU , DFE, OCT.  There are no Patient Instructions on file for this visit.  This document serves as a record of services personally performed by Redell JUDITHANN Hans, MD, PhD. It was created on their behalf by Delon Newness COT, an ophthalmic technician. The creation of this record is the provider's dictation and/or activities during the visit.    Electronically signed by: Delon Newness COT 04.16.2025  12:22 AM  This document serves as a record of services personally performed by Redell JUDITHANN Hans, MD, PhD. It was created on their behalf by Wanda GEANNIE Keens, COT an ophthalmic technician. The creation of this record is the provider's dictation and/or activities during the visit.    Electronically signed by:  Wanda GEANNIE Keens, COT  02/14/24 12:22 AM  Redell JUDITHANN Hans, M.D., Ph.D. Diseases & Surgery of the Retina and Vitreous [facility] 02/12/2024   I have reviewed the above documentation for accuracy and completeness, and I agree with the above. Redell JUDITHANN Hans, M.D.,  Ph.D. 02/14/24 12:22 AM   Abbreviations: M myopia (nearsighted); A astigmatism; H hyperopia (farsighted); P presbyopia; Mrx spectacle prescription;  CTL contact lenses; OD right eye; OS left eye; OU both eyes  XT exotropia; ET esotropia; PEK punctate epithelial keratitis; PEE punctate epithelial erosions; DES dry eye syndrome; MGD meibomian gland dysfunction; ATs artificial tears; PFAT's preservative free artificial tears; NSC nuclear sclerotic cataract; PSC posterior subcapsular cataract; ERM epi-retinal membrane; PVD posterior vitreous detachment; RD retinal detachment; DM diabetes mellitus; DR diabetic retinopathy; NPDR non-proliferative diabetic retinopathy; PDR proliferative diabetic retinopathy; CSME clinically significant macular edema; DME diabetic macular edema; dbh dot blot hemorrhages; CWS cotton wool spot; POAG primary open angle glaucoma; C/D cup-to-disc ratio; HVF humphrey visual field; GVF goldmann visual field; OCT optical coherence tomography; IOP intraocular pressure; BRVO Branch retinal vein occlusion; CRVO central retinal vein occlusion; CRAO central retinal artery occlusion; BRAO branch retinal artery occlusion; RT retinal tear; SB scleral buckle; PPV pars plana vitrectomy; VH Vitreous hemorrhage; PRP panretinal laser photocoagulation; IVK intravitreal kenalog ; VMT vitreomacular traction; MH Macular hole;  NVD neovascularization of the disc; NVE neovascularization elsewhere; AREDS age related eye disease study; ARMD age related macular degeneration; POAG primary open angle glaucoma; EBMD epithelial/anterior basement membrane dystrophy; ACIOL anterior chamber intraocular lens; IOL intraocular lens; PCIOL posterior chamber intraocular lens; Phaco/IOL phacoemulsification with intraocular lens placement; PRK photorefractive keratectomy; LASIK laser assisted in situ keratomileusis; HTN hypertension;  DM diabetes mellitus; COPD chronic obstructive pulmonary disease

## 2024-02-12 ENCOUNTER — Ambulatory Visit (INDEPENDENT_AMBULATORY_CARE_PROVIDER_SITE_OTHER): Payer: Medicare Other | Admitting: Ophthalmology

## 2024-02-12 ENCOUNTER — Encounter (INDEPENDENT_AMBULATORY_CARE_PROVIDER_SITE_OTHER): Payer: Self-pay | Admitting: Ophthalmology

## 2024-02-12 DIAGNOSIS — I10 Essential (primary) hypertension: Secondary | ICD-10-CM | POA: Diagnosis not present

## 2024-02-12 DIAGNOSIS — H353134 Nonexudative age-related macular degeneration, bilateral, advanced atrophic with subfoveal involvement: Secondary | ICD-10-CM

## 2024-02-12 DIAGNOSIS — H35033 Hypertensive retinopathy, bilateral: Secondary | ICD-10-CM

## 2024-02-12 DIAGNOSIS — Z961 Presence of intraocular lens: Secondary | ICD-10-CM | POA: Diagnosis not present

## 2024-02-14 ENCOUNTER — Encounter (INDEPENDENT_AMBULATORY_CARE_PROVIDER_SITE_OTHER): Payer: Self-pay | Admitting: Ophthalmology

## 2024-04-01 ENCOUNTER — Encounter: Payer: Self-pay | Admitting: Internal Medicine

## 2024-05-24 LAB — HEPATIC FUNCTION PANEL
ALT: 12 U/L (ref 7–35)
AST: 21 (ref 13–35)
Alkaline Phosphatase: 103 (ref 25–125)
Bilirubin, Total: 0.6

## 2024-05-24 LAB — BASIC METABOLIC PANEL WITH GFR
BUN: 14 (ref 4–21)
CO2: 19 (ref 13–22)
Chloride: 105 (ref 99–108)
Creatinine: 0.8 — AB (ref 0.5–1.1)
Glucose: 106
Potassium: 4.4 meq/L (ref 3.5–5.1)
Sodium: 142 (ref 137–147)

## 2024-05-24 LAB — CBC AND DIFFERENTIAL
HCT: 40 (ref 36–46)
Hemoglobin: 13.3 (ref 12.0–16.0)
Neutrophils Absolute: 3.6
Platelets: 253 K/uL (ref 150–400)
WBC: 6.1

## 2024-05-24 LAB — COMPREHENSIVE METABOLIC PANEL WITH GFR
Albumin: 4.1 (ref 3.5–5.0)
Calcium: 9.3 (ref 8.7–10.7)
Globulin: 2.5
eGFR: 70

## 2024-05-24 LAB — CBC: RBC: 4.47 (ref 3.87–5.11)

## 2024-05-24 LAB — TSH: TSH: 2.94 (ref 0.41–5.90)

## 2024-05-30 ENCOUNTER — Encounter: Admitting: Adult Health

## 2024-05-31 NOTE — Progress Notes (Signed)
 This encounter was created in error - please disregard.

## 2024-06-06 ENCOUNTER — Encounter: Payer: Self-pay | Admitting: Adult Health

## 2024-06-06 ENCOUNTER — Encounter: Admitting: Adult Health

## 2024-06-06 ENCOUNTER — Non-Acute Institutional Stay: Admitting: Adult Health

## 2024-06-06 VITALS — BP 130/74 | HR 77 | Temp 97.6°F | Ht 63.0 in | Wt 135.2 lb

## 2024-06-06 DIAGNOSIS — Z0000 Encounter for general adult medical examination without abnormal findings: Secondary | ICD-10-CM | POA: Diagnosis not present

## 2024-06-06 NOTE — Patient Instructions (Signed)
 Ms. Yolanda Burke , Thank you for taking time to come for your Medicare Wellness Visit. I appreciate your ongoing commitment to your health goals. Please review the following plan we discussed and let me know if I can assist you in the future.   Screening recommendations/referrals: Colonoscopy aged out   Mammogram aged out Bone Density Due April of 2027 Recommended yearly ophthalmology/optometry visit for glaucoma screening and checkup Recommended yearly dental visit for hygiene and checkup  Vaccinations: Influenza vaccine- due annually in September/October Pneumococcal vaccine up to date  Tdap vaccine recommended  Shingles vaccine up to date     Advanced directives: reviewed recently updated   Conditions/risks identified: NA   Next appointment: 1 year    Preventive Care 88 Years and Older, Female Preventive care refers to lifestyle choices and visits with your health care provider that can promote health and wellness. What does preventive care include? A yearly physical exam. This is also called an annual well check. Dental exams once or twice a year. Routine eye exams. Ask your health care provider how often you should have your eyes checked. Personal lifestyle choices, including: Daily care of your teeth and gums. Regular physical activity. Eating a healthy diet. Avoiding tobacco and drug use. Limiting alcohol use. Practicing safe sex. Taking low-dose aspirin every day. Taking vitamin and mineral supplements as recommended by your health care provider. What happens during an annual well check? The services and screenings done by your health care provider during your annual well check will depend on your age, overall health, lifestyle risk factors, and family history of disease. Counseling  Your health care provider may ask you questions about your: Alcohol use. Tobacco use. Drug use. Emotional well-being. Home and relationship well-being. Sexual activity. Eating  habits. History of falls. Memory and ability to understand (cognition). Work and work Astronomer. Reproductive health. Screening  You may have the following tests or measurements: Height, weight, and BMI. Blood pressure. Lipid and cholesterol levels. These may be checked every 5 years, or more frequently if you are over 88 years old. Skin check. Lung cancer screening. You may have this screening every year starting at age 7 if you have a 30-pack-year history of smoking and currently smoke or have quit within the past 15 years. Fecal occult blood test (FOBT) of the stool. You may have this test every year starting at age 56. Flexible sigmoidoscopy or colonoscopy. You may have a sigmoidoscopy every 5 years or a colonoscopy every 10 years starting at age 65. Hepatitis C blood test. Hepatitis B blood test. Sexually transmitted disease (STD) testing. Diabetes screening. This is done by checking your blood sugar (glucose) after you have not eaten for a while (fasting). You may have this done every 1-3 years. Bone density scan. This is done to screen for osteoporosis. You may have this done starting at age 88. Mammogram. This may be done every 1-2 years. Talk to your health care provider about how often you should have regular mammograms. Talk with your health care provider about your test results, treatment options, and if necessary, the need for more tests. Vaccines  Your health care provider may recommend certain vaccines, such as: Influenza vaccine. This is recommended every year. Tetanus, diphtheria, and acellular pertussis (Tdap, Td) vaccine. You may need a Td booster every 10 years. Zoster vaccine. You may need this after age 88. Pneumococcal 13-valent conjugate (PCV13) vaccine. One dose is recommended after age 88. Pneumococcal polysaccharide (PPSV23) vaccine. One dose is recommended after age 88.  Talk to your health care provider about which screenings and vaccines you need and how  often you need them. This information is not intended to replace advice given to you by your health care provider. Make sure you discuss any questions you have with your health care provider. Document Released: 11/09/2015 Document Revised: 07/02/2016 Document Reviewed: 08/14/2015 Elsevier Interactive Patient Education  2017 [employer].  Fall Prevention in the Home Falls can cause injuries. They can happen to people of all ages. There are many things you can do to make your home safe and to help prevent falls. What can I do on the outside of my home? Regularly fix the edges of walkways and driveways and fix any cracks. Remove anything that might make you trip as you walk through a door, such as a raised step or threshold. Trim any bushes or trees on the path to your home. Use bright outdoor lighting. Clear any walking paths of anything that might make someone trip, such as rocks or tools. Regularly check to see if handrails are loose or broken. Make sure that both sides of any steps have handrails. Any raised decks and porches should have guardrails on the edges. Have any leaves, snow, or ice cleared regularly. Use sand or salt on walking paths during winter. Clean up any spills in your garage right away. This includes oil or grease spills. What can I do in the bathroom? Use night lights. Install grab bars by the toilet and in the tub and shower. Do not use towel bars as grab bars. Use non-skid mats or decals in the tub or shower. If you need to sit down in the shower, use a plastic, non-slip stool. Keep the floor dry. Clean up any water  that spills on the floor as soon as it happens. Remove soap buildup in the tub or shower regularly. Attach bath mats securely with double-sided non-slip rug tape. Do not have throw rugs and other things on the floor that can make you trip. What can I do in the bedroom? Use night lights. Make sure that you have a light by your bed that is easy to  reach. Do not use any sheets or blankets that are too big for your bed. They should not hang down onto the floor. Have a firm chair that has side arms. You can use this for support while you get dressed. Do not have throw rugs and other things on the floor that can make you trip. What can I do in the kitchen? Clean up any spills right away. Avoid walking on wet floors. Keep items that you use a lot in easy-to-reach places. If you need to reach something above you, use a strong step stool that has a grab bar. Keep electrical cords out of the way. Do not use floor polish or wax that makes floors slippery. If you must use wax, use non-skid floor wax. Do not have throw rugs and other things on the floor that can make you trip. What can I do with my stairs? Do not leave any items on the stairs. Make sure that there are handrails on both sides of the stairs and use them. Fix handrails that are broken or loose. Make sure that handrails are as long as the stairways. Check any carpeting to make sure that it is firmly attached to the stairs. Fix any carpet that is loose or worn. Avoid having throw rugs at the top or bottom of the stairs. If you do have throw  rugs, attach them to the floor with carpet tape. Make sure that you have a light switch at the top of the stairs and the bottom of the stairs. If you do not have them, ask someone to add them for you. What else can I do to help prevent falls? Wear shoes that: Do not have high heels. Have rubber bottoms. Are comfortable and fit you well. Are closed at the toe. Do not wear sandals. If you use a stepladder: Make sure that it is fully opened. Do not climb a closed stepladder. Make sure that both sides of the stepladder are locked into place. Ask someone to hold it for you, if possible. Clearly mark and make sure that you can see: Any grab bars or handrails. First and last steps. Where the edge of each step is. Use tools that help you move  around (mobility aids) if they are needed. These include: Canes. Walkers. Scooters. Crutches. Turn on the lights when you go into a dark area. Replace any light bulbs as soon as they burn out. Set up your furniture so you have a clear path. Avoid moving your furniture around. If any of your floors are uneven, fix them. If there are any pets around you, be aware of where they are. Review your medicines with your doctor. Some medicines can make you feel dizzy. This can increase your chance of falling. Ask your doctor what other things that you can do to help prevent falls. This information is not intended to replace advice given to you by your health care provider. Make sure you discuss any questions you have with your health care provider. Document Released: 08/09/2009 Document Revised: 03/20/2016 Document Reviewed: 11/17/2014 Elsevier Interactive Patient Education  2017 [employer].

## 2024-06-06 NOTE — Progress Notes (Signed)
 Subjective:   Yolanda Burke is a 89 y.o. female who presents for Medicare Annual (Subsequent) preventive examination at [facility]   Visit Complete: In person  Patient Medicare AWV questionnaire was completed by the patient on 06/06/24; I have confirmed that all information answered by patient is correct and no changes since this date.  Cardiac Risk Factors include: advanced age (>84men, >80 women)     Objective:    Today's Vitals   06/06/24 1545  BP: 130/74  Pulse: 77  Temp: 97.6 F (36.4 C)  SpO2: 92%  Weight: 135 lb 3.2 oz (61.3 kg)  Height: 5' 3 (1.6 m)   Body mass index is 23.95 kg/m.     06/06/2024    3:38 PM 10/12/2023    2:03 AM 07/20/2023    1:35 PM 01/20/2023    1:21 PM 03/18/2022    3:31 PM 01/19/2022   10:00 PM 01/13/2022    8:13 PM  Advanced Directives  Does Patient Have a Medical Advance Directive? Yes No Yes Yes Yes Yes Yes  Type of Estate agent of [place];Living will;Out of facility DNR (pink MOST or yellow form)  Healthcare Power of [place];Living will;Out of facility DNR (pink MOST or yellow form) Healthcare Power of [place];Living will;Out of facility DNR (pink MOST or yellow form) Healthcare Power of [place];Living will;Out of facility DNR (pink MOST or yellow form) Healthcare Power of [employer] of [place];Living will  Does patient want to make changes to medical advance directive? No - Patient declined  No - Patient declined  No - Patient declined  No - Patient declined  Copy of Healthcare Power of Attorney in Chart? Yes - validated most recent copy scanned in chart (See row information)  Yes - validated most recent copy scanned in chart (See row information)  Yes - validated most recent copy scanned in chart (See row information)  No - copy requested  Pre-existing out of facility DNR order (yellow form or pink MOST form)     Yellow form placed in chart (order not valid for  inpatient use)      Current Medications (verified) Outpatient Encounter Medications as of 06/06/2024  Medication Sig   Multiple Vitamins-Minerals (ICAPS AREDS 2 PO) Take 1 tablet by mouth 2 (two) times daily.   psyllium (METAMUCIL) 58.6 % packet Take 1 packet by mouth daily.   diclofenac  Sodium (VOLTAREN ) 1 % GEL Apply 2 g topically 4 (four) times daily. (Patient not taking: Reported on 06/06/2024)   influenza vaccine adjuvanted (FLUAD  QUADRIVALENT) 0.5 ML injection Inject into the muscle.   No facility-administered encounter medications on file as of 06/06/2024.    Allergies (verified) Oysters [shellfish allergy] and Sulfa antibiotics   History: Past Medical History:  Diagnosis Date   Allergy    Arthritis    Back pain    Chronic cystitis with hematuria    Colon polyps    Cystocele, unspecified (CODE)    Diverticulitis    Dyslipidemia    Gallstones    Gastric ulcer    Gastritis    GERD (gastroesophageal reflux disease)    Hearing loss    Hemorrhoids    Hyperlipidemia    Hypertension    Hypertensive retinopathy    OU   Impaired fasting glucose    Macular degeneration    Dry OU   Melanoma in situ of neck (HCC)    Renal insufficiency    Past Surgical History:  Procedure Laterality Date  APPENDECTOMY     CATARACT EXTRACTION Bilateral    Dr. Juliana - [place], [place]   CHOLECYSTECTOMY     with appendectomy   EYE SURGERY Bilateral    Cat Sx - Dr. 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   Tobacco Use   Smoking status: Former    Current packs/day: 0.00    Types: Cigarettes    Quit date: 03/14/1985    Years since quitting: 39.2   Smokeless tobacco: Never   Tobacco comments:    Smoked until age 82  Vaping Use   Vaping status: Never Used  Substance and Sexual Activity   Alcohol use: Yes    Comment: social-occasional   Drug use: Never   Sexual activity: Not Currently  Other Topics Concern   Not on file  Social History Narrative   Tobacco use, amount per day now: NONE   Past tobacco use, amount per day: TEN   How many years did you use tobacco: 30 YEARS   Alcohol use (drinks per week): 1 OR 2   Diet: REGULAR LOTS OF VEGGIES   Do you drink/eat things with caffeine: TRY NOT   Marital status:      WIDOW                            What year were you married?  1959   Do you live in a house, apartment, assisted living, condo, trailer, etc.? COTTAGE   Is it one or more stories? 1   How many persons live in your home?  1   Do you have pets in your home?( please list) NO   Current or past profession: ORGANIST   Do you exercise?       YES                           Type and how often? WALK EVERYDAY, GOLF   Do you have a living will? YES   Do you have a DNR form?       YES                            If not, do you want to discuss one?   Do you have signed POA/HPOA forms?      YES                  If so, please bring to you appointment   Social Drivers of Health   Financial Resource Strain: Not on file  Food Insecurity: Not on file  Transportation Needs: Not on file  Physical Activity: Not on file  Stress: Not on file  Social Connections: Not on file    Tobacco Counseling Counseling given: Not Answered Tobacco comments: Smoked until  age 64   Clinical Intake:  Pre-visit preparation completed: No  Pain : No/denies pain (currently having recovering from diverticulitis)     BMI - recorded: 23.95 Nutritional Status: BMI of 19-24  Normal Diabetes: No  How often  do you need to have someone help you when you read instructions, pamphlets, or other written materials from your doctor or pharmacy?: 3 - Sometimes (due to visional concerns) What is the last grade level you completed in school?: BA  Interpreter Needed?: No  Information entered by :: Tawni America NP   Activities of Daily Living    06/06/2024    4:03 PM  In your present state of health, do you have any difficulty performing the following activities:  Hearing? 1  Vision? 1  Difficulty concentrating or making decisions? 1  Comment occasional loses things  Walking or climbing stairs? 0  Dressing or bathing? 0  Doing errands, shopping? 0  Preparing Food and eating ? N  Using the Toilet? N  In the past six months, have you accidently leaked urine? Y  Do you have problems with loss of bowel control? N  Managing your Medications? N  Housekeeping or managing your Housekeeping? N    Patient Care Team: Charlanne Fredia CROME, MD as PCP - General (Internal Medicine)  Indicate any recent Medical Services you may have received from other than [facility] providers in the past year (date may be approximate).     Assessment:   This is a routine wellness examination for Alisea.  Hearing/Vision screen Hearing Screening - Comments:: Patient wears hearing aids. Vision Screening - Comments:: Patient wears glasses and has had an eye appointment within the last year.   Goals Addressed             This Visit's Progress    Patient Stated       Maintain current physical and functional status       Depression Screen    06/06/2024    3:37 PM 01/26/2024    8:49 AM 07/20/2023    1:35 PM 01/20/2023    1:21 PM 10/31/2020    8:11 AM 05/09/2020    1:47 PM 05/09/2020    8:40 AM  PHQ 2/9 Scores  PHQ - 2 Score 0 0 0 0 0 0 0    Fall Risk    06/06/2024    3:37 PM 01/26/2024    8:49 AM 07/20/2023    1:35 PM 01/20/2023    1:20 PM 03/18/2022    3:31 PM  Fall Risk   Falls in the past year? 0 0 0 0 0  Number  falls in past yr: 0 0 0 0 0  Injury with Fall? 0 0 0 0 0  Risk for fall due to : No Fall Risks No Fall Risks No Fall Risks No Fall Risks No Fall Risks  Follow up Falls evaluation completed Falls evaluation completed Falls evaluation completed Falls evaluation completed Falls evaluation completed      Data saved with a previous flowsheet row definition    MEDICARE RISK AT HOME: Medicare Risk at Home Any stairs in or around the home?: No If so, are there any without handrails?: No Home free of loose throw rugs in walkways, pet beds, electrical cords, etc?: Yes Adequate lighting in your home to reduce risk of falls?: Yes Life alert?: No Use of a cane, walker or w/c?: No Grab bars in the bathroom?: Yes Shower chair or bench in shower?: No Elevated toilet seat or  a handicapped toilet?: Yes  TIMED UP AND GO:  Was the test performed?  No    Cognitive Function:    11/23/2019    9:41 AM  MMSE - Mini Mental State Exam  Orientation to time 5  Orientation to Place 5  Registration 3  Attention/ Calculation 5  Recall 3  Language- name 2 objects 2  Language- repeat 1  Language- follow 3 step command 3  Language- read & follow direction 1  Write a sentence 1  Copy design 1  Total score 30        06/06/2024    3:40 PM 08/18/2019    2:19 PM  6CIT Screen  What Year? 0 points 0 points  What month? 0 points 0 points  What time? 0 points 3 points  Count back from 20 0 points 0 points  Months in reverse 0 points 0 points  Repeat phrase 0 points 0 points  Total Score 0 points 3 points    Immunizations Immunization History  Administered Date(s) Administered   Fluad  Quad(high Dose 65+) 08/30/2021, 07/29/2022   Influenza, High Dose Seasonal PF 08/05/2019, 08/31/2020   Influenza-Unspecified 10/27/2017, 07/23/2023   Moderna Covid-19 Fall Seasonal Vaccine 69yrs & older 07/23/2023   Moderna SARS-COV2 Booster Vaccination 09/11/2020, 08/09/2021   Moderna Sars-Covid-2 Vaccination  11/06/2019, 12/06/2019, 09/11/2020, 04/06/2021, 04/06/2021   PNEUMOCOCCAL CONJUGATE-20 06/07/2022   Zoster Recombinant(Shingrix) 06/07/2022, 08/07/2022    TDAP status: Due, Education has been provided regarding the importance of this vaccine. Advised may receive this vaccine at local pharmacy or Health Dept. Aware to provide a copy of the vaccination record if obtained from local pharmacy or Health Dept. Verbalized acceptance and understanding.  Flu Vaccine status: Due, Education has been provided regarding the importance of this vaccine. Advised may receive this vaccine at local pharmacy or Health Dept. Aware to provide a copy of the vaccination record if obtained from local pharmacy or Health Dept. Verbalized acceptance and understanding.  Pneumococcal vaccine status: Up to date  Covid-19 vaccine status: Completed vaccines  Qualifies for Shingles Vaccine? Yes   Zostavax completed Yes   Shingrix Completed?: Yes  Screening Tests Health Maintenance  Topic Date Due   Medicare Annual Wellness (AWV)  08/17/2020   OPHTHALMOLOGY EXAM  07/09/2023   COVID-19 Vaccine (9 - 2024-25 season) 01/20/2024   INFLUENZA VACCINE  05/27/2024   Pneumococcal Vaccine: 50+ Years  Completed   DEXA SCAN  Completed   Zoster Vaccines- Shingrix  Completed   Hepatitis B Vaccines  Aged Out   HPV VACCINES  Aged Out   Meningococcal B Vaccine  Aged Out   DTaP/Tdap/Td  Discontinued   FOOT EXAM  Discontinued   HEMOGLOBIN A1C  Discontinued    Health Maintenance  Health Maintenance Due  Topic Date Due   Medicare Annual Wellness (AWV)  08/17/2020   OPHTHALMOLOGY EXAM  07/09/2023   COVID-19 Vaccine (9 - 2024-25 season) 01/20/2024   INFLUENZA VACCINE  05/27/2024    Colorectal cancer screening: No longer required.   Mammogram status: No longer required due to age.  Bone Density status: Completed 02/09/24. Results reflect: Bone density results: OSTEOPENIA. Repeat every 2 years.  Lung Cancer Screening: (Low  Dose CT Chest recommended if Age 25-80 years, 20 pack-year currently smoking OR have quit w/in 15years.) does not qualify.   Lung Cancer Screening Referral: NA  Additional Screening:  Hepatitis C Screening: does not qualify; Completed NA  Vision Screening: Recommended annual ophthalmology exams for early detection of glaucoma and other  disorders of the eye. Is the patient up to date with their annual eye exam?  Yes  Who is the provider or what is the name of the office in which the patient attends annual eye exams? Dr Valdemar If pt is not established with a provider, would they like to be referred to a provider to establish care? No .   Dental Screening: Recommended annual dental exams for proper oral hygiene  Diabetic Foot Exam: NA  Community Resource Referral / Chronic Care Management: CRR required this visit?  No   CCM required this visit?  No     Plan:     I have personally reviewed and noted the following in the patient's chart:   Medical and social history Use of alcohol, tobacco or illicit drugs  Current medications and supplements including opioid prescriptions. Patient is not currently taking opioid prescriptions. Functional ability and status Nutritional status Physical activity Advanced directives List of other physicians Hospitalizations, surgeries, and ER visits in previous 12 months Vitals Screenings to include cognitive, depression, and falls Referrals and appointments  In addition, I have reviewed and discussed with patient certain preventive protocols, quality metrics, and best practice recommendations. A written personalized care plan for preventive services as well as general preventive health recommendations were provided to patient.     Tawni America, NP   06/06/2024   After Visit Summary: (In Person-Printed) AVS printed and given to the patient  Nurse Notes: NA

## 2024-07-26 NOTE — Progress Notes (Signed)
 [facility] - Clinic Note  08/09/2024     CHIEF COMPLAINT Patient presents for Retina Follow Up   HISTORY OF PRESENT ILLNESS: Yolanda Burke is a 89 y.o. female who presents to the clinic today for:   HPI     Retina Follow Up   Patient presents with  Dry AMD.  In both eyes.  This started 6 months ago.  Duration of 6 months.  Since onset it is stable.  I, the attending physician,  performed the HPI with the patient and updated documentation appropriately.        Comments   6 month retina follow up AMD OU pt is reporting vision is about the same she denies any flashes or floaters       Last edited by Valdemar Rogue, MD on 08/14/2024  3:34 PM.     Patient feels the vision is about the same.   Referring physician: Charlanne Fredia CROME, MD [address] [place],  [place] [address]  HISTORICAL INFORMATION:   Selected notes from the MEDICAL RECORD NUMBER Former pt at [facility], pt wants to switch retina specialists LEE:  Ocular Hx- PMH-    CURRENT MEDICATIONS: No current outpatient medications on file. (Ophthalmic Drugs)   No current facility-administered medications for this visit. (Ophthalmic Drugs)   Current Outpatient Medications (Other)  Medication Sig   influenza vaccine adjuvanted (FLUAD  QUADRIVALENT) 0.5 ML injection Inject into the muscle.   Multiple Vitamins-Minerals (ICAPS AREDS 2 PO) Take 1 tablet by mouth 2 (two) times daily.   psyllium (METAMUCIL) 58.6 % packet Take 1 packet by mouth daily.   No current facility-administered medications for this visit. (Other)   REVIEW OF SYSTEMS: ROS   Positive for: Gastrointestinal, Skin, Genitourinary, Musculoskeletal, HENT, Eyes Negative for: Constitutional, Neurological, Endocrine, Cardiovascular, Respiratory, Psychiatric, Allergic/Imm, Heme/Lymph Last edited by Resa Delon ORN, COT on 08/09/2024  9:32 AM.      ALLERGIES Allergies  Allergen Reactions   Oysters [Shellfish  Allergy] Swelling   Sulfa Antibiotics     CANNOT REMEMBER REACTION   PAST MEDICAL HISTORY Past Medical History:  Diagnosis Date   Allergy    Arthritis    Back pain    Chronic cystitis with hematuria    Colon polyps    Cystocele, unspecified (CODE)    Diverticulitis    Dyslipidemia    Gallstones    Gastric ulcer    Gastritis    GERD (gastroesophageal reflux disease)    Hearing loss    Hemorrhoids    Hyperlipidemia    Hypertension    Hypertensive retinopathy    OU   Impaired fasting glucose    Macular degeneration    Dry OU   Melanoma in situ of neck (HCC)    Renal insufficiency    Past Surgical History:  Procedure Laterality Date   APPENDECTOMY     CATARACT EXTRACTION Bilateral    Dr. Juliana - [place], [place]   CHOLECYSTECTOMY     with appendectomy   EYE SURGERY Bilateral    Cat Sx - Dr. Juliana - [place], [place]   THROAT SURGERY     calcified ligament   TONSILLECTOMY     FAMILY HISTORY Family History  Problem Relation Age of Onset   Heart failure Mother    Dementia Mother    Pneumonia Father    Kidney failure Father    Heart disease Brother    Bladder Cancer Brother    SOCIAL HISTORY Social History  Tobacco Use   Smoking status: Former    Current packs/day: 0.00    Types: Cigarettes    Quit date: 03/14/1985    Years since quitting: 39.4   Smokeless tobacco: Never   Tobacco comments:    Smoked until age 89  Vaping Use   Vaping status: Never Used  Substance Use Topics   Alcohol use: Yes    Comment: social-occasional   Drug use: Never       OPHTHALMIC EXAM:  Base Eye Exam     Visual Acuity (Snellen - Linear)       Right Left   Dist cc 20/60 -3 20/60 -2   Dist ph cc NI NI         Tonometry (Tonopen, 9:43 AM)       Right Left   Pressure 15 13         Pupils       Pupils Dark Light Shape React APD   Right PERRL 3 2 Round Brisk None   Left PERRL 3 2 Round Brisk None         Visual Fields       Left Right    Full Full          Extraocular Movement       Right Left    Full, Ortho Full, Ortho         Neuro/Psych     Oriented x3: Yes   Mood/Affect: Normal         Dilation     Both eyes: 2.5% Phenylephrine @ 9:43 AM           Slit Lamp and Fundus Exam     Slit Lamp Exam       Right Left   Lids/Lashes Dermatochalasis - upper lid Dermatochalasis - upper lid   Conjunctiva/Sclera White and quiet White and quiet   Cornea arcus, trace Punctate epithelial erosions, well healed cataract wound arcus, tracae Punctate epithelial erosions, well healed cataract wound, mild tear film debris   Anterior Chamber deep and clear deep and clear   Iris Round and dilated Round and dilated   Lens PC IOL in good position with open PC PC IOL in good position with open PC   Anterior Vitreous Vitreous syneresis, Posterior vitreous detachment Vitreous syneresis, silicone micro bubbles, Posterior vitreous detachment         Fundus Exam       Right Left   Disc mild Pallor, Sharp rim, Compact Pink and Sharp, Compact   C/D Ratio 0.2 0.2   Macula Blunted foveal reflex, central GA with refractile subretinal deposits within, scattered drusen, no heme, pigment clumping Flat, blunted foveal reflex, central GA with pigment ring, scattered drusen, no heme or edema   Vessels attenuated, Tortuous attenuated, Tortuous   Periphery Attached, no heme, No RT/RD, Reticular degeneration Attached, no heme, Reticular degeneration           Refraction     Wearing Rx       Sphere Cylinder Axis Add   Right -2.25 +2.00 015 +3.00   Left -2.25 +2.75 155 +3.00           IMAGING AND PROCEDURES  Imaging and Procedures for 08/09/2024  OCT, Retina - OU - Both Eyes       Right Eye Quality was good. Central Foveal Thickness: 166. Progression has been stable. Findings include no IRF, no SRF, abnormal foveal contour, retinal drusen , subretinal hyper-reflective material, intraretinal hyper-reflective material, pigment  epithelial detachment, outer retinal atrophy (Severe central thinning with SRHM / scarring and ORA; ? Mild progression of ORA/GA).   Left Eye Quality was good. Central Foveal Thickness: 78. Progression has been stable. Findings include no IRF, no SRF, abnormal foveal contour, retinal drusen , pigment epithelial detachment, inner retinal atrophy, outer retinal atrophy (Severe central thinning / atrophy -- mild progression on en face, interval improvement of PED temporal mac).   Notes *Images captured and stored on drive  Diagnosis / Impression:  non-exu ARMD OU OD: Severe central thinning with SRHM / scarring and ORA; ? Mild progression of ORA/GA OS: Severe central thinning / atrophy -- mild progression on en face, interval improvement of PED temporal mac  Clinical management:  See below  Abbreviations: NFP - Normal foveal profile. CME - cystoid macular edema. PED - pigment epithelial detachment. IRF - intraretinal fluid. SRF - subretinal fluid. EZ - ellipsoid zone. ERM - epiretinal membrane. ORA - outer retinal atrophy. ORT - outer retinal tubulation. SRHM - subretinal hyper-reflective material. IRHM - intraretinal hyper-reflective material            ASSESSMENT/PLAN:    ICD-10-CM   1. Advanced atrophic nonexudative age-related macular degeneration of both eyes with subfoveal involvement  H35.3134 OCT, Retina - OU - Both Eyes    CANCELED: OCT, Retina - OU - Both Eyes    2. Essential hypertension  I10     3. Hypertensive retinopathy of both eyes  H35.033     4. Pseudophakia of both eyes  Z96.1     5. PCO (posterior capsular opacification), left  H26.492      1. Age related macular degeneration, non-exudative, both eyes - former pt at [facility]) and in [place]; moved from [place] in 2020)  - history of intravitreal injections with [facility]ie, last being ~2016 - exam shows central GA OU, OD with central refractile drusen and scarring  - BCVA 20/60 from 20/50  OD, 20/60 OS -- stable - discussed minimum vision requirements for an unrestricted [place] drivers license - OCT shows OD: Severe central thinning with SRHM / scarring and ORA; ? Mild progression of ORA/GA, OS: Severe central thinning / atrophy -- mild progression on en face, interval improvement of PED temporal mac  - discussed natural progression of non-ex ARMD  - Continue AREDS 2 supplements and amsler grid monitoring  - f/u 6 months, DFE, OCT  2,3. Hypertensive retinopathy OU - discussed importance of tight BP control - monitor  4. Pseudophakia OU  - s/p CE/IOL OU (Dr. Juliana, [place], [place])  - IOL in good position, doing well  - monitor  Ophthalmic Meds Ordered this visit:  No orders of the defined types were placed in this encounter.    Return in about 6 months (around 02/07/2025) for f/u, Non Ex. AMD, DFE, OCT.  There are no Patient Instructions on file for this visit.  This document serves as a record of services personally performed by Redell JUDITHANN Hans, MD, PhD. It was created on their behalf by Wanda GEANNIE Keens, COT an ophthalmic technician. The creation of this record is the provider's dictation and/or activities during the visit.    Electronically signed by:  Wanda GEANNIE Keens, COT  08/14/24 3:38 PM  Redell JUDITHANN Hans, M.D., Ph.D. Diseases & Surgery of the Retina and Vitreous [facility]  I have reviewed the above documentation for accuracy and completeness, and I agree with the above. Redell JUDITHANN Hans, M.D., Ph.D. 08/14/24 3:39  PM   Abbreviations: M myopia (nearsighted); A astigmatism; H hyperopia (farsighted); P presbyopia; Mrx spectacle prescription;  CTL contact lenses; OD right eye; OS left eye; OU both eyes  XT exotropia; ET esotropia; PEK punctate epithelial keratitis; PEE punctate epithelial erosions; DES dry eye syndrome; MGD meibomian gland dysfunction; ATs artificial tears; PFAT's preservative free artificial tears; NSC nuclear sclerotic  cataract; PSC posterior subcapsular cataract; ERM epi-retinal membrane; PVD posterior vitreous detachment; RD retinal detachment; DM diabetes mellitus; DR diabetic retinopathy; NPDR non-proliferative diabetic retinopathy; PDR proliferative diabetic retinopathy; CSME clinically significant macular edema; DME diabetic macular edema; dbh dot blot hemorrhages; CWS cotton wool spot; POAG primary open angle glaucoma; C/D cup-to-disc ratio; HVF humphrey visual field; GVF goldmann visual field; OCT optical coherence tomography; IOP intraocular pressure; BRVO Branch retinal vein occlusion; CRVO central retinal vein occlusion; CRAO central retinal artery occlusion; BRAO branch retinal artery occlusion; RT retinal tear; SB scleral buckle; PPV pars plana vitrectomy; VH Vitreous hemorrhage; PRP panretinal laser photocoagulation; IVK intravitreal kenalog ; VMT vitreomacular traction; MH Macular hole;  NVD neovascularization of the disc; NVE neovascularization elsewhere; AREDS age related eye disease study; ARMD age related macular degeneration; POAG primary open angle glaucoma; EBMD epithelial/anterior basement membrane dystrophy; ACIOL anterior chamber intraocular lens; IOL intraocular lens; PCIOL posterior chamber intraocular lens; Phaco/IOL phacoemulsification with intraocular lens placement; PRK photorefractive keratectomy; LASIK laser assisted in situ keratomileusis; HTN hypertension; DM diabetes mellitus; COPD chronic obstructive pulmonary disease

## 2024-08-08 ENCOUNTER — Encounter: Payer: Self-pay | Admitting: Adult Health

## 2024-08-08 ENCOUNTER — Encounter: Admitting: Adult Health

## 2024-08-09 ENCOUNTER — Encounter (INDEPENDENT_AMBULATORY_CARE_PROVIDER_SITE_OTHER): Payer: Self-pay | Admitting: Ophthalmology

## 2024-08-09 ENCOUNTER — Ambulatory Visit (INDEPENDENT_AMBULATORY_CARE_PROVIDER_SITE_OTHER): Admitting: Ophthalmology

## 2024-08-09 DIAGNOSIS — H26492 Other secondary cataract, left eye: Secondary | ICD-10-CM

## 2024-08-09 DIAGNOSIS — H353134 Nonexudative age-related macular degeneration, bilateral, advanced atrophic with subfoveal involvement: Secondary | ICD-10-CM

## 2024-08-09 DIAGNOSIS — I10 Essential (primary) hypertension: Secondary | ICD-10-CM

## 2024-08-09 DIAGNOSIS — H35033 Hypertensive retinopathy, bilateral: Secondary | ICD-10-CM

## 2024-08-09 DIAGNOSIS — Z961 Presence of intraocular lens: Secondary | ICD-10-CM

## 2024-08-09 NOTE — Progress Notes (Signed)
 This encounter was created in error - please disregard.

## 2024-08-14 ENCOUNTER — Encounter (INDEPENDENT_AMBULATORY_CARE_PROVIDER_SITE_OTHER): Payer: Self-pay | Admitting: Ophthalmology

## 2024-08-15 ENCOUNTER — Encounter: Admitting: Adult Health

## 2024-09-06 ENCOUNTER — Encounter: Payer: Self-pay | Admitting: Internal Medicine

## 2024-09-27 ENCOUNTER — Non-Acute Institutional Stay: Admitting: Internal Medicine

## 2024-09-27 VITALS — BP 118/80 | HR 63 | Temp 97.0°F | Ht 63.0 in | Wt 138.0 lb

## 2024-09-27 DIAGNOSIS — R3129 Other microscopic hematuria: Secondary | ICD-10-CM

## 2024-09-27 DIAGNOSIS — H3530 Unspecified macular degeneration: Secondary | ICD-10-CM

## 2024-09-27 DIAGNOSIS — G8929 Other chronic pain: Secondary | ICD-10-CM

## 2024-09-27 DIAGNOSIS — M25552 Pain in left hip: Secondary | ICD-10-CM

## 2024-09-27 NOTE — Progress Notes (Signed)
 Location:   [facility]   Place of Service:   Clinic  Provider:   Code Status:  Goals of Care:     06/06/2024    3:38 PM  Advanced Directives  Does Patient Have a Medical Advance Directive? Yes  Type of Estate Agent of [place];Living will;Out of facility DNR (pink MOST or yellow form)  Does patient want to make changes to medical advance directive? No - Patient declined  Copy of Healthcare Power of Attorney in Chart? Yes - validated most recent copy scanned in chart (See row information)     Chief Complaint  Patient presents with   Routine Visit    Patient has concerns about hip and shoulder pain.     HPI: Patient is a 89 y.o. female seen today for medical management of chronic diseases.    h/o Diverticulosis Still has loose Stools sometimes Saw Dr Abran in GI. Did not recommend Colonoscopy Gave her prescription of Cipro  to use of needed Hardly used it Macular Degeneration Still driving but short distance Arthritis H/o Hematuria.Many years ago Checking UA with us  Q 6 months  Lives in [place] in  Discussed the use of AI scribe software for clinical note transcription with the patient, who gave verbal consent to proceed.  History of Present Illness   Yolanda Burke is a 89 year old female who presents for a routine follow-up visit. Chronic OA  She has chronic osteoarthritis with bone-on-bone changes, worse on the left, with mild pain that allows her to swim and play golf twice weekly. She had a corticosteroid injection last spring and was advised by orthopedics not to pursue physical therapy.   She has diverticulosis and uses daily Metamucil to regulate bowel movements. Missing it for 1 to 2 days causes loose stools.  She had pneumonia in 2023 while in [place] and has no current respiratory or voice concerns.  She has mild urinary leakage, mainly when sitting, and does not need frequent pad changes. She had hematuria about 20 years ago. A urine test  this fall was negative for blood and infection. She has not seen a urologist since moving.  Her vision is declining. Her eye doctor advised her to consider stopping driving. She follows with ophthalmology every 3 months.  She recalls blood work in the fall, though the only documented labs are from July and were normal. She received COVID and flu vaccines but has not had the RSV vaccine.      Past Medical History:  Diagnosis Date   Allergy    Arthritis    Back pain    Chronic cystitis with hematuria    Colon polyps    Cystocele, unspecified (CODE)    Diverticulitis    Dyslipidemia    Gallstones    Gastric ulcer    Gastritis    GERD (gastroesophageal reflux disease)    Hearing loss    Hemorrhoids    Hyperlipidemia    Hypertension    Hypertensive retinopathy    OU   Impaired fasting glucose    Macular degeneration    Dry OU   Melanoma in situ of neck (HCC)    Renal insufficiency     Past Surgical History:  Procedure Laterality Date   APPENDECTOMY     CATARACT EXTRACTION Bilateral    Dr. Juliana - [place], [place]   CHOLECYSTECTOMY     with appendectomy   EYE SURGERY Bilateral    Cat Sx - Dr. Juliana - [place], [place]  THROAT SURGERY     calcified ligament   TONSILLECTOMY      Allergies  Allergen Reactions   Oysters [Shellfish Allergy] Swelling   Sulfa Antibiotics     CANNOT REMEMBER REACTION    Outpatient Encounter Medications as of 09/27/2024  Medication Sig   influenza vaccine adjuvanted (FLUAD  QUADRIVALENT) 0.5 ML injection Inject into the muscle.   Multiple Vitamins-Minerals (ICAPS AREDS 2 PO) Take 1 tablet by mouth 2 (two) times daily.   psyllium (METAMUCIL) 58.6 % packet Take 1 packet by mouth daily.   No facility-administered encounter medications on file as of 09/27/2024.    Review of Systems:  Review of Systems  Constitutional:  Negative for activity change and appetite change.  HENT: Negative.    Respiratory:  Negative for cough and shortness of  breath.   Cardiovascular:  Negative for leg swelling.  Gastrointestinal:  Negative for constipation.  Genitourinary: Negative.   Musculoskeletal:  Positive for arthralgias and myalgias. Negative for gait problem.  Skin: Negative.   Neurological:  Negative for dizziness and weakness.  Psychiatric/Behavioral:  Negative for confusion, dysphoric mood and sleep disturbance.     Health Maintenance  Topic Date Due   COVID-19 Vaccine (10 - 2025-26 season) 01/23/2025   OPHTHALMOLOGY EXAM  02/12/2025   Medicare Annual Wellness (AWV)  06/06/2025   Pneumococcal Vaccine: 50+ Years  Completed   Influenza Vaccine  Completed   Bone Density Scan  Completed   Zoster Vaccines- Shingrix  Completed   Meningococcal B Vaccine  Aged Out   DTaP/Tdap/Td  Discontinued   FOOT EXAM  Discontinued   HEMOGLOBIN A1C  Discontinued    Physical Exam: Vitals:   09/27/24 0930  BP: 118/80  Pulse: 63  Temp: (!) 97 F (36.1 C)  SpO2: 99%  Weight: 138 lb (62.6 kg)  Height: 5' 3 (1.6 m)   Body mass index is 24.45 kg/m. Physical Exam Vitals reviewed.  Constitutional:      Appearance: Normal appearance.     Comments: Kyphosis present  HENT:     Head: Normocephalic.     Nose: Nose normal.     Mouth/Throat:     Mouth: Mucous membranes are moist.     Pharynx: Oropharynx is clear.  Eyes:     Pupils: Pupils are equal, round, and reactive to light.  Cardiovascular:     Rate and Rhythm: Normal rate and regular rhythm.     Pulses: Normal pulses.     Heart sounds: Normal heart sounds. No murmur heard. Pulmonary:     Effort: Pulmonary effort is normal.     Breath sounds: Normal breath sounds.  Abdominal:     General: Abdomen is flat. Bowel sounds are normal.     Palpations: Abdomen is soft.  Musculoskeletal:        General: No swelling.     Cervical back: Neck supple.     Comments: Chronic Venous changes  Skin:    General: Skin is warm.  Neurological:     General: No focal deficit present.     Mental  Status: She is alert and oriented to person, place, and time.  Psychiatric:        Mood and Affect: Mood normal.        Thought Content: Thought content normal.     Labs reviewed: Basic Metabolic Panel: Recent Labs    10/12/23 0317 05/24/24 1032  NA 138 142  K 3.8 4.4  CL 104 105  CO2 22 19  GLUCOSE 127*  --  BUN 11 14  CREATININE 0.71 0.8*  CALCIUM 8.8* 9.3  TSH  --  2.94   Liver Function Tests: Recent Labs    10/12/23 0317 05/24/24 1032  AST 17 21  ALT 14 12  ALKPHOS 88 103  BILITOT 0.7  --   PROT 6.2*  --   ALBUMIN 3.3* 4.1   Recent Labs    10/12/23 0317  LIPASE 21   No results for input(s): AMMONIA in the last 8760 hours. CBC: Recent Labs    10/12/23 0222 05/24/24 1032  WBC 8.2 6.1  NEUTROABS  --  3.60  HGB 12.4 13.3  HCT 37.3 40  MCV 85.2  --   PLT 209 253   Lipid Panel: No results for input(s): CHOL, HDL, LDLCALC, TRIG, CHOLHDL, LDLDIRECT in the last 8760 hours. Lab Results  Component Value Date   HGBA1C 6.3 01/15/2023    Procedures since last visit: No results found.  Assessment/Plan        Osteoarthritis of the left hip Chronic osteoarthritis with manageable pain. Advised to switch from Aleve to Tylenol  for safety. - Continue swimming and golf as tolerated. - Use Tylenol  for pain management instead of Aleve. - Limit Aleve use to once a week and avoid taking it on an empty stomach.  Diverticulosis of colon Chronic diverticulosis managed with Metamucil. Regular bowel movements maintained with Metamucil. - Continue Metamucil for bowel regulation.  Urinary incontinence Mild incontinence managed with pads.. - Ordered urine test to check for hematuria. - If urine test is clear, no urologist follow-up needed. - If urine test shows hematuria, will refer to urologist.  Visual impairment Progressive impairment affecting driving ability. Regular eye doctor follow-ups advised. - Continue regular follow-ups with eye  doctor every three months.  General Health Maintenance Vaccinations up to date except tetanus and RSV. Normal bone density and blood tests. - Will administer tetanus and RSV vaccinations at pharmacy. - Will schedule blood test before next visit in six months.       Labs/tests ordered:  UA now  Labs ordered for next visit Next appt:  Visit date not found

## 2024-09-27 NOTE — Patient Instructions (Signed)
 You need TDAP and RSV shots

## 2025-02-16 ENCOUNTER — Encounter (INDEPENDENT_AMBULATORY_CARE_PROVIDER_SITE_OTHER): Admitting: Ophthalmology

## 2025-03-28 ENCOUNTER — Encounter: Admitting: Internal Medicine
